# Patient Record
Sex: Female | Born: 1945 | Race: Black or African American | Hispanic: No | Marital: Married | Smoking: Former smoker
Health system: Southern US, Community
[De-identification: ages and names within clinical notes are randomized; demographics above are authoritative.]

## PROBLEM LIST (undated history)

## (undated) DIAGNOSIS — R251 Tremor, unspecified: Secondary | ICD-10-CM

## (undated) DIAGNOSIS — D649 Anemia, unspecified: Secondary | ICD-10-CM

## (undated) DIAGNOSIS — I509 Heart failure, unspecified: Secondary | ICD-10-CM

## (undated) DIAGNOSIS — E213 Hyperparathyroidism, unspecified: Secondary | ICD-10-CM

## (undated) DIAGNOSIS — F32A Depression, unspecified: Secondary | ICD-10-CM

## (undated) DIAGNOSIS — R002 Palpitations: Secondary | ICD-10-CM

## (undated) DIAGNOSIS — R0601 Orthopnea: Secondary | ICD-10-CM

## (undated) DIAGNOSIS — R0600 Dyspnea, unspecified: Secondary | ICD-10-CM

## (undated) DIAGNOSIS — R062 Wheezing: Secondary | ICD-10-CM

## (undated) DIAGNOSIS — F0390 Unspecified dementia without behavioral disturbance: Secondary | ICD-10-CM

## (undated) DIAGNOSIS — G2581 Restless legs syndrome: Secondary | ICD-10-CM

## (undated) DIAGNOSIS — K759 Inflammatory liver disease, unspecified: Secondary | ICD-10-CM

## (undated) DIAGNOSIS — K219 Gastro-esophageal reflux disease without esophagitis: Secondary | ICD-10-CM

## (undated) DIAGNOSIS — R609 Edema, unspecified: Secondary | ICD-10-CM

## (undated) DIAGNOSIS — M4800 Spinal stenosis, site unspecified: Secondary | ICD-10-CM

## (undated) DIAGNOSIS — E039 Hypothyroidism, unspecified: Secondary | ICD-10-CM

## (undated) DIAGNOSIS — R011 Cardiac murmur, unspecified: Secondary | ICD-10-CM

## (undated) DIAGNOSIS — F329 Major depressive disorder, single episode, unspecified: Secondary | ICD-10-CM

## (undated) DIAGNOSIS — M109 Gout, unspecified: Secondary | ICD-10-CM

## (undated) DIAGNOSIS — K5792 Diverticulitis of intestine, part unspecified, without perforation or abscess without bleeding: Secondary | ICD-10-CM

## (undated) DIAGNOSIS — N184 Chronic kidney disease, stage 4 (severe): Secondary | ICD-10-CM

## (undated) DIAGNOSIS — I2510 Atherosclerotic heart disease of native coronary artery without angina pectoris: Secondary | ICD-10-CM

## (undated) DIAGNOSIS — I209 Angina pectoris, unspecified: Secondary | ICD-10-CM

## (undated) DIAGNOSIS — M1990 Unspecified osteoarthritis, unspecified site: Secondary | ICD-10-CM

## (undated) DIAGNOSIS — R52 Pain, unspecified: Secondary | ICD-10-CM

## (undated) DIAGNOSIS — R569 Unspecified convulsions: Secondary | ICD-10-CM

## (undated) DIAGNOSIS — E785 Hyperlipidemia, unspecified: Secondary | ICD-10-CM

## (undated) DIAGNOSIS — I10 Essential (primary) hypertension: Secondary | ICD-10-CM

## (undated) DIAGNOSIS — K5790 Diverticulosis of intestine, part unspecified, without perforation or abscess without bleeding: Secondary | ICD-10-CM

## (undated) DIAGNOSIS — R1310 Dysphagia, unspecified: Secondary | ICD-10-CM

## (undated) HISTORY — PX: SHOULDER SURGERY: SHX246

## (undated) HISTORY — PX: ABDOMINAL HYSTERECTOMY: SHX81

## (undated) HISTORY — PX: EYE SURGERY: SHX253

## (undated) HISTORY — PX: CHOLECYSTECTOMY: SHX55

## (undated) HISTORY — PX: JOINT REPLACEMENT: SHX530

## (undated) HISTORY — PX: CHEST SURGERY: SHX595

## (undated) HISTORY — PX: CORONARY ANGIOPLASTY: SHX604

---

## 2015-07-19 ENCOUNTER — Other Ambulatory Visit: Payer: Self-pay | Admitting: Geriatric Medicine

## 2015-07-19 DIAGNOSIS — R519 Headache, unspecified: Secondary | ICD-10-CM

## 2015-07-19 DIAGNOSIS — R51 Headache: Principal | ICD-10-CM

## 2015-08-08 ENCOUNTER — Ambulatory Visit
Admission: RE | Admit: 2015-08-08 | Discharge: 2015-08-08 | Disposition: A | Discharge status: Home / Self Care | Payer: Medicare (Managed Care) | Source: Ambulatory Visit | Attending: Geriatric Medicine | Admitting: Geriatric Medicine

## 2015-09-12 ENCOUNTER — Ambulatory Visit
Admission: RE | Admit: 2015-09-12 | Discharge: 2015-09-12 | Disposition: A | Discharge status: Home / Self Care | Payer: Medicare (Managed Care) | Source: Ambulatory Visit | Attending: Geriatric Medicine | Admitting: Geriatric Medicine

## 2015-09-12 ENCOUNTER — Other Ambulatory Visit: Payer: Self-pay | Admitting: Geriatric Medicine

## 2015-09-12 DIAGNOSIS — I6782 Cerebral ischemia: Secondary | ICD-10-CM | POA: Diagnosis not present

## 2015-09-12 DIAGNOSIS — R519 Headache, unspecified: Secondary | ICD-10-CM

## 2015-09-12 DIAGNOSIS — R51 Headache: Principal | ICD-10-CM

## 2015-09-27 ENCOUNTER — Other Ambulatory Visit: Payer: Self-pay | Admitting: Geriatric Medicine

## 2015-09-27 DIAGNOSIS — N184 Chronic kidney disease, stage 4 (severe): Secondary | ICD-10-CM

## 2015-10-06 ENCOUNTER — Other Ambulatory Visit: Payer: Self-pay | Admitting: Geriatric Medicine

## 2015-10-06 DIAGNOSIS — Z0000 Encounter for general adult medical examination without abnormal findings: Secondary | ICD-10-CM

## 2015-10-17 ENCOUNTER — Ambulatory Visit
Admission: RE | Admit: 2015-10-17 | Discharge: 2015-10-17 | Disposition: A | Discharge status: Home / Self Care | Payer: Medicare (Managed Care) | Source: Ambulatory Visit | Attending: Geriatric Medicine | Admitting: Geriatric Medicine

## 2015-10-17 ENCOUNTER — Other Ambulatory Visit: Payer: Self-pay | Admitting: Geriatric Medicine

## 2015-10-17 DIAGNOSIS — N281 Cyst of kidney, acquired: Secondary | ICD-10-CM | POA: Diagnosis not present

## 2015-10-17 DIAGNOSIS — N184 Chronic kidney disease, stage 4 (severe): Secondary | ICD-10-CM | POA: Diagnosis not present

## 2015-11-30 ENCOUNTER — Ambulatory Visit
Admission: RE | Admit: 2015-11-30 | Discharge: 2015-11-30 | Disposition: A | Discharge status: Home / Self Care | Payer: Medicare (Managed Care) | Source: Ambulatory Visit | Attending: Geriatric Medicine | Admitting: Geriatric Medicine

## 2015-11-30 DIAGNOSIS — Z1231 Encounter for screening mammogram for malignant neoplasm of breast: Secondary | ICD-10-CM | POA: Diagnosis not present

## 2015-11-30 DIAGNOSIS — Z0000 Encounter for general adult medical examination without abnormal findings: Secondary | ICD-10-CM

## 2015-12-05 ENCOUNTER — Other Ambulatory Visit: Payer: Self-pay | Admitting: Geriatric Medicine

## 2015-12-05 DIAGNOSIS — E049 Nontoxic goiter, unspecified: Secondary | ICD-10-CM

## 2015-12-28 ENCOUNTER — Ambulatory Visit
Admission: RE | Admit: 2015-12-28 | Discharge: 2015-12-28 | Disposition: A | Discharge status: Home / Self Care | Payer: Medicare (Managed Care) | Source: Ambulatory Visit | Attending: Geriatric Medicine | Admitting: Geriatric Medicine

## 2015-12-28 DIAGNOSIS — I69391 Dysphagia following cerebral infarction: Secondary | ICD-10-CM | POA: Insufficient documentation

## 2015-12-28 DIAGNOSIS — E079 Disorder of thyroid, unspecified: Secondary | ICD-10-CM | POA: Insufficient documentation

## 2015-12-28 DIAGNOSIS — E049 Nontoxic goiter, unspecified: Secondary | ICD-10-CM

## 2016-01-04 ENCOUNTER — Encounter: Payer: Self-pay | Admitting: Emergency Medicine

## 2016-01-04 ENCOUNTER — Observation Stay
Admission: EM | Admit: 2016-01-04 | Discharge: 2016-01-06 | Disposition: A | Discharge status: Home / Self Care | Payer: Medicare (Managed Care) | Attending: Internal Medicine | Admitting: Internal Medicine

## 2016-01-04 ENCOUNTER — Emergency Department: Payer: Medicare (Managed Care)

## 2016-01-04 DIAGNOSIS — Z8249 Family history of ischemic heart disease and other diseases of the circulatory system: Secondary | ICD-10-CM | POA: Insufficient documentation

## 2016-01-04 DIAGNOSIS — Z809 Family history of malignant neoplasm, unspecified: Secondary | ICD-10-CM | POA: Insufficient documentation

## 2016-01-04 DIAGNOSIS — M542 Cervicalgia: Secondary | ICD-10-CM | POA: Diagnosis not present

## 2016-01-04 DIAGNOSIS — R531 Weakness: Secondary | ICD-10-CM | POA: Diagnosis not present

## 2016-01-04 DIAGNOSIS — G40909 Epilepsy, unspecified, not intractable, without status epilepticus: Secondary | ICD-10-CM | POA: Diagnosis not present

## 2016-01-04 DIAGNOSIS — R109 Unspecified abdominal pain: Secondary | ICD-10-CM

## 2016-01-04 DIAGNOSIS — Z7982 Long term (current) use of aspirin: Secondary | ICD-10-CM | POA: Insufficient documentation

## 2016-01-04 DIAGNOSIS — R55 Syncope and collapse: Secondary | ICD-10-CM | POA: Diagnosis not present

## 2016-01-04 DIAGNOSIS — I2510 Atherosclerotic heart disease of native coronary artery without angina pectoris: Secondary | ICD-10-CM | POA: Diagnosis not present

## 2016-01-04 DIAGNOSIS — K5792 Diverticulitis of intestine, part unspecified, without perforation or abscess without bleeding: Secondary | ICD-10-CM | POA: Diagnosis not present

## 2016-01-04 DIAGNOSIS — Z79899 Other long term (current) drug therapy: Secondary | ICD-10-CM | POA: Insufficient documentation

## 2016-01-04 DIAGNOSIS — M4800 Spinal stenosis, site unspecified: Secondary | ICD-10-CM | POA: Diagnosis not present

## 2016-01-04 DIAGNOSIS — R000 Tachycardia, unspecified: Secondary | ICD-10-CM | POA: Insufficient documentation

## 2016-01-04 DIAGNOSIS — Z841 Family history of disorders of kidney and ureter: Secondary | ICD-10-CM | POA: Insufficient documentation

## 2016-01-04 DIAGNOSIS — Z9049 Acquired absence of other specified parts of digestive tract: Secondary | ICD-10-CM | POA: Insufficient documentation

## 2016-01-04 DIAGNOSIS — R05 Cough: Secondary | ICD-10-CM | POA: Insufficient documentation

## 2016-01-04 DIAGNOSIS — F0390 Unspecified dementia without behavioral disturbance: Secondary | ICD-10-CM | POA: Diagnosis not present

## 2016-01-04 DIAGNOSIS — R809 Proteinuria, unspecified: Secondary | ICD-10-CM | POA: Insufficient documentation

## 2016-01-04 DIAGNOSIS — R110 Nausea: Secondary | ICD-10-CM

## 2016-01-04 DIAGNOSIS — N184 Chronic kidney disease, stage 4 (severe): Secondary | ICD-10-CM | POA: Diagnosis not present

## 2016-01-04 DIAGNOSIS — R1310 Dysphagia, unspecified: Secondary | ICD-10-CM | POA: Diagnosis not present

## 2016-01-04 DIAGNOSIS — N19 Unspecified kidney failure: Secondary | ICD-10-CM | POA: Diagnosis present

## 2016-01-04 DIAGNOSIS — E213 Hyperparathyroidism, unspecified: Secondary | ICD-10-CM | POA: Insufficient documentation

## 2016-01-04 DIAGNOSIS — Z888 Allergy status to other drugs, medicaments and biological substances status: Secondary | ICD-10-CM | POA: Insufficient documentation

## 2016-01-04 DIAGNOSIS — R918 Other nonspecific abnormal finding of lung field: Secondary | ICD-10-CM | POA: Insufficient documentation

## 2016-01-04 DIAGNOSIS — R001 Bradycardia, unspecified: Secondary | ICD-10-CM | POA: Diagnosis not present

## 2016-01-04 DIAGNOSIS — G8929 Other chronic pain: Secondary | ICD-10-CM | POA: Insufficient documentation

## 2016-01-04 DIAGNOSIS — Z881 Allergy status to other antibiotic agents status: Secondary | ICD-10-CM | POA: Insufficient documentation

## 2016-01-04 DIAGNOSIS — E782 Mixed hyperlipidemia: Secondary | ICD-10-CM | POA: Diagnosis not present

## 2016-01-04 DIAGNOSIS — I129 Hypertensive chronic kidney disease with stage 1 through stage 4 chronic kidney disease, or unspecified chronic kidney disease: Secondary | ICD-10-CM | POA: Diagnosis not present

## 2016-01-04 DIAGNOSIS — M5136 Other intervertebral disc degeneration, lumbar region: Secondary | ICD-10-CM | POA: Insufficient documentation

## 2016-01-04 DIAGNOSIS — M109 Gout, unspecified: Secondary | ICD-10-CM | POA: Insufficient documentation

## 2016-01-04 DIAGNOSIS — I081 Rheumatic disorders of both mitral and tricuspid valves: Secondary | ICD-10-CM | POA: Insufficient documentation

## 2016-01-04 DIAGNOSIS — E875 Hyperkalemia: Secondary | ICD-10-CM | POA: Diagnosis present

## 2016-01-04 DIAGNOSIS — Z823 Family history of stroke: Secondary | ICD-10-CM | POA: Insufficient documentation

## 2016-01-04 DIAGNOSIS — E785 Hyperlipidemia, unspecified: Secondary | ICD-10-CM | POA: Diagnosis not present

## 2016-01-04 DIAGNOSIS — I10 Essential (primary) hypertension: Secondary | ICD-10-CM | POA: Diagnosis present

## 2016-01-04 DIAGNOSIS — K219 Gastro-esophageal reflux disease without esophagitis: Secondary | ICD-10-CM | POA: Diagnosis not present

## 2016-01-04 DIAGNOSIS — Z90710 Acquired absence of both cervix and uterus: Secondary | ICD-10-CM | POA: Insufficient documentation

## 2016-01-04 HISTORY — DX: Essential (primary) hypertension: I10

## 2016-01-04 HISTORY — DX: Spinal stenosis, site unspecified: M48.00

## 2016-01-04 HISTORY — DX: Chronic kidney disease, stage 4 (severe): N18.4

## 2016-01-04 HISTORY — DX: Unspecified dementia, unspecified severity, without behavioral disturbance, psychotic disturbance, mood disturbance, and anxiety: F03.90

## 2016-01-04 HISTORY — DX: Hyperparathyroidism, unspecified: E21.3

## 2016-01-04 HISTORY — DX: Gout, unspecified: M10.9

## 2016-01-04 HISTORY — DX: Dysphagia, unspecified: R13.10

## 2016-01-04 HISTORY — DX: Atherosclerotic heart disease of native coronary artery without angina pectoris: I25.10

## 2016-01-04 HISTORY — DX: Diverticulitis of intestine, part unspecified, without perforation or abscess without bleeding: K57.92

## 2016-01-04 HISTORY — DX: Diverticulosis of intestine, part unspecified, without perforation or abscess without bleeding: K57.90

## 2016-01-04 HISTORY — DX: Anemia, unspecified: D64.9

## 2016-01-04 HISTORY — DX: Hyperlipidemia, unspecified: E78.5

## 2016-01-04 HISTORY — DX: Unspecified convulsions: R56.9

## 2016-01-04 LAB — URINALYSIS COMPLETE WITH MICROSCOPIC (ARMC ONLY)
BILIRUBIN URINE: NEGATIVE
GLUCOSE, UA: NEGATIVE mg/dL
Hgb urine dipstick: NEGATIVE
KETONES UR: NEGATIVE mg/dL
Leukocytes, UA: NEGATIVE
NITRITE: NEGATIVE
PROTEIN: 100 mg/dL — AB
SPECIFIC GRAVITY, URINE: 1.01 (ref 1.005–1.030)
pH: 6 (ref 5.0–8.0)

## 2016-01-04 LAB — COMPREHENSIVE METABOLIC PANEL
ALK PHOS: 113 U/L (ref 38–126)
ALT: 9 U/L — ABNORMAL LOW (ref 14–54)
ANION GAP: 7 (ref 5–15)
AST: 19 U/L (ref 15–41)
Albumin: 3.8 g/dL (ref 3.5–5.0)
BILIRUBIN TOTAL: 0.4 mg/dL (ref 0.3–1.2)
BUN: 43 mg/dL — ABNORMAL HIGH (ref 6–20)
CALCIUM: 8.5 mg/dL — AB (ref 8.9–10.3)
CO2: 23 mmol/L (ref 22–32)
Chloride: 109 mmol/L (ref 101–111)
Creatinine, Ser: 2.45 mg/dL — ABNORMAL HIGH (ref 0.44–1.00)
GFR calc non Af Amer: 19 mL/min — ABNORMAL LOW (ref >60)
GFR, EST AFRICAN AMERICAN: 22 mL/min — AB (ref >60)
Glucose, Bld: 88 mg/dL (ref 65–99)
POTASSIUM: 6.3 mmol/L — AB (ref 3.5–5.1)
SODIUM: 139 mmol/L (ref 135–145)
TOTAL PROTEIN: 7.5 g/dL (ref 6.5–8.1)

## 2016-01-04 LAB — CBC WITH DIFFERENTIAL/PLATELET
Basophils Absolute: 0 10*3/uL (ref 0–0.1)
Basophils Relative: 1 %
EOS ABS: 0.2 10*3/uL (ref 0–0.7)
EOS PCT: 3 %
HCT: 27 % — ABNORMAL LOW (ref 35.0–47.0)
Hemoglobin: 8.8 g/dL — ABNORMAL LOW (ref 12.0–16.0)
LYMPHS ABS: 1.6 10*3/uL (ref 1.0–3.6)
Lymphocytes Relative: 24 %
MCH: 29.9 pg (ref 26.0–34.0)
MCHC: 32.8 g/dL (ref 32.0–36.0)
MCV: 91.4 fL (ref 80.0–100.0)
MONOS PCT: 9 %
Monocytes Absolute: 0.6 10*3/uL (ref 0.2–0.9)
Neutro Abs: 4.2 10*3/uL (ref 1.4–6.5)
Neutrophils Relative %: 63 %
PLATELETS: 288 10*3/uL (ref 150–440)
RBC: 2.95 MIL/uL — ABNORMAL LOW (ref 3.80–5.20)
RDW: 13.3 % (ref 11.5–14.5)
WBC: 6.6 10*3/uL (ref 3.6–11.0)

## 2016-01-04 LAB — BASIC METABOLIC PANEL
Anion gap: 7 (ref 5–15)
BUN: 42 mg/dL — ABNORMAL HIGH (ref 6–20)
CALCIUM: 8.1 mg/dL — AB (ref 8.9–10.3)
CHLORIDE: 107 mmol/L (ref 101–111)
CO2: 22 mmol/L (ref 22–32)
CREATININE: 2.32 mg/dL — AB (ref 0.44–1.00)
GFR, EST AFRICAN AMERICAN: 23 mL/min — AB (ref >60)
GFR, EST NON AFRICAN AMERICAN: 20 mL/min — AB (ref >60)
Glucose, Bld: 100 mg/dL — ABNORMAL HIGH (ref 65–99)
Potassium: 5.4 mmol/L — ABNORMAL HIGH (ref 3.5–5.1)
SODIUM: 136 mmol/L (ref 135–145)

## 2016-01-04 LAB — LIPASE, BLOOD: LIPASE: 28 U/L (ref 11–51)

## 2016-01-04 LAB — TROPONIN I

## 2016-01-04 MED ORDER — POLYETHYLENE GLYCOL 3350 17 G PO PACK: 17.0000 g | PACK | Freq: Every day | ORAL | Status: DC | PRN

## 2016-01-04 MED ORDER — SODIUM BICARBONATE 650 MG PO TABS
650.0000 mg | ORAL_TABLET | Freq: Two times a day (BID) | ORAL | Status: DC
  Administered 2016-01-05 – 2016-01-06 (×4): 650 mg via ORAL
  Filled 2016-01-04 (×4): qty 1

## 2016-01-04 MED ORDER — PROMETHAZINE HCL 25 MG/ML IJ SOLN
12.5000 mg | Freq: Once | INTRAMUSCULAR | Status: AC
  Administered 2016-01-04: 12.5 mg via INTRAVENOUS
  Filled 2016-01-04: qty 1

## 2016-01-04 MED ORDER — PANTOPRAZOLE SODIUM 40 MG PO TBEC
40.0000 mg | DELAYED_RELEASE_TABLET | Freq: Every day | ORAL | Status: DC
  Administered 2016-01-05 – 2016-01-06 (×2): 40 mg via ORAL
  Filled 2016-01-04 (×2): qty 1

## 2016-01-04 MED ORDER — SODIUM CHLORIDE 0.9% FLUSH
3.0000 mL | Freq: Two times a day (BID) | INTRAVENOUS | Status: DC
  Administered 2016-01-05 (×3): 3 mL via INTRAVENOUS

## 2016-01-04 MED ORDER — ASPIRIN EC 81 MG PO TBEC
81.0000 mg | DELAYED_RELEASE_TABLET | Freq: Every day | ORAL | Status: DC
  Administered 2016-01-05 – 2016-01-06 (×2): 81 mg via ORAL
  Filled 2016-01-04 (×2): qty 1

## 2016-01-04 MED ORDER — HEPARIN SODIUM (PORCINE) 5000 UNIT/ML IJ SOLN
5000.0000 [IU] | Freq: Three times a day (TID) | INTRAMUSCULAR | Status: DC
  Administered 2016-01-05 – 2016-01-06 (×5): 5000 [IU] via SUBCUTANEOUS
  Filled 2016-01-04 (×5): qty 1

## 2016-01-04 MED ORDER — CARVEDILOL 12.5 MG PO TABS
25.0000 mg | ORAL_TABLET | Freq: Two times a day (BID) | ORAL | Status: DC
  Administered 2016-01-05 – 2016-01-06 (×3): 25 mg via ORAL
  Filled 2016-01-04 (×3): qty 2

## 2016-01-04 MED ORDER — LEVETIRACETAM 500 MG PO TABS
500.0000 mg | ORAL_TABLET | Freq: Two times a day (BID) | ORAL | Status: DC
  Administered 2016-01-05 – 2016-01-06 (×4): 500 mg via ORAL
  Filled 2016-01-04 (×4): qty 1

## 2016-01-04 MED ORDER — IOHEXOL 240 MG/ML SOLN
25.0000 mL | Freq: Once | INTRAMUSCULAR | Status: AC | PRN
  Administered 2016-01-04: 50 mL via ORAL

## 2016-01-04 MED ORDER — PROMETHAZINE HCL 25 MG/ML IJ SOLN: 12.5000 mg | Freq: Once | INTRAMUSCULAR | Status: DC

## 2016-01-04 MED ORDER — AMLODIPINE BESYLATE 5 MG PO TABS
5.0000 mg | ORAL_TABLET | Freq: Every day | ORAL | Status: DC
  Administered 2016-01-05 – 2016-01-06 (×2): 5 mg via ORAL
  Filled 2016-01-04 (×2): qty 1

## 2016-01-04 MED ORDER — SODIUM CHLORIDE 0.9 % IV BOLUS (SEPSIS)
1000.0000 mL | Freq: Once | INTRAVENOUS | Status: AC
  Administered 2016-01-04: 1000 mL via INTRAVENOUS

## 2016-01-04 MED ORDER — MORPHINE SULFATE (PF) 4 MG/ML IV SOLN
4.0000 mg | Freq: Once | INTRAVENOUS | Status: AC
  Administered 2016-01-04: 4 mg via INTRAVENOUS
  Filled 2016-01-04: qty 1

## 2016-01-04 MED ORDER — ACETAMINOPHEN 650 MG RE SUPP: 650.0000 mg | Freq: Four times a day (QID) | RECTAL | Status: DC | PRN

## 2016-01-04 MED ORDER — SODIUM POLYSTYRENE SULFONATE 15 GM/60ML PO SUSP
30.0000 g | Freq: Once | ORAL | Status: AC
  Administered 2016-01-04: 30 g via ORAL
  Filled 2016-01-04: qty 120

## 2016-01-04 MED ORDER — LISINOPRIL 5 MG PO TABS
2.5000 mg | ORAL_TABLET | Freq: Every day | ORAL | Status: DC
  Administered 2016-01-05: 2.5 mg via ORAL
  Filled 2016-01-04: qty 1

## 2016-01-04 MED ORDER — SENNOSIDES-DOCUSATE SODIUM 8.6-50 MG PO TABS: 2.0000 | ORAL_TABLET | Freq: Every day | ORAL | Status: DC | PRN

## 2016-01-04 MED ORDER — SODIUM CHLORIDE 0.9 % IV BOLUS (SEPSIS): 500.0000 mL | Freq: Once | INTRAVENOUS | Status: DC

## 2016-01-04 MED ORDER — ATORVASTATIN CALCIUM 20 MG PO TABS
20.0000 mg | ORAL_TABLET | Freq: Every day | ORAL | Status: DC
  Administered 2016-01-05: 20 mg via ORAL
  Filled 2016-01-04: qty 1

## 2016-01-04 MED ORDER — ISOSORBIDE MONONITRATE ER 30 MG PO TB24
30.0000 mg | ORAL_TABLET | Freq: Every day | ORAL | Status: DC
  Administered 2016-01-05 – 2016-01-06 (×2): 30 mg via ORAL
  Filled 2016-01-04 (×2): qty 1

## 2016-01-04 MED ORDER — DULOXETINE HCL 30 MG PO CPEP
30.0000 mg | ORAL_CAPSULE | Freq: Every day | ORAL | Status: DC
  Administered 2016-01-05 – 2016-01-06 (×2): 30 mg via ORAL
  Filled 2016-01-04 (×2): qty 1

## 2016-01-04 MED ORDER — OXYCODONE HCL 5 MG PO TABS
5.0000 mg | ORAL_TABLET | Freq: Four times a day (QID) | ORAL | Status: DC | PRN
  Administered 2016-01-05 (×2): 5 mg via ORAL
  Filled 2016-01-04 (×2): qty 1

## 2016-01-04 MED ORDER — FEBUXOSTAT 40 MG PO TABS
40.0000 mg | ORAL_TABLET | Freq: Every day | ORAL | Status: DC
  Administered 2016-01-05 – 2016-01-06 (×2): 40 mg via ORAL
  Filled 2016-01-04 (×2): qty 1

## 2016-01-04 MED ORDER — PROMETHAZINE HCL 25 MG/ML IJ SOLN
12.5000 mg | Freq: Once | INTRAMUSCULAR | Status: AC
  Administered 2016-01-04: 12.5 mg via INTRAMUSCULAR
  Filled 2016-01-04: qty 1

## 2016-01-04 MED ORDER — ACETAMINOPHEN 325 MG PO TABS
650.0000 mg | ORAL_TABLET | Freq: Four times a day (QID) | ORAL | Status: DC | PRN
  Administered 2016-01-06: 650 mg via ORAL
  Filled 2016-01-04: qty 2

## 2016-01-04 NOTE — ED Notes (Signed)
Attempted PIV x 2, unsuccessful.  Dr. Edd Fabian alerted.  Charge nurse at bedside looking for vein.

## 2016-01-04 NOTE — ED Notes (Signed)
Ambulated to BR with minimal assist.  Patient tolerated well.

## 2016-01-04 NOTE — ED Provider Notes (Signed)
[facility] Emergency Department Provider Note  ____________________________________________  Time seen: Approximately 3:52 PM  I have reviewed the triage vital signs and the nursing notes.   HISTORY  Chief Complaint Weakness and Nausea    HPI Sarah Hamilton is a 70 y.o. female with history of chronic kidney disease, dementia, coronary artery disease, hypertension, seizures who presents for evaluation of one day of nausea, gradual onset, constant since onset, currently severe, no modifying factors. She has also had right-sided abdominal pain since yesterday as well as epigastric abdominal pain. She was apparently at pace this morning and reported that she started feeling hot all over, was trying to take off her sweater and yelled for help because she "knew something was wrong". According to EMS, staff at [facility] program witnessed her have a near-syncopal event and her heart rate was 32 during the event. On EMS arrival, her vital signs were within normal limits. Patient denies any chest pain or difficulty breathing. She has had subjective fever and chills. No diarrhea. Mild dysuria.   Past Medical History  Diagnosis Date  . Hyperparathyroidism ([place])   . Dysphagia   . CKD (chronic kidney disease), stage IV ([place])   . Dementia   . Spinal stenosis   . Diverticulitis   . Diverticulosis   . Hyperlipidemia   . Anemia   . Seizures ([place])   . Coronary artery disease   . Hypertension   . Gout     There are no active problems to display for this patient.   No past surgical history on file.  Current Outpatient Rx  Name  Route  Sig  Dispense  Refill  . amLODipine (NORVASC) 5 MG tablet   Oral   Take 5 mg by mouth daily.         Marland Kitchen aspirin EC 81 MG tablet   Oral   Take 81 mg by mouth daily.         Marland Kitchen atorvastatin (LIPITOR) 20 MG tablet   Oral   Take 20 mg by mouth at bedtime.         . camphor-menthol (SARNA) lotion   Topical   Apply 1 application  topically 2 (two) times daily as needed for itching.         . capsaicin (ZOSTRIX) 0.025 % cream   Topical   Apply 1 application topically at bedtime as needed.         . carvedilol (COREG) 25 MG tablet   Oral   Take 25 mg by mouth 2 (two) times daily.         . Cranberry-Vitamin C-Probiotic (AZO CRANBERRY) 250-30 MG TABS   Oral   Take 2 capsules by mouth daily.         . diclofenac sodium (VOLTAREN) 1 % GEL   Topical   Apply 1 application topically 4 (four) times daily as needed.         . DULoxetine (CYMBALTA) 30 MG capsule   Oral   Take 1 capsule by mouth daily.         . febuxostat (ULORIC) 40 MG tablet   Oral   Take 40 mg by mouth daily.         Marland Kitchen gabapentin (NEURONTIN) 100 MG capsule   Oral   Take 1 capsule by mouth 2 (two) times daily.         . isosorbide mononitrate (IMDUR) 30 MG 24 hr tablet   Oral   Take 30 mg by mouth  daily.         . levETIRAcetam (KEPPRA) 500 MG tablet   Oral   Take 500 mg by mouth 2 (two) times daily.         Marland Kitchen lisinopril (PRINIVIL,ZESTRIL) 2.5 MG tablet   Oral   Take 2.5 mg by mouth daily.         Marland Kitchen losartan (COZAAR) 25 MG tablet   Oral   Take 25 mg by mouth daily.         . nitroGLYCERIN (NITROSTAT) 0.4 MG SL tablet   Sublingual   Place 0.4 mg under the tongue every 5 (five) minutes as needed.         . nystatin cream (MYCOSTATIN)   Topical   Apply 1 application topically 2 (two) times daily.         Marland Kitchen oxyCODONE-acetaminophen (PERCOCET/ROXICET) 5-325 MG tablet   Oral   Take 2 tablets by mouth 2 (two) times daily as needed.         . pantoprazole (PROTONIX) 40 MG tablet   Oral   Take 40 mg by mouth daily.         . polyethylene glycol (MIRALAX / GLYCOLAX) packet   Oral   Take 17 g by mouth daily as needed.         . promethazine (PHENERGAN) 12.5 MG tablet   Oral   Take 12.5 mg by mouth every 6 (six) hours as needed.         . senna-docusate (SENOKOT-S) 8.6-50 MG tablet   Oral    Take 2 tablets by mouth daily as needed.         . sodium bicarbonate 325 MG tablet   Oral   Take 650 mg by mouth 2 (two) times daily.         . Vitamin D, Ergocalciferol, (DRISDOL) 50000 units CAPS capsule   Oral   Take 1 capsule by mouth once a week. On mondays           Allergies Pregabalin and Erythromycin  Family History  Problem Relation Age of Onset  . Breast cancer Neg Hx     Social History Social History  Substance Use Topics  . Smoking status: Never Smoker   . Smokeless tobacco: None  . Alcohol Use: None    Review of Systems Constitutional: +subjectibr fever/chills Eyes: No visual changes. ENT: No sore throat. Cardiovascular: Denies chest pain. Respiratory: Denies shortness of breath. Gastrointestinal: + abdominal pain.  +nausea, no vomiting.  No diarrhea.  No constipation. Genitourinary: Negative for dysuria. Musculoskeletal: Negative for back pain. Skin: Negative for rash. Neurological: Negative for headaches, focal weakness or numbness.  10-point ROS otherwise negative.  ____________________________________________   PHYSICAL EXAM:  VITAL SIGNS: ED Triage Vitals  Enc Vitals Group     BP 01/04/16 1543 103/62 mmHg     Pulse Rate 01/04/16 1543 66     Resp 01/04/16 1543 16     Temp 01/04/16 1543 97.9 F (36.6 C)     Temp Source 01/04/16 1543 Oral     SpO2 01/04/16 1543 97 %     Weight 01/04/16 1543 282 lb 9.6 oz (128.187 kg)     Height 01/04/16 1543 5\' 2"  (1.575 m)     Head Cir --      Peak Flow --      Pain Score 01/04/16 1543 0     Pain Loc --      Pain Edu? --  Excl. in [place]? --     Constitutional: Alert and oriented. Nontoxic-appearing and in no acute distress. Eyes: Conjunctivae are normal. PERRL. EOMI. Head: Atraumatic. Nose: No congestion/rhinnorhea. Mouth/Throat: Mucous membranes are moist.  Oropharynx non-erythematous. Neck: No stridor. Supple without meningismus. Cardiovascular: Normal rate, regular rhythm. Grossly  normal heart sounds.  Good peripheral circulation. Respiratory: Normal respiratory effort.  No retractions. Lungs CTAB. Gastrointestinal: Soft with tenderness to palpation all throughout the right abdomen. No CVA tenderness. Genitourinary: deferred Musculoskeletal: No lower extremity tenderness nor edema.  No joint effusions. Neurologic:  Normal speech and language. No gross focal neurologic deficits are appreciated.  Skin:  Skin is warm, dry and intact. No rash noted. Psychiatric: Mood and affect are normal. Speech and behavior are normal.  ____________________________________________   LABS (all labs ordered are listed, but only abnormal results are displayed)  Labs Reviewed  CBC WITH DIFFERENTIAL/PLATELET - Abnormal; Notable for the following:    RBC 2.95 (*)    Hemoglobin 8.8 (*)    HCT 27.0 (*)    All other components within normal limits  COMPREHENSIVE METABOLIC PANEL - Abnormal; Notable for the following:    Potassium 6.3 (*)    BUN 43 (*)    Creatinine, Ser 2.45 (*)    Calcium 8.5 (*)    ALT 9 (*)    GFR calc non Af Amer 19 (*)    GFR calc Af Amer 22 (*)    All other components within normal limits  TROPONIN I  LIPASE, BLOOD  URINALYSIS COMPLETEWITH MICROSCOPIC ([facility] ONLY)  BASIC METABOLIC PANEL   ____________________________________________  EKG  ED ECG REPORT I, Joanne Gavel, the attending physician, personally viewed and interpreted this ECG.   Date: 01/04/2016  EKG Time: 16:49  Rate: 62  Rhythm: normal sinus rhythm  Axis: normal  Intervals:none  ST&T Change: No acute ST elevation. Motion artifact.  ____________________________________________  RADIOLOGY  CXR IMPRESSION: 1. Mild enlargement of the cardiopericardial silhouette. There is upper zone pulmonary vascular prominence which could reflect pulmonary venous hypertension. Faint interstitial accentuation, query mild interstitial edema. 2. Mid thoracic wedge compression, most likely  chronic.   CT abdomen and pelvis IMPRESSION: 1. No acute abnormality of the abdomen and pelvis. 2. Normal appendix. 3. Colonic diverticulosis. 4. Status post cholecystectomy and hysterectomy. 5. Coronary artery disease. 6. Atherosclerosis of the abdominal aorta. 7. Distention of the urinary bladder. 8. Degenerative disc disease. 9. Chronic superior endplate fracture of L1. ____________________________________________   PROCEDURES  Procedure(s) performed: None  Critical Care performed: No  ____________________________________________   INITIAL IMPRESSION / ASSESSMENT AND PLAN / ED COURSE  Pertinent labs & imaging results that were available during my care of the patient were reviewed by me and considered in my medical decision making (see chart for details).  Sarah Hamilton is a 70 y.o. female with history of chronic kidney disease, dementia, coronary artery disease, hypertension, seizures who presents for evaluation of one day of nausea as well as right-sided abdominal pain. She also had an episode today where she nearly fainted, likely vasovagal presyncope given significant preceding prodrome. On exam, she is nontoxic appearing and in no acute distress. Vital signs stable, she is afebrile. She does have tenderness to palpation all throughout the right abdomen. Plan for screening labs, urinalysis, chest x-ray given her complaint of subjective fevers and chills, likely CT of the abdomen and pelvis. We'll treat her nausea and discomfort as needed. Reassess for disposition.  ----------------------------------------- 6:47 PM on 01/04/2016 ----------------------------------------- Labs reviewed. CBC notable  for anemia with hemoglobin 8.8. CMP with creatinine elevated at 2.45 which is worsening when compared to her baseline. Potassium is elevated at 6.3. No acute hyperkalemic EKG changes. We'll give IV fluids. We'll not give Kayexalate until CT of the abdomen and pelvis is available  and obstruction is ruled out. Troponin negative, normal lipase. I discussed the case with Dr. Sherlon Handing of nephrology.  ----------------------------------------- 9:20 PM on 01/04/2016 ----------------------------------------- Negative CT of the abdomen and pelvis. We'll give Kayexalate and discuss case with the hospitalist, Dr. Jannifer Franklin, for admission.  ____________________________________________   FINAL CLINICAL IMPRESSION(S) / ED DIAGNOSES  Final diagnoses:  Abdominal pain, unspecified abdominal location  Nausea  Vasovagal near syncope  Acute hyperkalemia  Renal failure      Joanne Gavel, MD 01/04/16 2123

## 2016-01-04 NOTE — ED Notes (Signed)
Patient c/o nausea x 1 day.  States she was feeling more nauseous this morning when she went to [facility] today.  EMS was called due to paitent having a "near syncopal" event.  Staff reported that patient's heart rate dropped down to 32.  Vital signs and heart rate were stable for EMS

## 2016-01-04 NOTE — H&P (Signed)
[facility] [place] NAME: Sarah Hamilton    MR#:  [id number]  DATE OF BIRTH:  Aug 03, 1946  DATE OF ADMISSION:  01/04/2016  PRIMARY CARE PHYSICIAN: No PCP Per Patient   REQUESTING/REFERRING PHYSICIAN: Edd Fabian, MD  CHIEF COMPLAINT:   Chief Complaint  Patient presents with  . Weakness  . Nausea    HISTORY OF PRESENT ILLNESS:  Sarah Hamilton  is a 70 y.o. female who presents after a syncopal episode. Patient was at a [facility] program today after a visit with the dentist for tooth extractions. She became acutely flushed, nauseated, felt very hot, was trying her sweater off and syncopized. Heart rate measured during the event showed her to be bradycardic in the 30s. She woke up after a brief period, did not have any postictal state, did not have any reported seizure activity during the event. She was brought to the hospital for evaluation. Her nausea has been persistent and started, improved only mildly by Zofran here in the ED. In the ED she was found to be hyperkalemic with a potassium of 6.3. She was treated with Kayexalate with potassium following to 5.4. Patient has known stage IV chronic kidney disease, follows with nephrology, is not on dialysis, still makes good urine. No urinary symptoms. Hospitals were called for admission.  PAST MEDICAL HISTORY:   Past Medical History  Diagnosis Date  . Hyperparathyroidism ([place])   . Dysphagia   . CKD (chronic kidney disease), stage IV ([place])   . Dementia   . Spinal stenosis   . Diverticulitis   . Diverticulosis   . Hyperlipidemia   . Anemia   . Seizures ([place])   . Coronary artery disease   . Hypertension   . Gout     PAST SURGICAL HISTORY:   Past Surgical History  Procedure Laterality Date  . Cholecystectomy    . Abdominal hysterectomy    . Shoulder surgery    . Joint replacement      SOCIAL HISTORY:   Social History  Substance Use Topics  . Smoking status: Never Smoker   .  Smokeless tobacco: Not on file  . Alcohol Use: No    FAMILY HISTORY:   Family History  Problem Relation Age of Onset  . CAD Mother   . Hypertension Mother   . Stroke Mother   . Kidney failure Mother   . Cancer Father   . Kidney failure Brother   . Kidney failure Son     DRUG ALLERGIES:   Allergies  Allergen Reactions  . Pregabalin     Other reaction(s): Unknown  . Erythromycin     MEDICATIONS AT HOME:   Prior to Admission medications   Medication Sig Start Date End Date Taking? Authorizing Provider  amLODipine (NORVASC) 5 MG tablet Take 5 mg by mouth daily. 05/12/14  Yes Historical Provider, MD  aspirin EC 81 MG tablet Take 81 mg by mouth daily.   Yes Historical Provider, MD  atorvastatin (LIPITOR) 20 MG tablet Take 20 mg by mouth at bedtime. 07/14/14  Yes Historical Provider, MD  camphor-menthol Timoteo Ace) lotion Apply 1 application topically 2 (two) times daily as needed for itching.   Yes Historical Provider, MD  capsaicin (ZOSTRIX) 0.025 % cream Apply 1 application topically at bedtime as needed.   Yes Historical Provider, MD  carvedilol (COREG) 25 MG tablet Take 25 mg by mouth 2 (two) times daily. 11/08/14  Yes Historical Provider, MD  Cranberry-Vitamin C-Probiotic (AZO CRANBERRY) 250-30  MG TABS Take 2 capsules by mouth daily.   Yes Historical Provider, MD  diclofenac sodium (VOLTAREN) 1 % GEL Apply 1 application topically 4 (four) times daily as needed. 08/17/15  Yes Historical Provider, MD  DULoxetine (CYMBALTA) 30 MG capsule Take 1 capsule by mouth daily.   Yes Historical Provider, MD  febuxostat (ULORIC) 40 MG tablet Take 40 mg by mouth daily. 11/16/14  Yes Historical Provider, MD  gabapentin (NEURONTIN) 100 MG capsule Take 1 capsule by mouth 2 (two) times daily. 05/25/15  Yes Historical Provider, MD  isosorbide mononitrate (IMDUR) 30 MG 24 hr tablet Take 30 mg by mouth daily.   Yes Historical Provider, MD  levETIRAcetam (KEPPRA) 500 MG tablet Take 500 mg by mouth 2 (two)  times daily.   Yes Historical Provider, MD  lisinopril (PRINIVIL,ZESTRIL) 2.5 MG tablet Take 2.5 mg by mouth daily.   Yes Historical Provider, MD  losartan (COZAAR) 25 MG tablet Take 25 mg by mouth daily.   Yes Historical Provider, MD  nitroGLYCERIN (NITROSTAT) 0.4 MG SL tablet Place 0.4 mg under the tongue every 5 (five) minutes as needed. 02/15/15  Yes Historical Provider, MD  nystatin cream (MYCOSTATIN) Apply 1 application topically 2 (two) times daily. 04/04/15  Yes Historical Provider, MD  oxyCODONE-acetaminophen (PERCOCET/ROXICET) 5-325 MG tablet Take 2 tablets by mouth 2 (two) times daily as needed. 12/10/14  Yes Historical Provider, MD  pantoprazole (PROTONIX) 40 MG tablet Take 40 mg by mouth daily. 01/04/15 01/04/16 Yes Historical Provider, MD  polyethylene glycol (MIRALAX / GLYCOLAX) packet Take 17 g by mouth daily as needed.   Yes Historical Provider, MD  promethazine (PHENERGAN) 12.5 MG tablet Take 12.5 mg by mouth every 6 (six) hours as needed. 11/08/14  Yes Historical Provider, MD  senna-docusate (SENOKOT-S) 8.6-50 MG tablet Take 2 tablets by mouth daily as needed. 02/27/15  Yes Historical Provider, MD  sodium bicarbonate 325 MG tablet Take 650 mg by mouth 2 (two) times daily. 09/28/15  Yes Historical Provider, MD  Vitamin D, Ergocalciferol, (DRISDOL) 50000 units CAPS capsule Take 1 capsule by mouth once a week. On mondays 12/31/13  Yes Historical Provider, MD    REVIEW OF SYSTEMS:  Review of Systems  Constitutional: Negative for fever, chills, weight loss and malaise/fatigue.  HENT: Negative for ear pain, hearing loss and tinnitus.   Eyes: Negative for blurred vision, double vision, pain and redness.  Respiratory: Negative for cough, hemoptysis and shortness of breath.   Cardiovascular: Negative for chest pain, palpitations, orthopnea and leg swelling.  Gastrointestinal: Negative for nausea, vomiting, abdominal pain, diarrhea and constipation.  Genitourinary: Negative for dysuria, frequency  and hematuria.  Musculoskeletal: Negative for back pain, joint pain and neck pain.  Skin:       No acne, rash, or lesions  Neurological: Positive for loss of consciousness. Negative for dizziness, tremors, focal weakness and weakness.  Endo/Heme/Allergies: Negative for polydipsia. Does not bruise/bleed easily.  Psychiatric/Behavioral: Negative for depression. The patient is not nervous/anxious and does not have insomnia.      VITAL SIGNS:   Filed Vitals:   01/04/16 1543 01/04/16 2000 01/04/16 2130 01/04/16 2200  BP: 103/62 146/69 128/71 133/87  Pulse: 66 70 72 66  Temp: 97.9 F (36.6 C)     TempSrc: Oral     Resp: 16 15 15 16   Height: 5\' 2"  (1.575 m)     Weight: 128.187 kg (282 lb 9.6 oz)     SpO2: 97% 98% 94% 95%   Wt Readings from Last  3 Encounters:  01/04/16 128.187 kg (282 lb 9.6 oz)    PHYSICAL EXAMINATION:  Physical Exam  Vitals reviewed. Constitutional: She is oriented to person, place, and time. She appears well-developed and well-nourished. No distress.  HENT:  Head: Normocephalic and atraumatic.  Mouth/Throat: Oropharynx is clear and moist.  Lower frontal tooth extractions visible  Eyes: Conjunctivae and EOM are normal. Pupils are equal, round, and reactive to light. No scleral icterus.  Neck: Normal range of motion. Neck supple. No JVD present. No thyromegaly present.  Cardiovascular: Normal rate, regular rhythm and intact distal pulses.  Exam reveals no gallop and no friction rub.   No murmur heard. Respiratory: Effort normal and breath sounds normal. No respiratory distress. She has no wheezes. She has no rales.  GI: Soft. Bowel sounds are normal. She exhibits no distension. There is no tenderness.  Musculoskeletal: Normal range of motion. She exhibits no edema.  No arthritis, no gout  Lymphadenopathy:    She has no cervical adenopathy.  Neurological: She is alert and oriented to person, place, and time. No cranial nerve deficit.  No dysarthria, no aphasia   Skin: Skin is warm and dry. No rash noted. No erythema.  Psychiatric: She has a normal mood and affect. Her behavior is normal. Judgment and thought content normal.    LABORATORY PANEL:   CBC  Recent Labs Lab 01/04/16 1749  WBC 6.6  HGB 8.8*  HCT 27.0*  PLT 288   ------------------------------------------------------------------------------------------------------------------  Chemistries   Recent Labs Lab 01/04/16 1749 01/04/16 2104  NA 139 136  K 6.3* 5.4*  CL 109 107  CO2 23 22  GLUCOSE 88 100*  BUN 43* 42*  CREATININE 2.45* 2.32*  CALCIUM 8.5* 8.1*  AST 19  --   ALT 9*  --   ALKPHOS 113  --   BILITOT 0.4  --    ------------------------------------------------------------------------------------------------------------------  Cardiac Enzymes  Recent Labs Lab 01/04/16 1749  TROPONINI <0.03   ------------------------------------------------------------------------------------------------------------------  RADIOLOGY:  Ct Abdomen Pelvis Wo Contrast  01/04/2016  CLINICAL DATA:  Patient c/o nausea x 1 day. States she was feeling more nauseated this morning when she went to [facility] today. EMS was called due to patient having a "near syncopal" event. Heart rate dropped to 32 beats per minute. EXAM: CT ABDOMEN AND PELVIS WITHOUT CONTRAST TECHNIQUE: Multidetector CT imaging of the abdomen and pelvis was performed following the standard protocol without IV contrast. COMPARISON:  None. FINDINGS: Lower chest: No pulmonary nodules, pleural effusions, or infiltrates. There is atherosclerotic calcification of the coronary arteries. Heart size is normal. Upper abdomen: No focal abnormality identified within the liver, spleen, pancreas, or adrenal glands. Gallbladder is surgically absent. The kidneys are normal in appearance. No hydronephrosis. No intrarenal or ureteral stones. Gastrointestinal tract: The stomach and small bowel loops are normal in appearance. The appendix is well  seen and has a normal appearance. Colonic diverticulosis. Pelvis: Urinary bladder is significantly distended. Status post hysterectomy. No adnexal mass. No free pelvic fluid. Retroperitoneum: There is mild atherosclerotic calcification of the abdominal aorta. No aneurysm. Abdominal wall: Unremarkable. Osseous structures: There is moderate degenerative change of the lower thoracic and lumbar spine. Superior endplate fracture of L1 is likely chronic. No suspicious lytic or blastic lesions are identified. IMPRESSION: 1. No acute abnormality of the abdomen and pelvis. 2. Normal appendix. 3. Colonic diverticulosis. 4. Status post cholecystectomy and hysterectomy. 5. Coronary artery disease. 6. Atherosclerosis of the abdominal aorta. 7. Distention of the urinary bladder. 8. Degenerative disc disease. 9.  Chronic superior endplate fracture of L1. Electronically Signed   By: Nolon Nations M.D.   On: 01/04/2016 21:18   Dg Chest 2 View  01/04/2016  CLINICAL DATA:  Patient c/o nausea x 1 day. States she was feeling more nauseous this morning when she went to [facility] today. EMS was called due to paitent having a "near syncopal" event. Bradycardia. EXAM: CHEST  2 VIEW COMPARISON:  None. FINDINGS: Low lung volumes are present, causing crowding of the pulmonary vasculature. Mild enlargement of the cardiopericardial silhouette with interstitial accentuation. Borderline airway thickening. Atherosclerotic aortic arch. Left proximal humeral prosthesis. Body habitus reduces diagnostic sensitivity and specificity. Suspected mid thoracic wedge compression, most likely chronic. IMPRESSION: 1. Mild enlargement of the cardiopericardial silhouette. There is upper zone pulmonary vascular prominence which could reflect pulmonary venous hypertension. Faint interstitial accentuation, query mild interstitial edema. 2. Mid thoracic wedge compression, most likely chronic. Electronically Signed   By: Van Clines M.D.   On: 01/04/2016 16:26     EKG:   Orders placed or performed during the hospital encounter of 01/04/16  . ED EKG  . ED EKG    IMPRESSION AND PLAN:  Principal Problem:   Syncope - based on history seems to been a vagal syncope. No arrhythmias on monitor so far. Admit for observation tonight with telemetry, get echocardiogram in morning, trend troponins, get cardiology consult. Active Problems:   Hyperkalemia - good response to Kayexalate, will give another 15 g tonight, recheck potassium in the morning.   HTN (hypertension) - hemodynamically stable, continue home meds   CAD (coronary artery disease) - continue home meds for this   GERD (gastroesophageal reflux disease) - home dose PPI   HLD (hyperlipidemia) - continue home medications  All the records are reviewed and case discussed with ED provider. Management plans discussed with the patient and/or family.  DVT PROPHYLAXIS: SubQ heparin  GI PROPHYLAXIS: PPI  ADMISSION STATUS: Observation  CODE STATUS: Full Code Status History    This patient does not have a recorded code status. Please follow your organizational policy for patients in this situation.      TOTAL TIME TAKING CARE OF THIS PATIENT: 45 minutes.    Daiquan Resnik [place] 01/04/2016, 10:57 PM  [facility]  Office  [phone]  CC: Primary care physician; No PCP Per Patient

## 2016-01-05 ENCOUNTER — Observation Stay
Admit: 2016-01-05 | Discharge: 2016-01-05 | Disposition: A | Discharge status: Home / Self Care | Payer: Medicare (Managed Care) | Attending: Internal Medicine | Admitting: Internal Medicine

## 2016-01-05 DIAGNOSIS — R55 Syncope and collapse: Secondary | ICD-10-CM | POA: Diagnosis not present

## 2016-01-05 LAB — CBC
HCT: 26.9 % — ABNORMAL LOW (ref 35.0–47.0)
Hemoglobin: 9 g/dL — ABNORMAL LOW (ref 12.0–16.0)
MCH: 30.4 pg (ref 26.0–34.0)
MCHC: 33.4 g/dL (ref 32.0–36.0)
MCV: 91.2 fL (ref 80.0–100.0)
Platelets: 276 10*3/uL (ref 150–440)
RBC: 2.95 MIL/uL — ABNORMAL LOW (ref 3.80–5.20)
RDW: 13.4 % (ref 11.5–14.5)
WBC: 6.5 10*3/uL (ref 3.6–11.0)

## 2016-01-05 LAB — BASIC METABOLIC PANEL
Anion gap: 6 (ref 5–15)
BUN: 39 mg/dL — AB (ref 6–20)
CALCIUM: 8.1 mg/dL — AB (ref 8.9–10.3)
CO2: 25 mmol/L (ref 22–32)
Chloride: 110 mmol/L (ref 101–111)
Creatinine, Ser: 2.34 mg/dL — ABNORMAL HIGH (ref 0.44–1.00)
GFR calc Af Amer: 23 mL/min — ABNORMAL LOW (ref >60)
GFR, EST NON AFRICAN AMERICAN: 20 mL/min — AB (ref >60)
GLUCOSE: 109 mg/dL — AB (ref 65–99)
POTASSIUM: 5.2 mmol/L — AB (ref 3.5–5.1)
Sodium: 141 mmol/L (ref 135–145)

## 2016-01-05 LAB — BASIC METABOLIC PANEL WITH GFR
Anion gap: 6 (ref 5–15)
BUN: 39 mg/dL — ABNORMAL HIGH (ref 6–20)
CO2: 23 mmol/L (ref 22–32)
Calcium: 8.2 mg/dL — ABNORMAL LOW (ref 8.9–10.3)
Chloride: 110 mmol/L (ref 101–111)
Creatinine, Ser: 2.35 mg/dL — ABNORMAL HIGH (ref 0.44–1.00)
GFR calc Af Amer: 23 mL/min — ABNORMAL LOW
GFR calc non Af Amer: 20 mL/min — ABNORMAL LOW
Glucose, Bld: 84 mg/dL (ref 65–99)
Potassium: 5.4 mmol/L — ABNORMAL HIGH (ref 3.5–5.1)
Sodium: 139 mmol/L (ref 135–145)

## 2016-01-05 LAB — ECHOCARDIOGRAM COMPLETE
Height: 62 in
Weight: 4544 [oz_av]

## 2016-01-05 LAB — TROPONIN I: Troponin I: <0.03 ng/mL

## 2016-01-05 MED ORDER — SODIUM CHLORIDE 0.9 % IV BOLUS (SEPSIS)
500.0000 mL | Freq: Once | INTRAVENOUS | Status: AC
  Administered 2016-01-05: 500 mL via INTRAVENOUS

## 2016-01-05 MED ORDER — OXYCODONE HCL 5 MG PO TABS
10.0000 mg | ORAL_TABLET | Freq: Four times a day (QID) | ORAL | Status: DC | PRN
  Administered 2016-01-05 – 2016-01-06 (×4): 10 mg via ORAL
  Filled 2016-01-05 (×4): qty 2

## 2016-01-05 MED ORDER — ONDANSETRON HCL 4 MG/2ML IJ SOLN
4.0000 mg | Freq: Four times a day (QID) | INTRAMUSCULAR | Status: DC | PRN
  Administered 2016-01-05 – 2016-01-06 (×2): 4 mg via INTRAVENOUS
  Filled 2016-01-05 (×2): qty 2

## 2016-01-05 MED ORDER — SODIUM POLYSTYRENE SULFONATE 15 GM/60ML PO SUSP
30.0000 g | Freq: Once | ORAL | Status: AC
  Administered 2016-01-05: 30 g via ORAL
  Filled 2016-01-05: qty 120

## 2016-01-05 NOTE — Progress Notes (Signed)
Patient ID: Sarah Hamilton, female   DOB: June 20, 1946, 70 y.o.   MRN: [id number] [facility] PROGRESS NOTE  Sarah Hamilton [id number] DOB: 29-Mar-1946 DOA: 01/04/2016 PCP: No PCP Per Patient  HPI/Subjective: Patient still feeling very weak. Having a lot of diarrhea with the Kayexalate. She said it took people to help her to get to the commode. The last 3 days she's been lying in bed. Yesterday she had 3 teeth pulled. In the ER she was found to be hyperkalemic. Patient has chronic right shoulder and neck pain  Objective: Filed Vitals:   01/05/16 0825 01/05/16 1136  BP: 124/44 130/51  Pulse: 79 78  Temp: 98.3 F (36.8 C) 97.6 F (36.4 C)  Resp: 20 18    Filed Weights   01/04/16 1543 01/05/16 0036 01/05/16 0417  Weight: 128.187 kg (282 lb 9.6 oz) 128.822 kg (284 lb) 128.822 kg (284 lb)    ROS: Review of Systems  Constitutional: Negative for fever and chills.  Eyes: Negative for blurred vision.  Respiratory: Negative for cough and shortness of breath.   Cardiovascular: Negative for chest pain.  Gastrointestinal: Negative for nausea, vomiting, abdominal pain, diarrhea and constipation.  Genitourinary: Negative for dysuria.  Musculoskeletal: Positive for joint pain.  Neurological: Positive for weakness. Negative for dizziness and headaches.   Exam: Physical Exam  Constitutional: She is oriented to person, place, and time.  HENT:  Nose: No mucosal edema.  Mouth/Throat: No oropharyngeal exudate or posterior oropharyngeal edema.  Eyes: Conjunctivae, EOM and lids are normal. Pupils are equal, round, and reactive to light.  Neck: No JVD present. Carotid bruit is not present. No edema present. No thyroid mass and no thyromegaly present.  Cardiovascular: S1 normal and S2 normal.  Exam reveals no gallop.   No murmur heard. Pulses:      Dorsalis pedis pulses are 2+ on the right side, and 2+ on the left side.  Respiratory: No respiratory distress. She has no wheezes. She  has no rhonchi. She has no rales.  GI: Soft. Bowel sounds are normal. There is no tenderness.  Musculoskeletal:       Right ankle: She exhibits swelling.       Left ankle: She exhibits swelling.  Right shoulder limited motion with secondary to pain. Left fifth finger recent fracture and bruising.  Lymphadenopathy:    She has no cervical adenopathy.  Neurological: She is alert and oriented to person, place, and time. No cranial nerve deficit.  Skin: Skin is warm. No rash noted. Nails show no clubbing.  Psychiatric: She has a normal mood and affect.      Data Reviewed: Basic Metabolic Panel:  Recent Labs Lab 01/04/16 1749 01/04/16 2104 01/05/16 0440 01/05/16 1316  NA 139 136 139 141  K 6.3* 5.4* 5.4* 5.2*  CL 109 107 110 110  CO2 23 22 23 25   GLUCOSE 88 100* 84 109*  BUN 43* 42* 39* 39*  CREATININE 2.45* 2.32* 2.35* 2.34*  CALCIUM 8.5* 8.1* 8.2* 8.1*   Liver Function Tests:  Recent Labs Lab 01/04/16 1749  AST 19  ALT 9*  ALKPHOS 113  BILITOT 0.4  PROT 7.5  ALBUMIN 3.8    Recent Labs Lab 01/04/16 1749  LIPASE 28   CBC:  Recent Labs Lab 01/04/16 1749 01/05/16 0440  WBC 6.6 6.5  NEUTROABS 4.2  --   HGB 8.8* 9.0*  HCT 27.0* 26.9*  MCV 91.4 91.2  PLT 288 276   Cardiac Enzymes:  Recent Labs Lab 01/04/16 1749  01/05/16 0027 01/05/16 0440  TROPONINI <0.03 <0.03 <0.03    Studies: Ct Abdomen Pelvis Wo Contrast  01/04/2016  CLINICAL DATA:  Patient c/o nausea x 1 day. States she was feeling more nauseated this morning when she went to [facility] today. EMS was called due to patient having a "near syncopal" event. Heart rate dropped to 32 beats per minute. EXAM: CT ABDOMEN AND PELVIS WITHOUT CONTRAST TECHNIQUE: Multidetector CT imaging of the abdomen and pelvis was performed following the standard protocol without IV contrast. COMPARISON:  None. FINDINGS: Lower chest: No pulmonary nodules, pleural effusions, or infiltrates. There is atherosclerotic calcification  of the coronary arteries. Heart size is normal. Upper abdomen: No focal abnormality identified within the liver, spleen, pancreas, or adrenal glands. Gallbladder is surgically absent. The kidneys are normal in appearance. No hydronephrosis. No intrarenal or ureteral stones. Gastrointestinal tract: The stomach and small bowel loops are normal in appearance. The appendix is well seen and has a normal appearance. Colonic diverticulosis. Pelvis: Urinary bladder is significantly distended. Status post hysterectomy. No adnexal mass. No free pelvic fluid. Retroperitoneum: There is mild atherosclerotic calcification of the abdominal aorta. No aneurysm. Abdominal wall: Unremarkable. Osseous structures: There is moderate degenerative change of the lower thoracic and lumbar spine. Superior endplate fracture of L1 is likely chronic. No suspicious lytic or blastic lesions are identified. IMPRESSION: 1. No acute abnormality of the abdomen and pelvis. 2. Normal appendix. 3. Colonic diverticulosis. 4. Status post cholecystectomy and hysterectomy. 5. Coronary artery disease. 6. Atherosclerosis of the abdominal aorta. 7. Distention of the urinary bladder. 8. Degenerative disc disease. 9. Chronic superior endplate fracture of L1. Electronically Signed   By: Nolon Nations M.D.   On: 01/04/2016 21:18   Dg Chest 2 View  01/04/2016  CLINICAL DATA:  Patient c/o nausea x 1 day. States she was feeling more nauseous this morning when she went to [facility] today. EMS was called due to paitent having a "near syncopal" event. Bradycardia. EXAM: CHEST  2 VIEW COMPARISON:  None. FINDINGS: Low lung volumes are present, causing crowding of the pulmonary vasculature. Mild enlargement of the cardiopericardial silhouette with interstitial accentuation. Borderline airway thickening. Atherosclerotic aortic arch. Left proximal humeral prosthesis. Body habitus reduces diagnostic sensitivity and specificity. Suspected mid thoracic wedge compression, most  likely chronic. IMPRESSION: 1. Mild enlargement of the cardiopericardial silhouette. There is upper zone pulmonary vascular prominence which could reflect pulmonary venous hypertension. Faint interstitial accentuation, query mild interstitial edema. 2. Mid thoracic wedge compression, most likely chronic. Electronically Signed   By: Van Clines M.D.   On: 01/04/2016 16:26    Scheduled Meds: . amLODipine  5 mg Oral Daily  . aspirin EC  81 mg Oral Daily  . atorvastatin  20 mg Oral QHS  . carvedilol  25 mg Oral BID  . DULoxetine  30 mg Oral Daily  . febuxostat  40 mg Oral Daily  . heparin  5,000 Units Subcutaneous 3 times per day  . isosorbide mononitrate  30 mg Oral Daily  . levETIRAcetam  500 mg Oral BID  . pantoprazole  40 mg Oral Daily  . sodium bicarbonate  650 mg Oral BID  . sodium chloride  500 mL Intravenous Once  . sodium chloride flush  3 mL Intravenous Q12H    Assessment/Plan:  1. Hyperkalemia. Patient had 2 doses of Kayexalate and potassium still elevated. Likely this is secondary to acute on chronic renal failure. DC lisinopril. I will give a fluid bolus. 2. Acute on chronic kidney disease  stage IV. Give a fluid bolus hold lisinopril 3. Syncope. Could be multifactorial with hyperkalemia and possible arrhythmia versus pain with recent tooth extraction. Monitor overnight on telemetry. 4. Essential hypertension continue usual medications holding lisinopril 5. Seizure history on Keppra 6. Gastroesophageal reflux disease on Protonix 7. History of spinal stenosis 8. Hyperlipidemia unspecified on atorvastatin  Code Status:     Code Status Orders        Start     Ordered   01/04/16 2353  Full code   Continuous     01/04/16 2352    Code Status History    Date Active Date Inactive Code Status Order ID Comments User Context   This patient has a current code status but no historical code status.    Advance Directive Documentation        Most Recent Value   Type of  Advance Directive  Healthcare Power of Attorney   Pre-existing out of facility DNR order (yellow form or pink MOST form)     "MOST" Form in Place?       Disposition Plan: Home soon  Consultants:  Nephrology  Cardiology  Time spent: 25 minutes  Loletha Grayer  [facility]

## 2016-01-05 NOTE — Consult Note (Signed)
[facility] Consultation Note  Patient ID: Sarah Hamilton, MRN: [id number], DOB/AGE: 71/14/1947 71 y.o. Admit date: 01/04/2016   Date of Consult: 01/05/2016 Primary Physician: No PCP Per Patient Primary Cardiologist: None  Chief Complaint:  Chief Complaint  Patient presents with  . Weakness  . Nausea   Reason for Consult: syncope  HPI: 70 y.o. female with known chronic kidney disease stage IV essential hypertension coronary artery disease mixed hyperlipidemia with the recent need in to the extraction. During her tooth extraction she received multiple medications and injections for which she made her feel less than perfect. She had some nausea and hot feeling and had some apparent bradycardia then tachycardia. During that period of time she also had a syncopal episode. The patient had sort of traumatic experience and therefore likely has had a syncopal episode due to this concern. It is more situational at this stage. Troponin levels are normal and EKG shows sinus tachycardia and there is currently no evidence of myocardial infarction. The patient is feeling much better today and has no further symptoms of cardiovascular concerns with telemetry showing normal sinus rhythm. She has had reasonable blood pressure control with appropriate medication management listed below. High intensity cholesterol therapy has been given and the patient has had stable  Past Medical History  Diagnosis Date  . Hyperparathyroidism ([place])   . Dysphagia   . CKD (chronic kidney disease), stage IV ([place])   . Dementia   . Spinal stenosis   . Diverticulitis   . Diverticulosis   . Hyperlipidemia   . Anemia   . Seizures ([place])   . Coronary artery disease   . Hypertension   . Gout       Surgical History:  Past Surgical History  Procedure Laterality Date  . Cholecystectomy    . Abdominal hysterectomy    . Shoulder surgery    . Joint replacement       Home Meds: Prior to Admission medications    Medication Sig Start Date End Date Taking? Authorizing Provider  amLODipine (NORVASC) 5 MG tablet Take 5 mg by mouth daily. 05/12/14  Yes Historical Provider, MD  aspirin EC 81 MG tablet Take 81 mg by mouth daily.   Yes Historical Provider, MD  atorvastatin (LIPITOR) 20 MG tablet Take 20 mg by mouth at bedtime. 07/14/14  Yes Historical Provider, MD  camphor-menthol Timoteo Ace) lotion Apply 1 application topically 2 (two) times daily as needed for itching.   Yes Historical Provider, MD  capsaicin (ZOSTRIX) 0.025 % cream Apply 1 application topically at bedtime as needed.   Yes Historical Provider, MD  carvedilol (COREG) 25 MG tablet Take 25 mg by mouth 2 (two) times daily. 11/08/14  Yes Historical Provider, MD  Cranberry-Vitamin C-Probiotic (AZO CRANBERRY) 250-30 MG TABS Take 2 capsules by mouth daily.   Yes Historical Provider, MD  diclofenac sodium (VOLTAREN) 1 % GEL Apply 1 application topically 4 (four) times daily as needed. 08/17/15  Yes Historical Provider, MD  DULoxetine (CYMBALTA) 30 MG capsule Take 1 capsule by mouth daily.   Yes Historical Provider, MD  febuxostat (ULORIC) 40 MG tablet Take 40 mg by mouth daily. 11/16/14  Yes Historical Provider, MD  gabapentin (NEURONTIN) 100 MG capsule Take 1 capsule by mouth 2 (two) times daily. 05/25/15  Yes Historical Provider, MD  isosorbide mononitrate (IMDUR) 30 MG 24 hr tablet Take 30 mg by mouth daily.   Yes Historical Provider, MD  levETIRAcetam (KEPPRA) 500 MG tablet Take 500 mg by mouth 2 (  two) times daily.   Yes Historical Provider, MD  lisinopril (PRINIVIL,ZESTRIL) 2.5 MG tablet Take 2.5 mg by mouth daily.   Yes Historical Provider, MD  losartan (COZAAR) 25 MG tablet Take 25 mg by mouth daily.   Yes Historical Provider, MD  nitroGLYCERIN (NITROSTAT) 0.4 MG SL tablet Place 0.4 mg under the tongue every 5 (five) minutes as needed. 02/15/15  Yes Historical Provider, MD  nystatin cream (MYCOSTATIN) Apply 1 application topically 2 (two) times daily.  04/04/15  Yes Historical Provider, MD  oxyCODONE-acetaminophen (PERCOCET/ROXICET) 5-325 MG tablet Take 2 tablets by mouth 2 (two) times daily as needed. 12/10/14  Yes Historical Provider, MD  pantoprazole (PROTONIX) 40 MG tablet Take 40 mg by mouth daily. 01/04/15 01/04/16 Yes Historical Provider, MD  polyethylene glycol (MIRALAX / GLYCOLAX) packet Take 17 g by mouth daily as needed.   Yes Historical Provider, MD  promethazine (PHENERGAN) 12.5 MG tablet Take 12.5 mg by mouth every 6 (six) hours as needed. 11/08/14  Yes Historical Provider, MD  senna-docusate (SENOKOT-S) 8.6-50 MG tablet Take 2 tablets by mouth daily as needed. 02/27/15  Yes Historical Provider, MD  sodium bicarbonate 325 MG tablet Take 650 mg by mouth 2 (two) times daily. 09/28/15  Yes Historical Provider, MD  Vitamin D, Ergocalciferol, (DRISDOL) 50000 units CAPS capsule Take 1 capsule by mouth once a week. On mondays 12/31/13  Yes Historical Provider, MD    Inpatient Medications:  . amLODipine  5 mg Oral Daily  . aspirin EC  81 mg Oral Daily  . atorvastatin  20 mg Oral QHS  . carvedilol  25 mg Oral BID  . DULoxetine  30 mg Oral Daily  . febuxostat  40 mg Oral Daily  . heparin  5,000 Units Subcutaneous 3 times per day  . isosorbide mononitrate  30 mg Oral Daily  . levETIRAcetam  500 mg Oral BID  . lisinopril  2.5 mg Oral Daily  . pantoprazole  40 mg Oral Daily  . sodium bicarbonate  650 mg Oral BID  . sodium chloride flush  3 mL Intravenous Q12H      Allergies:  Allergies  Allergen Reactions  . Pregabalin     Other reaction(s): Unknown  . Erythromycin     Social History   Social History  . Marital Status: Married    Spouse Name: N/A  . Number of Children: N/A  . Years of Education: N/A   Occupational History  . Not on file.   Social History Main Topics  . Smoking status: Never Smoker   . Smokeless tobacco: Not on file  . Alcohol Use: No  . Drug Use: No  . Sexual Activity: Not on file   Other Topics Concern  .  Not on file   Social History Narrative     Family History  Problem Relation Age of Onset  . CAD Mother   . Hypertension Mother   . Stroke Mother   . Kidney failure Mother   . Cancer Father   . Kidney failure Brother   . Kidney failure Son      Review of Systems Positive for Syncope Negative for: General:  chills, fever, night sweats or weight changes.  Cardiovascular: PND orthopnea positive for syncope dizziness  Dermatological skin lesions rashes Respiratory: Cough congestion Urologic: Frequent urination urination at night and hematuria Abdominal: negative for nausea, vomiting, diarrhea, bright red blood per rectum, melena, or hematemesis Neurologic: negative for visual changes, and/or hearing changes  All other systems reviewed and are  otherwise negative except as noted above.  Labs:  Recent Labs  01/04/16 1749 01/05/16 0027 01/05/16 0440  TROPONINI <0.03 <0.03 <0.03   Lab Results  Component Value Date   WBC 6.5 01/05/2016   HGB 9.0* 01/05/2016   HCT 26.9* 01/05/2016   MCV 91.2 01/05/2016   PLT 276 01/05/2016    Recent Labs Lab 01/04/16 1749  01/05/16 0440  NA 139  < > 139  K 6.3*  < > 5.4*  CL 109  < > 110  CO2 23  < > 23  BUN 43*  < > 39*  CREATININE 2.45*  < > 2.35*  CALCIUM 8.5*  < > 8.2*  PROT 7.5  --   --   BILITOT 0.4  --   --   ALKPHOS 113  --   --   ALT 9*  --   --   AST 19  --   --   GLUCOSE 88  < > 84  < > = values in this interval not displayed. No results found for: CHOL, HDL, LDLCALC, TRIG No results found for: DDIMER  Radiology/Studies:  Ct Abdomen Pelvis Wo Contrast  01/04/2016  CLINICAL DATA:  Patient c/o nausea x 1 day. States she was feeling more nauseated this morning when she went to [facility] today. EMS was called due to patient having a "near syncopal" event. Heart rate dropped to 32 beats per minute. EXAM: CT ABDOMEN AND PELVIS WITHOUT CONTRAST TECHNIQUE: Multidetector CT imaging of the abdomen and pelvis was performed following  the standard protocol without IV contrast. COMPARISON:  None. FINDINGS: Lower chest: No pulmonary nodules, pleural effusions, or infiltrates. There is atherosclerotic calcification of the coronary arteries. Heart size is normal. Upper abdomen: No focal abnormality identified within the liver, spleen, pancreas, or adrenal glands. Gallbladder is surgically absent. The kidneys are normal in appearance. No hydronephrosis. No intrarenal or ureteral stones. Gastrointestinal tract: The stomach and small bowel loops are normal in appearance. The appendix is well seen and has a normal appearance. Colonic diverticulosis. Pelvis: Urinary bladder is significantly distended. Status post hysterectomy. No adnexal mass. No free pelvic fluid. Retroperitoneum: There is mild atherosclerotic calcification of the abdominal aorta. No aneurysm. Abdominal wall: Unremarkable. Osseous structures: There is moderate degenerative change of the lower thoracic and lumbar spine. Superior endplate fracture of L1 is likely chronic. No suspicious lytic or blastic lesions are identified. IMPRESSION: 1. No acute abnormality of the abdomen and pelvis. 2. Normal appendix. 3. Colonic diverticulosis. 4. Status post cholecystectomy and hysterectomy. 5. Coronary artery disease. 6. Atherosclerosis of the abdominal aorta. 7. Distention of the urinary bladder. 8. Degenerative disc disease. 9. Chronic superior endplate fracture of L1. Electronically Signed   By: Nolon Nations M.D.   On: 01/04/2016 21:18   Dg Chest 2 View  01/04/2016  CLINICAL DATA:  Patient c/o nausea x 1 day. States she was feeling more nauseous this morning when she went to [facility] today. EMS was called due to paitent having a "near syncopal" event. Bradycardia. EXAM: CHEST  2 VIEW COMPARISON:  None. FINDINGS: Low lung volumes are present, causing crowding of the pulmonary vasculature. Mild enlargement of the cardiopericardial silhouette with interstitial accentuation. Borderline airway  thickening. Atherosclerotic aortic arch. Left proximal humeral prosthesis. Body habitus reduces diagnostic sensitivity and specificity. Suspected mid thoracic wedge compression, most likely chronic. IMPRESSION: 1. Mild enlargement of the cardiopericardial silhouette. There is upper zone pulmonary vascular prominence which could reflect pulmonary venous hypertension. Faint interstitial accentuation, query mild interstitial  edema. 2. Mid thoracic wedge compression, most likely chronic. Electronically Signed   By: Van Clines M.D.   On: 01/04/2016 16:26   US Soft Tissue Head/neck  12/28/2015  CLINICAL DATA:  62-year-old female with a history of enlarged thyroid EXAM: THYROID ULTRASOUND TECHNIQUE: Ultrasound examination of the thyroid gland and adjacent soft tissues was performed. COMPARISON:  None. FINDINGS: Right thyroid lobe Measurements: 4.4 cm x 1.6 cm x 2.3 cm. Heterogeneous appearance of right thyroid. Nodule within the mid right thyroid measures 1.4 cm x 9 mm x 1.2 cm. No internal calcifications or colloid. Well-defined margin with hypoechoic border. Smaller hypoechoic nodule at the inferior right thyroid measures 6 mm. Left thyroid lobe Measurements: 3.7 cm x 1.8 cm x 2.3 cm. Heterogeneous appearance of the left thyroid without significant internal flow. Isthmus Thickness: 3 mm.  No nodules visualized. Lymphadenopathy None visualized. IMPRESSION: Heterogeneous thyroid which is not significantly enlarged. Dominant right-sided nodule does not meet criteria for biopsy. Findings do not meet current SRU consensus criteria for biopsy. Follow-up by clinical exam is recommended. In a patient of this age with no risk factors for thyroid carcinoma, lengthening the surveillance interval beyond 12 months is a reasonable strategy, as thyroid cancers <2cm are known to be indolent with nearly 100% 10-year survival. If the patient has known risk factors for thyroid carcinoma, a follow-up ultrasound in 12 months is  recommended. - Managing incidental thyroid nodules detected on imaging: white paper of the ACR Incidental Thyroid Findings Committee. J Am Coll Radiol. 2015 Feb;12(2):143-50. - Management of Thyroid Nodules Detected at [place]: Society of Radiologists in [place]. Radiology 2005; [id number]. Signed, Dulcy Fanny. Earleen Newport, DO [facility] Electronically Signed   By: Corrie Mckusick D.O.   On: 12/28/2015 13:42    EKG: Sinus tachycardia with nonspecific ST changes  Weights: Filed Weights   01/04/16 1543 01/05/16 0036 01/05/16 0417  Weight: 282 lb 9.6 oz (128.187 kg) 284 lb (128.822 kg) 284 lb (128.822 kg)     Physical Exam: Blood pressure 124/44, pulse 79, temperature 98.3 F (36.8 C), temperature source Oral, resp. rate 20, height 5\' 2"  (1.575 m), weight 284 lb (128.822 kg), SpO2 97 %. Body mass index is 51.93 kg/(m^2). General: Well developed, well nourished, in no acute distress. Head eyes ears nose throat: Normocephalic, atraumatic, sclera non-icteric, no xanthomas, nares are without discharge. No apparent thyromegaly and/or mass  Lungs: Normal respiratory effort. Few diffuse wheezes, no rales, no rhonchi.  Heart: RRR with normal S1 S2. no murmur gallop, no rub, PMI is normal size and placement, carotid upstroke normal without bruit, jugular venous pressure is normal Abdomen: Soft, non-tender, distended with normoactive bowel sounds. No hepatomegaly. No rebound/guarding. No obvious abdominal masses. Abdominal aorta is normal size without bruit Extremities: 1+ edema. no cyanosis, no clubbing, no ulcers  Peripheral : 2+ bilateral upper extremity pulses, 2+ bilateral femoral pulses, 2+ bilateral dorsal pedal pulse Neuro: Alert and oriented. No facial asymmetry. No focal deficit. Moves all extremities spontaneously. Musculoskeletal: Normal muscle tone without kyphosis Psych:  Responds to questions appropriately with a  normal affect.    Assessment: 70 year old female with essential hypertension coronary artery disease mixed hyperlipidemia chronic kidney disease stage III with an episode of syncope and no current evidence of significant rhythm disturbances or myocardial infarction or heart failure. The deep ears that the patient had these symptoms most consistent with the situation and mildly traumatic issues with medications giving during tooth extraction now resolved  Plan: 1. Continue hypertension control with current medical regimen without changes 2. Continue telemetry while patient begins to ambulate and follow for rhythm disturbances causing syncope 3. Consider echocardiogram for LV systolic dysfunction valvular heart disease 4. Continue high intensity cholesterol therapy for further risk reduction cardiovascular event in the future 5. No further cardiac diagnostics necessary at this time unless further changes are seen with ambulation  Signed, Corey Skains M.D. [facility] 01/05/2016, 8:32 AM

## 2016-01-05 NOTE — Evaluation (Signed)
Physical Therapy Evaluation Patient Details Name: Sarah Hamilton MRN: [id number] DOB: 1945/12/04 Today's Date: 01/05/2016   History of Present Illness  presented to ER and admitted under observation for syncope (likely vagal response after recent tooth extraction per documentation)  Clinical Impression  Upon evaluation, patient alert and oriented; follows all commands and demonstrates fair/good insight into safety needs.  R UE generally restricted in shoulder elevation (due to previous injury); otherwise, bilat UE/LEs grossly WFL and symmetrical for basic transfers and mobility.  Able to complete supine/sit with sup/mod indep; sit/stand, basic transfers and very short-distance gait (bed/chair) with RW, cga/min assist.  Broad BOS with excessive lateral sway; no buckling or LOB.  Vitals stable and WFL; no reports of orthostasis, syncopal feelings.  Additional activity deferred secondary to patient fatigue. Would benefit from skilled PT to address above deficits and promote optimal return to PLOF; Recommend transition to [place] and continued participation with [facility] program upon discharge from acute hospitalization.     Follow Up Recommendations Home health PT    Equipment Recommendations       Recommendations for Other Services       Precautions / Restrictions Precautions Precautions: Fall Restrictions Weight Bearing Restrictions: No      Mobility  Bed Mobility Overal bed mobility: Needs Assistance Bed Mobility: Supine to Sit     Supine to sit: Supervision;Modified independent (Device/Increase time)     General bed mobility comments: heavy use of bedrails, elevated HOB  Transfers Overall transfer level: Needs assistance Equipment used: Rolling walker (2 wheeled) Transfers: Sit to/from Stand Sit to Stand: Min assist;Min guard         General transfer comment: requires use of bilat UEs to complete  Ambulation/Gait Ambulation/Gait assistance: Min guard;Min  assist Ambulation Distance (Feet): 5 Feet Assistive device: Rolling walker (2 wheeled)       General Gait Details: broad BOS, excessive lateral sway with forward trunk flexion  Stairs            Wheelchair Mobility    Modified Rankin (Stroke Patients Only)       Balance Overall balance assessment: Needs assistance Sitting-balance support: No upper extremity supported;Feet supported Sitting balance-Leahy Scale: Good     Standing balance support: Bilateral upper extremity supported Standing balance-Leahy Scale: Fair                               Pertinent Vitals/Pain Pain Assessment: Faces Faces Pain Scale: Hurts little more Pain Location: R shoulder Pain Descriptors / Indicators: Aching;Sore Pain Intervention(s): Limited activity within patient's tolerance;Monitored during session;Repositioned    Home Living Family/patient expects to be discharged to:: Private residence Living Arrangements: Spouse/significant other;Children Available Help at Discharge: Family Type of Home: House Home Access: Ramped entrance     Home Layout: One level Home Equipment: Environmental consultant - 4 wheels (lifeline)      Prior Function           Comments: Mod indep for limited household ambulation with 4WRW; assist from [facility] (2 days/week, 2 hours/day) for ADLs.  [facility] for medical appointments and therapy services.     Hand Dominance        Extremity/Trunk Assessment   Upper Extremity Assessment:  (R shoulder elevation to shoulder height only (previous injury), limited by pain.  L UE grossly WFL)           Lower Extremity Assessment: Overall WFL for tasks assessed (grossly  at least 3+/5 throughout bilat LEs)         Communication   Communication: No difficulties  Cognition Arousal/Alertness: Awake/alert Behavior During Therapy: WFL for tasks assessed/performed Overall Cognitive Status: Within Functional Limits for tasks assessed                       General Comments      Exercises        Assessment/Plan    PT Assessment Patient needs continued PT services  PT Diagnosis Difficulty walking;Generalized weakness   PT Problem List Decreased strength;Decreased activity tolerance;Decreased balance;Decreased range of motion;Decreased mobility;Obesity  PT Treatment Interventions DME instruction;Gait training;Functional mobility training;Therapeutic activities;Therapeutic exercise;Balance training;Patient/family education   PT Goals (Current goals can be found in the Care Plan section) Acute Rehab PT Goals Patient Stated Goal: to return home with husband and son PT Goal Formulation: With patient Time For Goal Achievement: 01/19/16 Potential to Achieve Goals: Good    Frequency Min 2X/week   Barriers to discharge Decreased caregiver support      Co-evaluation               End of Session Equipment Utilized During Treatment: Gait belt Activity Tolerance: Patient limited by fatigue Patient left: in chair;with call bell/phone within reach;with chair alarm set      Functional Assessment Tool Used: clinical judgement Functional Limitation: Mobility: Walking and moving around Mobility: Walking and Moving Around Current Status [phone]): At least 40 percent but less than 60 percent impaired, limited or restricted Mobility: Walking and Moving Around Goal Status [phone]): At least 1 percent but less than 20 percent impaired, limited or restricted    Time: [phone] PT Time Calculation (min) (ACUTE ONLY): 23 min   Charges:   PT Evaluation $PT Eval Low Complexity: 1 Procedure     PT G Codes:   PT G-Codes **NOT FOR INPATIENT CLASS** Functional Assessment Tool Used: clinical judgement Functional Limitation: Mobility: Walking and moving around Mobility: Walking and Moving Around Current Status [phone]): At least 40 percent but less than 60 percent impaired, limited or restricted Mobility: Walking and  Moving Around Goal Status [phone]): At least 1 percent but less than 20 percent impaired, limited or restricted    Tomica Arseneault H. Owens Shark, PT, DPT, NCS 01/05/2016, 5:05 PM [phone]

## 2016-01-05 NOTE — Progress Notes (Signed)
*  PRELIMINARY RESULTS* Echocardiogram 2D Echocardiogram has been performed.  Sarah Hamilton 01/05/2016, 7:54 AM

## 2016-01-05 NOTE — Consult Note (Signed)
[facility]  CONSULT NOTE    Date: 01/05/2016                  Patient Name:  Sarah Hamilton  MRN: [id number]  DOB: Feb 27, 1946  Age / Sex: 70 y.o., female         PCP: No PCP Per Patient                 Service Requesting Consult: Dr. Jannifer Franklin                 Reason for Consult: Hyperkalemia, acute renal failure on chronic kidney disease stage III            History of Present Illness: Ms. Sarah Hamilton is a 70 y.o. black female with hypertension, diverticulitis, coronary artery disease, gout, hyperlipidemia, seizure disorder, vitamin D defcieincy, who was admitted to [facility] on 01/04/2016 for Acute hyperkalemia [E87.5] Nausea [R11.0] Renal failure [N19] Vasovagal near syncope [R55] Abdominal pain, unspecified abdominal location [R10.9]  Patient was admitted with bradycardia and after passing out. She went for a dental extraction yesterday. She was found to have potassium 6.3. She had bradycardia.   She states she has been having a severe dry cough for last few months. She is unclear when she was started on lisinopril. According to outpatient nephrology records from [facility], she was told to stop lisinopril due to hyperkalemia and cough in 09/13/15   Medications: Outpatient medications: Prescriptions prior to admission  Medication Sig Dispense Refill Last Dose  . amLODipine (NORVASC) 5 MG tablet Take 5 mg by mouth daily.   01/04/2016 at Unknown time  . aspirin EC 81 MG tablet Take 81 mg by mouth daily.   01/04/2016 at 0815  . atorvastatin (LIPITOR) 20 MG tablet Take 20 mg by mouth at bedtime.   01/03/2016 at Unknown time  . camphor-menthol (SARNA) lotion Apply 1 application topically 2 (two) times daily as needed for itching.   prn  . capsaicin (ZOSTRIX) 0.025 % cream Apply 1 application topically at bedtime as needed.   prn  . carvedilol (COREG) 25 MG tablet Take 25 mg by mouth 2 (two) times daily.   01/04/2016 at Unknown time  . Cranberry-Vitamin C-Probiotic (AZO  CRANBERRY) 250-30 MG TABS Take 2 capsules by mouth daily.   01/04/2016 at Unknown time  . diclofenac sodium (VOLTAREN) 1 % GEL Apply 1 application topically 4 (four) times daily as needed.   prn  . DULoxetine (CYMBALTA) 30 MG capsule Take 1 capsule by mouth daily.   01/04/2016 at Unknown time  . febuxostat (ULORIC) 40 MG tablet Take 40 mg by mouth daily.   01/04/2016 at Unknown time  . gabapentin (NEURONTIN) 100 MG capsule Take 1 capsule by mouth 2 (two) times daily.   01/04/2016 at Unknown time  . isosorbide mononitrate (IMDUR) 30 MG 24 hr tablet Take 30 mg by mouth daily.   01/04/2016 at Unknown time  . levETIRAcetam (KEPPRA) 500 MG tablet Take 500 mg by mouth 2 (two) times daily.   01/04/2016 at Unknown time  . lisinopril (PRINIVIL,ZESTRIL) 2.5 MG tablet Take 2.5 mg by mouth daily.   01/04/2016 at Unknown time  . losartan (COZAAR) 25 MG tablet Take 25 mg by mouth daily.   01/04/2016 at Unknown time  . nitroGLYCERIN (NITROSTAT) 0.4 MG SL tablet Place 0.4 mg under the tongue every 5 (five) minutes as needed.   prn  . nystatin cream (MYCOSTATIN) Apply 1 application topically 2 (two) times daily.  01/04/2016 at Unknown time  . oxyCODONE-acetaminophen (PERCOCET/ROXICET) 5-325 MG tablet Take 2 tablets by mouth 2 (two) times daily as needed.   prn  . pantoprazole (PROTONIX) 40 MG tablet Take 40 mg by mouth daily.   01/04/2016 at Unknown time  . polyethylene glycol (MIRALAX / GLYCOLAX) packet Take 17 g by mouth daily as needed.   prn  . promethazine (PHENERGAN) 12.5 MG tablet Take 12.5 mg by mouth every 6 (six) hours as needed.   prn  . senna-docusate (SENOKOT-S) 8.6-50 MG tablet Take 2 tablets by mouth daily as needed.   prn  . sodium bicarbonate 325 MG tablet Take 650 mg by mouth 2 (two) times daily.   01/04/2016 at Unknown time  . Vitamin D, Ergocalciferol, (DRISDOL) 50000 units CAPS capsule Take 1 capsule by mouth once a week. On mondays   Past Week at Unknown time    Current medications: Current  Facility-Administered Medications  Medication Dose Route Frequency Provider Last Rate Last Dose  . acetaminophen (TYLENOL) tablet 650 mg  650 mg Oral Q6H PRN Oralia Manis, MD       Or  . acetaminophen (TYLENOL) suppository 650 mg  650 mg Rectal Q6H PRN Oralia Manis, MD      . amLODipine (NORVASC) tablet 5 mg  5 mg Oral Daily Oralia Manis, MD   5 mg at 01/05/16 0831  . aspirin EC tablet 81 mg  81 mg Oral Daily Oralia Manis, MD   81 mg at 01/05/16 0834  . atorvastatin (LIPITOR) tablet 20 mg  20 mg Oral QHS Oralia Manis, MD      . carvedilol (COREG) tablet 25 mg  25 mg Oral BID Oralia Manis, MD   25 mg at 01/05/16 0831  . DULoxetine (CYMBALTA) DR capsule 30 mg  30 mg Oral Daily Oralia Manis, MD   30 mg at 01/05/16 6606  . febuxostat (ULORIC) tablet 40 mg  40 mg Oral Daily Oralia Manis, MD   40 mg at 01/05/16 0834  . heparin injection 5,000 Units  5,000 Units Subcutaneous 3 times per day Oralia Manis, MD   5,000 Units at 01/05/16 0439  . isosorbide mononitrate (IMDUR) 24 hr tablet 30 mg  30 mg Oral Daily Oralia Manis, MD   30 mg at 01/05/16 0835  . levETIRAcetam (KEPPRA) tablet 500 mg  500 mg Oral BID Oralia Manis, MD   500 mg at 01/05/16 0831  . lisinopril (PRINIVIL,ZESTRIL) tablet 2.5 mg  2.5 mg Oral Daily Oralia Manis, MD   2.5 mg at 01/05/16 0834  . ondansetron (ZOFRAN) injection 4 mg  4 mg Intravenous Q6H PRN Arnaldo Natal, MD   4 mg at 01/05/16 0439  . oxyCODONE (Oxy IR/ROXICODONE) immediate release tablet 5 mg  5 mg Oral Q6H PRN Oralia Manis, MD   5 mg at 01/05/16 3016  . pantoprazole (PROTONIX) EC tablet 40 mg  40 mg Oral Daily Oralia Manis, MD   40 mg at 01/05/16 0834  . polyethylene glycol (MIRALAX / GLYCOLAX) packet 17 g  17 g Oral Daily PRN Oralia Manis, MD      . senna-docusate (Senokot-S) tablet 2 tablet  2 tablet Oral Daily PRN Oralia Manis, MD      . sodium bicarbonate tablet 650 mg  650 mg Oral BID Oralia Manis, MD   650 mg at 01/05/16 0834  . sodium chloride flush (NS) 0.9 %  injection 3 mL  3 mL Intravenous Q12H Oralia Manis, MD   3 mL at 01/05/16  5009      Allergies: Allergies  Allergen Reactions  . Pregabalin     Other reaction(s): Unknown  . Erythromycin       Past Medical History: Past Medical History  Diagnosis Date  . Hyperparathyroidism ([place])   . Dysphagia   . CKD (chronic kidney disease), stage IV ([place])   . Dementia   . Spinal stenosis   . Diverticulitis   . Diverticulosis   . Hyperlipidemia   . Anemia   . Seizures ([place])   . Coronary artery disease   . Hypertension   . Gout      Past Surgical History: Past Surgical History  Procedure Laterality Date  . Cholecystectomy    . Abdominal hysterectomy    . Shoulder surgery    . Joint replacement       Family History: Family History  Problem Relation Age of Onset  . CAD Mother   . Hypertension Mother   . Stroke Mother   . Kidney failure Mother   . Cancer Father   . Kidney failure Brother   . Kidney failure Son      Social History: Social History   Social History  . Marital Status: Married    Spouse Name: N/A  . Number of Children: N/A  . Years of Education: N/A   Occupational History  . Not on file.   Social History Main Topics  . Smoking status: Never Smoker   . Smokeless tobacco: Not on file  . Alcohol Use: No  . Drug Use: No  . Sexual Activity: Not on file   Other Topics Concern  . Not on file   Social History Narrative     Review of Systems: Review of Systems  Constitutional: Negative.  Negative for fever, chills, weight loss, malaise/fatigue and diaphoresis.  HENT: Positive for ear pain. Negative for congestion, ear discharge, hearing loss, nosebleeds, sore throat and tinnitus.   Eyes: Negative.  Negative for blurred vision, double vision, photophobia, pain, discharge and redness.  Respiratory: Positive for cough and shortness of breath. Negative for hemoptysis, sputum production, wheezing and stridor.   Cardiovascular: Positive for chest pain.  Negative for palpitations, orthopnea, claudication, leg swelling and PND.  Gastrointestinal: Negative.  Negative for heartburn, nausea, vomiting, abdominal pain, diarrhea, constipation, blood in stool and melena.  Genitourinary: Negative.  Negative for dysuria, urgency, frequency, hematuria and flank pain.  Musculoskeletal: Negative.  Negative for myalgias, back pain, joint pain, falls and neck pain.  Skin: Negative.  Negative for itching and rash.  Neurological: Positive for seizures, loss of consciousness and headaches. Negative for weakness.  Endo/Heme/Allergies: Negative for environmental allergies and polydipsia. Does not bruise/bleed easily.  Psychiatric/Behavioral: Negative.  Negative for depression, suicidal ideas, hallucinations, memory loss and substance abuse. The patient is not nervous/anxious and does not have insomnia.     Vital Signs: Blood pressure 124/44, pulse 79, temperature 98.3 F (36.8 C), temperature source Oral, resp. rate 20, height '5\' 2"'$  (1.575 m), weight 128.822 kg (284 lb), SpO2 97 %.  Weight trends: Filed Weights   01/04/16 1543 01/05/16 0036 01/05/16 0417  Weight: 128.187 kg (282 lb 9.6 oz) 128.822 kg (284 lb) 128.822 kg (284 lb)    Physical Exam: General: NAD, laying in bed  Head: Normocephalic, atraumatic. Moist oral mucosal membranes  Eyes: Anicteric, PERRL  Neck: Supple, trachea midline  Lungs:  Clear to auscultation  Heart: Regular rate and rhythm  Abdomen:  Soft, nontender,   Extremities: no peripheral edema.  Neurologic: Nonfocal,  moving all four extremities  Skin: No lesions        Lab results: Basic Metabolic Panel:  Recent Labs Lab 01/04/16 1749 01/04/16 2104 01/05/16 0440  NA 139 136 139  K 6.3* 5.4* 5.4*  CL 109 107 110  CO2 '23 22 23  '$ GLUCOSE 88 100* 84  BUN 43* 42* 39*  CREATININE 2.45* 2.32* 2.35*  CALCIUM 8.5* 8.1* 8.2*    Liver Function Tests:  Recent Labs Lab 01/04/16 1749  AST 19  ALT 9*  ALKPHOS 113  BILITOT  0.4  PROT 7.5  ALBUMIN 3.8    Recent Labs Lab 01/04/16 1749  LIPASE 28   No results for input(s): AMMONIA in the last 168 hours.  CBC:  Recent Labs Lab 01/04/16 1749 01/05/16 0440  WBC 6.6 6.5  NEUTROABS 4.2  --   HGB 8.8* 9.0*  HCT 27.0* 26.9*  MCV 91.4 91.2  PLT 288 276    Cardiac Enzymes:  Recent Labs Lab 01/04/16 1749 01/05/16 0027 01/05/16 0440  TROPONINI <0.03 <0.03 <0.03    BNP: Invalid input(s): POCBNP  CBG: No results for input(s): GLUCAP in the last 168 hours.  Microbiology: No results found for this or any previous visit.  Coagulation Studies: No results for input(s): LABPROT, INR in the last 72 hours.  Urinalysis:  Recent Labs  01/04/16 2104  COLORURINE STRAW*  LABSPEC 1.010  PHURINE 6.0  GLUCOSEU NEGATIVE  HGBUR NEGATIVE  BILIRUBINUR NEGATIVE  KETONESUR NEGATIVE  PROTEINUR 100*  NITRITE NEGATIVE  LEUKOCYTESUR NEGATIVE      Imaging: Ct Abdomen Pelvis Wo Contrast  01/04/2016  CLINICAL DATA:  Patient c/o nausea x 1 day. States she was feeling more nauseated this morning when she went to [facility] today. EMS was called due to patient having a "near syncopal" event. Heart rate dropped to 32 beats per minute. EXAM: CT ABDOMEN AND PELVIS WITHOUT CONTRAST TECHNIQUE: Multidetector CT imaging of the abdomen and pelvis was performed following the standard protocol without IV contrast. COMPARISON:  None. FINDINGS: Lower chest: No pulmonary nodules, pleural effusions, or infiltrates. There is atherosclerotic calcification of the coronary arteries. Heart size is normal. Upper abdomen: No focal abnormality identified within the liver, spleen, pancreas, or adrenal glands. Gallbladder is surgically absent. The kidneys are normal in appearance. No hydronephrosis. No intrarenal or ureteral stones. Gastrointestinal tract: The stomach and small bowel loops are normal in appearance. The appendix is well seen and has a normal appearance. Colonic diverticulosis.  Pelvis: Urinary bladder is significantly distended. Status post hysterectomy. No adnexal mass. No free pelvic fluid. Retroperitoneum: There is mild atherosclerotic calcification of the abdominal aorta. No aneurysm. Abdominal wall: Unremarkable. Osseous structures: There is moderate degenerative change of the lower thoracic and lumbar spine. Superior endplate fracture of L1 is likely chronic. No suspicious lytic or blastic lesions are identified. IMPRESSION: 1. No acute abnormality of the abdomen and pelvis. 2. Normal appendix. 3. Colonic diverticulosis. 4. Status post cholecystectomy and hysterectomy. 5. Coronary artery disease. 6. Atherosclerosis of the abdominal aorta. 7. Distention of the urinary bladder. 8. Degenerative disc disease. 9. Chronic superior endplate fracture of L1. Electronically Signed   By: Nolon Nations M.D.   On: 01/04/2016 21:18   Dg Chest 2 View  01/04/2016  CLINICAL DATA:  Patient c/o nausea x 1 day. States she was feeling more nauseous this morning when she went to [facility] today. EMS was called due to paitent having a "near syncopal" event. Bradycardia. EXAM: CHEST  2 VIEW COMPARISON:  None.  FINDINGS: Low lung volumes are present, causing crowding of the pulmonary vasculature. Mild enlargement of the cardiopericardial silhouette with interstitial accentuation. Borderline airway thickening. Atherosclerotic aortic arch. Left proximal humeral prosthesis. Body habitus reduces diagnostic sensitivity and specificity. Suspected mid thoracic wedge compression, most likely chronic. IMPRESSION: 1. Mild enlargement of the cardiopericardial silhouette. There is upper zone pulmonary vascular prominence which could reflect pulmonary venous hypertension. Faint interstitial accentuation, query mild interstitial edema. 2. Mid thoracic wedge compression, most likely chronic. Electronically Signed   By: Van Clines M.D.   On: 01/04/2016 16:26      Assessment & Plan: Ms. Sneha Willig is a 70  y.o. black female with hypertension, diverticulitis, coronary artery disease, gout, hyperlipidemia, seizure disorder, vitamin D defcieincy, who was admitted to [facility] on 01/04/2016   1. Acute Renal Failure with hyperkalemia with chronic kidney disease stage III with proteinuria: Baseline creatinine of 1.48, eGFR of 42 from 12/21/14. Follows with [facility]. Chronic kidney disease is thoughtto be due to hypertension.  Acute renal failure from dental extraction, lisinopril, and seizure. - Discontinue lisinopril due to hyperkalemia and chronic dry cough. Do not restart until she is seen by her primary nephrologist as an outpatient.  - Renal ultrasound - No acute indication for dialysis. Start sodium bicarbonate for hyperkalemia.   2. Hypertension: blood pressure at goal.  - discontinue lisinopril - amlodipine, carvedilol, imdur,   LOS:  Lindalee Huizinga 3/10/20179:58 AM

## 2016-01-06 DIAGNOSIS — R55 Syncope and collapse: Secondary | ICD-10-CM | POA: Diagnosis not present

## 2016-01-06 LAB — BASIC METABOLIC PANEL
Anion gap: 10 (ref 5–15)
BUN: 38 mg/dL — AB (ref 6–20)
CALCIUM: 7.9 mg/dL — AB (ref 8.9–10.3)
CHLORIDE: 106 mmol/L (ref 101–111)
CO2: 22 mmol/L (ref 22–32)
CREATININE: 2.54 mg/dL — AB (ref 0.44–1.00)
GFR calc Af Amer: 21 mL/min — ABNORMAL LOW (ref >60)
GFR calc non Af Amer: 18 mL/min — ABNORMAL LOW (ref >60)
GLUCOSE: 96 mg/dL (ref 65–99)
Potassium: 5 mmol/L (ref 3.5–5.1)
Sodium: 138 mmol/L (ref 135–145)

## 2016-01-06 MED ORDER — LIDOCAINE VISCOUS 2 % MT SOLN: 15.0000 mL | OROMUCOSAL | Status: DC | PRN

## 2016-01-06 MED ORDER — ONDANSETRON HCL 4 MG/2ML IJ SOLN: 4.0000 mg | Freq: Four times a day (QID) | INTRAMUSCULAR | Status: DC | PRN

## 2016-01-06 MED ORDER — FUROSEMIDE 20 MG PO TABS: 20.0000 mg | ORAL_TABLET | Freq: Every day | ORAL | Status: DC

## 2016-01-06 MED ORDER — FUROSEMIDE 20 MG PO TABS
20.0000 mg | ORAL_TABLET | Freq: Every day | ORAL | Status: DC
Start: 2016-01-06 — End: 2016-01-06
  Administered 2016-01-06: 20 mg via ORAL
  Filled 2016-01-06: qty 1

## 2016-01-06 MED ORDER — LIDOCAINE VISCOUS 2 % MT SOLN
15.0000 mL | OROMUCOSAL | Status: DC | PRN
  Filled 2016-01-06: qty 15

## 2016-01-06 MED ORDER — PANTOPRAZOLE SODIUM 40 MG PO TBEC: 40.0000 mg | DELAYED_RELEASE_TABLET | Freq: Every day | ORAL | Status: AC

## 2016-01-06 NOTE — Discharge Summary (Signed)
Sarah Hamilton, 70 y.o., DOB August 10, 1946, MRN [id number]. Admission date: 01/04/2016 Discharge Date 01/06/2016 Primary MD No PCP Per Patient Admitting Physician Lance Coon, MD  Admission Diagnosis  Acute hyperkalemia [E87.5] Nausea [R11.0] Renal failure [N19] Vasovagal near syncope [R55] Abdominal pain, unspecified abdominal location [R10.9]  Discharge Diagnosis   Principal Problem:   Syncope suspect vasovagal   Hyperkalemia   HTN (hypertension)   GERD (gastroesophageal reflux disease)   CAD (coronary artery disease)   HLD (hyperlipidemia)  Chronic neck pain Hyperparathyroidism Dysphagia Chronic kidney disease stage IV Dementia Spinal stenosis History of diverticulitis Hyperlipidemia Seizures Coronary artery disease Gout        Hospital Course Sarah Hamilton is a 70 y.o. female who presents after a syncopal episode. Patient was at a [facility] program today after a visit with the dentist for tooth extractions. She became acutely flushed, nauseated, felt very hot, was trying her sweater off and syncopized. Heart rate measured during the event showed her to be bradycardic in the 30s. She woke up after a brief period, did not have any postictal state, did not have any reported seizure activity during the event. She was brought to the hospital for evaluation. Patient was noted to have a potassium level of 6.3 received 1 dose of Kayexalate and admitted for syncope. Syncope evaluation showed no evidence of arrhythmia. Echocardiogram of the heart showed no cardiac dysfunction. It was felt that her syncope was likely vasovagal in nature. No further syncopal episodes. She was seen by cardiology as well. Patient also was noted to have hyperkalemia and was seen by nephrology and her potassium is now stable they recommended starting patient on Lasix orally. At this time she is feeling okay and stable for discharge           Consults  cardiology  Significant Tests:  See full reports for  all details      Ct Abdomen Pelvis Wo Contrast  01/04/2016  CLINICAL DATA:  Patient c/o nausea x 1 day. States she was feeling more nauseated this morning when she went to [facility] today. EMS was called due to patient having a "near syncopal" event. Heart rate dropped to 32 beats per minute. EXAM: CT ABDOMEN AND PELVIS WITHOUT CONTRAST TECHNIQUE: Multidetector CT imaging of the abdomen and pelvis was performed following the standard protocol without IV contrast. COMPARISON:  None. FINDINGS: Lower chest: No pulmonary nodules, pleural effusions, or infiltrates. There is atherosclerotic calcification of the coronary arteries. Heart size is normal. Upper abdomen: No focal abnormality identified within the liver, spleen, pancreas, or adrenal glands. Gallbladder is surgically absent. The kidneys are normal in appearance. No hydronephrosis. No intrarenal or ureteral stones. Gastrointestinal tract: The stomach and small bowel loops are normal in appearance. The appendix is well seen and has a normal appearance. Colonic diverticulosis. Pelvis: Urinary bladder is significantly distended. Status post hysterectomy. No adnexal mass. No free pelvic fluid. Retroperitoneum: There is mild atherosclerotic calcification of the abdominal aorta. No aneurysm. Abdominal wall: Unremarkable. Osseous structures: There is moderate degenerative change of the lower thoracic and lumbar spine. Superior endplate fracture of L1 is likely chronic. No suspicious lytic or blastic lesions are identified. IMPRESSION: 1. No acute abnormality of the abdomen and pelvis. 2. Normal appendix. 3. Colonic diverticulosis. 4. Status post cholecystectomy and hysterectomy. 5. Coronary artery disease. 6. Atherosclerosis of the abdominal aorta. 7. Distention of the urinary bladder. 8. Degenerative disc disease. 9. Chronic superior endplate fracture of L1. Electronically Signed   By: Nolon Nations M.D.  On: 01/04/2016 21:18   Dg Chest 2 View  01/04/2016  CLINICAL  DATA:  Patient c/o nausea x 1 day. States she was feeling more nauseous this morning when she went to [facility] today. EMS was called due to paitent having a "near syncopal" event. Bradycardia. EXAM: CHEST  2 VIEW COMPARISON:  None. FINDINGS: Low lung volumes are present, causing crowding of the pulmonary vasculature. Mild enlargement of the cardiopericardial silhouette with interstitial accentuation. Borderline airway thickening. Atherosclerotic aortic arch. Left proximal humeral prosthesis. Body habitus reduces diagnostic sensitivity and specificity. Suspected mid thoracic wedge compression, most likely chronic. IMPRESSION: 1. Mild enlargement of the cardiopericardial silhouette. There is upper zone pulmonary vascular prominence which could reflect pulmonary venous hypertension. Faint interstitial accentuation, query mild interstitial edema. 2. Mid thoracic wedge compression, most likely chronic. Electronically Signed   By: Van Clines M.D.   On: 01/04/2016 16:26   US Soft Tissue Head/neck  12/28/2015  CLINICAL DATA:  70-year-old female with a history of enlarged thyroid EXAM: THYROID ULTRASOUND TECHNIQUE: Ultrasound examination of the thyroid gland and adjacent soft tissues was performed. COMPARISON:  None. FINDINGS: Right thyroid lobe Measurements: 4.4 cm x 1.6 cm x 2.3 cm. Heterogeneous appearance of right thyroid. Nodule within the mid right thyroid measures 1.4 cm x 9 mm x 1.2 cm. No internal calcifications or colloid. Well-defined margin with hypoechoic border. Smaller hypoechoic nodule at the inferior right thyroid measures 6 mm. Left thyroid lobe Measurements: 3.7 cm x 1.8 cm x 2.3 cm. Heterogeneous appearance of the left thyroid without significant internal flow. Isthmus Thickness: 3 mm.  No nodules visualized. Lymphadenopathy None visualized. IMPRESSION: Heterogeneous thyroid which is not significantly enlarged. Dominant right-sided nodule does not meet criteria for biopsy. Findings do not meet  current SRU consensus criteria for biopsy. Follow-up by clinical exam is recommended. In a patient of this age with no risk factors for thyroid carcinoma, lengthening the surveillance interval beyond 12 months is a reasonable strategy, as thyroid cancers <2cm are known to be indolent with nearly 100% 10-year survival. If the patient has known risk factors for thyroid carcinoma, a follow-up ultrasound in 12 months is recommended. - Managing incidental thyroid nodules detected on imaging: white paper of the ACR Incidental Thyroid Findings Committee. J Am Coll Radiol. 2015 Feb;12(2):143-50. - Management of Thyroid Nodules Detected at [place]: Society of Radiologists in [place]. Radiology 2005; [id number]. Signed, Dulcy Fanny. Earleen Newport, DO [facility] Electronically Signed   By: Corrie Mckusick D.O.   On: 12/28/2015 13:42       Today   Subjective:   Sarah Hamilton  feels well has chronic neck pain  Objective:   Blood pressure 122/97, pulse 88, temperature 98.6 F (37 C), temperature source Oral, resp. rate 20, height 5\' 2"  (1.575 m), weight 129.003 kg (284 lb 6.4 oz), SpO2 90 %.  .  Intake/Output Summary (Last 24 hours) at 01/06/16 1437 Last data filed at 01/06/16 1200  Gross per 24 hour  Intake    740 ml  Output    900 ml  Net   -160 ml    Exam VITAL SIGNS: Blood pressure 122/97, pulse 88, temperature 98.6 F (37 C), temperature source Oral, resp. rate 20, height 5\' 2"  (1.575 m), weight 129.003 kg (284 lb 6.4 oz), SpO2 90 %.  GENERAL:  70 y.o.-year-old patient lying in the bed with no acute distress.  EYES: Pupils equal, round, reactive to light and accommodation. No scleral icterus. Extraocular muscles  intact.  HEENT: Head atraumatic, normocephalic. Oropharynx and nasopharynx clear.  NECK:  Supple, no jugular venous distention. No thyroid enlargement, no tenderness.  LUNGS: Normal breath sounds bilaterally,  no wheezing, rales,rhonchi or crepitation. No use of accessory muscles of respiration.  CARDIOVASCULAR: S1, S2 normal. No murmurs, rubs, or gallops.  ABDOMEN: Soft, nontender, nondistended. Bowel sounds present. No organomegaly or mass.  EXTREMITIES: No pedal edema, cyanosis, or clubbing.  NEUROLOGIC: Cranial nerves II through XII are intact. Muscle strength 5/5 in all extremities. Sensation intact. Gait not checked.  PSYCHIATRIC: The patient is alert and oriented x 3.  SKIN: No obvious rash, lesion, or ulcer.   Data Review     CBC w Diff: Lab Results  Component Value Date   WBC 6.5 01/05/2016   HGB 9.0* 01/05/2016   HCT 26.9* 01/05/2016   PLT 276 01/05/2016   LYMPHOPCT 24 01/04/2016   MONOPCT 9 01/04/2016   EOSPCT 3 01/04/2016   BASOPCT 1 01/04/2016   CMP: Lab Results  Component Value Date   NA 138 01/06/2016   K 5.0 01/06/2016   CL 106 01/06/2016   CO2 22 01/06/2016   BUN 38* 01/06/2016   CREATININE 2.54* 01/06/2016   PROT 7.5 01/04/2016   ALBUMIN 3.8 01/04/2016   BILITOT 0.4 01/04/2016   ALKPHOS 113 01/04/2016   AST 19 01/04/2016   ALT 9* 01/04/2016  .  Micro Results No results found for this or any previous visit (from the past 240 hour(s)).      Code Status Orders        Start     Ordered   01/04/16 2353  Full code   Continuous     01/04/16 2352    Code Status History    Date Active Date Inactive Code Status Order ID Comments User Context   This patient has a current code status but no historical code status.    Advance Directive Documentation        Most Recent Value   Type of Advance Directive  Healthcare Power of Attorney   Pre-existing out of facility DNR order (yellow form or pink MOST form)     "MOST" Form in Place?            Follow-up Information    Follow up with [facility] In 7 days.      Discharge Medications     Medication List    STOP taking these medications        lisinopril 2.5 MG tablet  Commonly known as:   PRINIVIL,ZESTRIL     losartan 25 MG tablet  Commonly known as:  COZAAR      TAKE these medications        amLODipine 5 MG tablet  Commonly known as:  NORVASC  Take 5 mg by mouth daily.     aspirin EC 81 MG tablet  Take 81 mg by mouth daily.     atorvastatin 20 MG tablet  Commonly known as:  LIPITOR  Take 20 mg by mouth at bedtime.     AZO CRANBERRY 250-30 MG Tabs  Take 2 capsules by mouth daily.     camphor-menthol lotion  Commonly known as:  SARNA  Apply 1 application topically 2 (two) times daily as needed for itching.     capsaicin 0.025 % cream  Commonly known as:  ZOSTRIX  Apply 1 application topically at bedtime as needed.     carvedilol 25 MG tablet  Commonly known as:  COREG  Take 25 mg by mouth 2 (two) times daily.     DULoxetine 30 MG capsule  Commonly known as:  CYMBALTA  Take 1 capsule by mouth daily.     furosemide 20 MG tablet  Commonly known as:  LASIX  Take 1 tablet (20 mg total) by mouth daily.     gabapentin 100 MG capsule  Commonly known as:  NEURONTIN  Take 1 capsule by mouth 2 (two) times daily.     isosorbide mononitrate 30 MG 24 hr tablet  Commonly known as:  IMDUR  Take 30 mg by mouth daily.     levETIRAcetam 500 MG tablet  Commonly known as:  KEPPRA  Take 500 mg by mouth 2 (two) times daily.     lidocaine 2 % solution  Commonly known as:  XYLOCAINE  Use as directed 15 mLs in the mouth or throat every 4 (four) hours as needed for mouth pain.     NITROSTAT 0.4 MG SL tablet  Generic drug:  nitroGLYCERIN  Place 0.4 mg under the tongue every 5 (five) minutes as needed.     nystatin cream  Commonly known as:  MYCOSTATIN  Apply 1 application topically 2 (two) times daily.     ondansetron 4 MG/2ML Soln injection  Commonly known as:  ZOFRAN  Inject 2 mLs (4 mg total) into the vein every 6 (six) hours as needed for nausea or vomiting.     oxyCODONE-acetaminophen 5-325 MG tablet  Commonly known as:  PERCOCET/ROXICET  Take 2 tablets  by mouth 2 (two) times daily as needed.     pantoprazole 40 MG tablet  Commonly known as:  PROTONIX  Take 1 tablet (40 mg total) by mouth daily.     polyethylene glycol packet  Commonly known as:  MIRALAX / GLYCOLAX  Take 17 g by mouth daily as needed.     promethazine 12.5 MG tablet  Commonly known as:  PHENERGAN  Take 12.5 mg by mouth every 6 (six) hours as needed.     senna-docusate 8.6-50 MG tablet  Commonly known as:  Senokot-S  Take 2 tablets by mouth daily as needed.     sodium bicarbonate 325 MG tablet  Take 650 mg by mouth 2 (two) times daily.     ULORIC 40 MG tablet  Generic drug:  febuxostat  Take 40 mg by mouth daily.     Vitamin D (Ergocalciferol) 50000 units Caps capsule  Commonly known as:  DRISDOL  Take 1 capsule by mouth once a week. On mondays     VOLTAREN 1 % Gel  Generic drug:  diclofenac sodium  Apply 1 application topically 4 (four) times daily as needed.           Total Time in preparing paper work, data evaluation and todays exam - 35 minutes  Dustin Flock M.D on 01/06/2016 at 2:37 PM  [facility]   Office  [phone]

## 2016-01-06 NOTE — Progress Notes (Signed)
[facility]  ROUNDING NOTE   Subjective:   Laying in bed. States she is still feeling weak.   Objective:  Vital signs in last 24 hours:  Temp:  [97.6 F (36.4 C)-99.3 F (37.4 C)] 99.3 F (37.4 C) (03/11 0800) Pulse Rate:  [78-102] 95 (03/11 0800) Resp:  [16-18] 18 (03/11 0800) BP: (127-156)/(51-71) 128/70 mmHg (03/11 0800) SpO2:  [91 %-100 %] 91 % (03/11 0800) Weight:  [129.003 kg (284 lb 6.4 oz)] 129.003 kg (284 lb 6.4 oz) (03/11 0451)  Weight change: 0.816 kg (1 lb 12.8 oz) Filed Weights   01/05/16 0036 01/05/16 0417 01/06/16 0451  Weight: 128.822 kg (284 lb) 128.822 kg (284 lb) 129.003 kg (284 lb 6.4 oz)    Intake/Output: I/O last 3 completed shifts: In: 980 [P.O.:480; IV Piggyback:500] Out: 2100 [Urine:2100]   Intake/Output this shift:     Physical Exam: General: NAD,   Head: Normocephalic, atraumatic. Moist oral mucosal membranes  Eyes: Anicteric, PERRL  Neck: Supple, trachea midline  Lungs:  Clear to auscultation  Heart: Regular rate and rhythm  Abdomen:  Soft, nontender,   Extremities: no peripheral edema.  Neurologic: Nonfocal, moving all four extremities  Skin: No lesions       Basic Metabolic Panel:  Recent Labs Lab 01/04/16 1749 01/04/16 2104 01/05/16 0440 01/05/16 1316  NA 139 136 139 141  K 6.3* 5.4* 5.4* 5.2*  CL 109 107 110 110  CO2 _0 GLUCOSE 88 100* 84 109*  BUN 43* 42* 39* 39*  CREATININE 2.45* 2.32* 2.35* 2.34*  CALCIUM 8.5* 8.1* 8.2* 8.1*    Liver Function Tests:  Recent Labs Lab 01/04/16 1749  AST 19  ALT 9*  ALKPHOS 113  BILITOT 0.4  PROT 7.5  ALBUMIN 3.8    Recent Labs Lab 01/04/16 1749  LIPASE 28   No results for input(s): AMMONIA in the last 168 hours.  CBC:  Recent Labs Lab 01/04/16 1749 01/05/16 0440  WBC 6.6 6.5  NEUTROABS 4.2  --   HGB 8.8* 9.0*  HCT 27.0* 26.9*  MCV 91.4 91.2  PLT 288 276    Cardiac Enzymes:  Recent Labs Lab 01/04/16 1749 01/05/16 0027  01/05/16 0440  TROPONINI <0.03 <0.03 <0.03    BNP: Invalid input(s): POCBNP  CBG: No results for input(s): GLUCAP in the last 168 hours.  Microbiology: No results found for this or any previous visit.  Coagulation Studies: No results for input(s): LABPROT, INR in the last 72 hours.  Urinalysis:  Recent Labs  01/04/16 2104  COLORURINE STRAW*  LABSPEC 1.010  PHURINE 6.0  GLUCOSEU NEGATIVE  HGBUR NEGATIVE  BILIRUBINUR NEGATIVE  KETONESUR NEGATIVE  PROTEINUR 100*  NITRITE NEGATIVE  LEUKOCYTESUR NEGATIVE      Imaging: Ct Abdomen Pelvis Wo Contrast  01/04/2016  CLINICAL DATA:  Patient c/o nausea x 1 day. States she was feeling more nauseated this morning when she went to [facility] today. EMS was called due to patient having a "near syncopal" event. Heart rate dropped to 32 beats per minute. EXAM: CT ABDOMEN AND PELVIS WITHOUT CONTRAST TECHNIQUE: Multidetector CT imaging of the abdomen and pelvis was performed following the standard protocol without IV contrast. COMPARISON:  None. FINDINGS: Lower chest: No pulmonary nodules, pleural effusions, or infiltrates. There is atherosclerotic calcification of the coronary arteries. Heart size is normal. Upper abdomen: No focal abnormality identified within the liver, spleen, pancreas, or adrenal glands. Gallbladder is surgically absent. The kidneys are normal in appearance. No hydronephrosis.  No intrarenal or ureteral stones. Gastrointestinal tract: The stomach and small bowel loops are normal in appearance. The appendix is well seen and has a normal appearance. Colonic diverticulosis. Pelvis: Urinary bladder is significantly distended. Status post hysterectomy. No adnexal mass. No free pelvic fluid. Retroperitoneum: There is mild atherosclerotic calcification of the abdominal aorta. No aneurysm. Abdominal wall: Unremarkable. Osseous structures: There is moderate degenerative change of the lower thoracic and lumbar spine. Superior endplate fracture of  L1 is likely chronic. No suspicious lytic or blastic lesions are identified. IMPRESSION: 1. No acute abnormality of the abdomen and pelvis. 2. Normal appendix. 3. Colonic diverticulosis. 4. Status post cholecystectomy and hysterectomy. 5. Coronary artery disease. 6. Atherosclerosis of the abdominal aorta. 7. Distention of the urinary bladder. 8. Degenerative disc disease. 9. Chronic superior endplate fracture of L1. Electronically Signed   By: Nolon Nations M.D.   On: 01/04/2016 21:18   Dg Chest 2 View  01/04/2016  CLINICAL DATA:  Patient c/o nausea x 1 day. States she was feeling more nauseous this morning when she went to [facility] today. EMS was called due to paitent having a "near syncopal" event. Bradycardia. EXAM: CHEST  2 VIEW COMPARISON:  None. FINDINGS: Low lung volumes are present, causing crowding of the pulmonary vasculature. Mild enlargement of the cardiopericardial silhouette with interstitial accentuation. Borderline airway thickening. Atherosclerotic aortic arch. Left proximal humeral prosthesis. Body habitus reduces diagnostic sensitivity and specificity. Suspected mid thoracic wedge compression, most likely chronic. IMPRESSION: 1. Mild enlargement of the cardiopericardial silhouette. There is upper zone pulmonary vascular prominence which could reflect pulmonary venous hypertension. Faint interstitial accentuation, query mild interstitial edema. 2. Mid thoracic wedge compression, most likely chronic. Electronically Signed   By: Van Clines M.D.   On: 01/04/2016 16:26     Medications:     . amLODipine  5 mg Oral Daily  . aspirin EC  81 mg Oral Daily  . atorvastatin  20 mg Oral QHS  . carvedilol  25 mg Oral BID  . DULoxetine  30 mg Oral Daily  . febuxostat  40 mg Oral Daily  . furosemide  20 mg Oral Daily  . heparin  5,000 Units Subcutaneous 3 times per day  . isosorbide mononitrate  30 mg Oral Daily  . levETIRAcetam  500 mg Oral BID  . pantoprazole  40 mg Oral Daily  .  sodium bicarbonate  650 mg Oral BID  . sodium chloride flush  3 mL Intravenous Q12H   acetaminophen **OR** acetaminophen, ondansetron (ZOFRAN) IV, oxyCODONE, polyethylene glycol, senna-docusate  Assessment/ Plan:  Ms. Sarah Hamilton is a 70 y.o. black female with hypertension, diverticulitis, coronary artery disease, gout, hyperlipidemia, seizure disorder, vitamin D defcieincy, who was admitted to [facility] on 01/04/2016   1. Acute Renal Failure with hyperkalemia with chronic kidney disease stage III with proteinuria: Baseline creatinine of 1.48, eGFR of 42 from 12/21/14. Follows with [facility]. Chronic kidney disease is thoughtto be due to hypertension.  Acute renal failure from dental extraction, lisinopril, and seizure. - Discontinued lisinopril due to hyperkalemia and chronic dry cough. Do not restart until she is seen by her primary nephrologist as an outpatient.  - No acute indication for dialysis. Start sodium bicarbonate for hyperkalemia.   2. Hypertension: blood pressure elevated  - discontinued lisinopril - Continue amlodipine, carvedilol, imdur,  - Start furosemide 76m daily to help with blood pressure and kaliuresis.   Discussed plan with Dr. PPosey Pronto    LOS:  KJuleen China SLurena [id number]/11/[id number] AM

## 2016-01-06 NOTE — Progress Notes (Signed)
Patient ID: Sarah Hamilton, female   DOB: 11/20/1945, 70 y.o.   MRN: [id number] [facility] PROGRESS NOTE  Sarah Hamilton [id number] DOB: [birth year] DOA: 01/04/2016 PCP: No PCP Per Patient  HPI/Subjective:    Patient she complains of having nausea this morning. Pain on the right side of her face. She was seen by physical therapy yesterday and recommended to him home physical therapy. Patient has been getting pain medications every 6 hours  Objective: Filed Vitals:   01/05/16 2012 01/06/16 0451  BP: 127/63 156/71  Pulse: 78 102  Temp: 97.8 F (36.6 C) 99.3 F (37.4 C)  Resp: 16 16    Filed Weights   01/05/16 0036 01/05/16 0417 01/06/16 0451  Weight: 128.822 kg (284 lb) 128.822 kg (284 lb) 129.003 kg (284 lb 6.4 oz)    ROS: Review of Systems  Constitutional: Negative for fever and chills.  Eyes: Negative for blurred vision.  Respiratory: Negative for cough and shortness of breath.   Cardiovascular: Negative for chest pain.  Gastrointestinal: Negative for nausea, vomiting, abdominal pain, diarrhea and constipation.  Genitourinary: Negative for dysuria.  Musculoskeletal: Positive for joint pain.  complains of pain on the right neck Neurological: Positive for weakness. Negative for dizziness and headaches.   Exam: Physical Exam  Constitutional: She is oriented to person, place, and time.  HENT:  Nose: No mucosal edema.  Mouth/Throat: No oropharyngeal exudate or posterior oropharyngeal edema.  Eyes: Conjunctivae, EOM and lids are normal. Pupils are equal, round, and reactive to light.  Neck: No JVD present. Carotid bruit is not present. No edema present. No thyroid mass and no thyromegaly present.  Cardiovascular: S1 normal and S2 normal.  Exam reveals no gallop.   No murmur heard. Pulses:      Dorsalis pedis pulses are 2+ on the right side, and 2+ on the left side.  Respiratory: No respiratory distress. She has no wheezes. She has no rhonchi. She has no  rales.  GI: Soft. Bowel sounds are normal. There is no tenderness.  Musculoskeletal:       Right ankle: She exhibits swelling.       Left ankle: She exhibits swelling.  Right shoulder limited motion with secondary to pain. Left fifth finger recent fracture and bruising.  Lymphadenopathy:    She has no cervical adenopathy.  Neurological: She is alert and oriented to person, place, and time. No cranial nerve deficit.  Skin: Skin is warm. No rash noted. Nails show no clubbing.  Psychiatric: She has a normal mood and affect.      Data Reviewed: Basic Metabolic Panel:  Recent Labs Lab 01/04/16 1749 01/04/16 2104 01/05/16 0440 01/05/16 1316  NA 139 136 139 141  K 6.3* 5.4* 5.4* 5.2*  CL 109 107 110 110  CO2 23 22 23 25   GLUCOSE 88 100* 84 109*  BUN 43* 42* 39* 39*  CREATININE 2.45* 2.32* 2.35* 2.34*  CALCIUM 8.5* 8.1* 8.2* 8.1*   Liver Function Tests:  Recent Labs Lab 01/04/16 1749  AST 19  ALT 9*  ALKPHOS 113  BILITOT 0.4  PROT 7.5  ALBUMIN 3.8    Recent Labs Lab 01/04/16 1749  LIPASE 28   CBC:  Recent Labs Lab 01/04/16 1749 01/05/16 0440  WBC 6.6 6.5  NEUTROABS 4.2  --   HGB 8.8* 9.0*  HCT 27.0* 26.9*  MCV 91.4 91.2  PLT 288 276   Cardiac Enzymes:  Recent Labs Lab 01/04/16 1749 01/05/16 0027 01/05/16 0440  TROPONINI <0.03 <0.03 <0.03  Studies: Ct Abdomen Pelvis Wo Contrast  01/04/2016  CLINICAL DATA:  Patient c/o nausea x 1 day. States she was feeling more nauseated this morning when she went to [facility] today. EMS was called due to patient having a "near syncopal" event. Heart rate dropped to 32 beats per minute. EXAM: CT ABDOMEN AND PELVIS WITHOUT CONTRAST TECHNIQUE: Multidetector CT imaging of the abdomen and pelvis was performed following the standard protocol without IV contrast. COMPARISON:  None. FINDINGS: Lower chest: No pulmonary nodules, pleural effusions, or infiltrates. There is atherosclerotic calcification of the coronary arteries.  Heart size is normal. Upper abdomen: No focal abnormality identified within the liver, spleen, pancreas, or adrenal glands. Gallbladder is surgically absent. The kidneys are normal in appearance. No hydronephrosis. No intrarenal or ureteral stones. Gastrointestinal tract: The stomach and small bowel loops are normal in appearance. The appendix is well seen and has a normal appearance. Colonic diverticulosis. Pelvis: Urinary bladder is significantly distended. Status post hysterectomy. No adnexal mass. No free pelvic fluid. Retroperitoneum: There is mild atherosclerotic calcification of the abdominal aorta. No aneurysm. Abdominal wall: Unremarkable. Osseous structures: There is moderate degenerative change of the lower thoracic and lumbar spine. Superior endplate fracture of L1 is likely chronic. No suspicious lytic or blastic lesions are identified. IMPRESSION: 1. No acute abnormality of the abdomen and pelvis. 2. Normal appendix. 3. Colonic diverticulosis. 4. Status post cholecystectomy and hysterectomy. 5. Coronary artery disease. 6. Atherosclerosis of the abdominal aorta. 7. Distention of the urinary bladder. 8. Degenerative disc disease. 9. Chronic superior endplate fracture of L1. Electronically Signed   By: Nolon Nations M.D.   On: 01/04/2016 21:18   Dg Chest 2 View  01/04/2016  CLINICAL DATA:  Patient c/o nausea x 1 day. States she was feeling more nauseous this morning when she went to [facility] today. EMS was called due to paitent having a "near syncopal" event. Bradycardia. EXAM: CHEST  2 VIEW COMPARISON:  None. FINDINGS: Low lung volumes are present, causing crowding of the pulmonary vasculature. Mild enlargement of the cardiopericardial silhouette with interstitial accentuation. Borderline airway thickening. Atherosclerotic aortic arch. Left proximal humeral prosthesis. Body habitus reduces diagnostic sensitivity and specificity. Suspected mid thoracic wedge compression, most likely chronic. IMPRESSION:  1. Mild enlargement of the cardiopericardial silhouette. There is upper zone pulmonary vascular prominence which could reflect pulmonary venous hypertension. Faint interstitial accentuation, query mild interstitial edema. 2. Mid thoracic wedge compression, most likely chronic. Electronically Signed   By: Van Clines M.D.   On: 01/04/2016 16:26    Scheduled Meds: . amLODipine  5 mg Oral Daily  . aspirin EC  81 mg Oral Daily  . atorvastatin  20 mg Oral QHS  . carvedilol  25 mg Oral BID  . DULoxetine  30 mg Oral Daily  . febuxostat  40 mg Oral Daily  . heparin  5,000 Units Subcutaneous 3 times per day  . isosorbide mononitrate  30 mg Oral Daily  . levETIRAcetam  500 mg Oral BID  . pantoprazole  40 mg Oral Daily  . sodium bicarbonate  650 mg Oral BID  . sodium chloride flush  3 mL Intravenous Q12H    Assessment/Plan:  1. Hyperkalemia. Status post treatment with Kayexalate 2, potassium level this morning pending. We will need outpatient nephrology follow-up 2. Acute on chronic kidney disease stage IV. Given IV fluids lisinopril has been held  3. Syncope. Could be related to dehydration, echocardiogram without any significant cardiac dysfunction cardiac telemetry without any significant arrhythmia 4. Essential hypertension  continue Norvasc and Coreg or lisinopril has been held 5. Seizure history on Keppra no evidence of further seizure 6. Gastroesophageal reflux disease on Protonix 7. History of spinal stenosis has chronic pain and is on chronic pain medications 8. Hyperlipidemia unspecified continue on atorvastatin  Code Status:     Code Status Orders        Start     Ordered   01/04/16 2353  Full code   Continuous     01/04/16 2352    Code Status History    Date Active Date Inactive Code Status Order ID Comments User Context   This patient has a current code status but no historical code status.    Advance Directive Documentation        Most Recent Value   Type of  Advance Directive  Healthcare Power of Attorney   Pre-existing out of facility DNR order (yellow form or pink MOST form)     "MOST" Form in Place?       Disposition Plan: Possible discharge with home physical therapy   Consultants:  Nephrology  Cardiology  Time spent: 25 minutes  [place], [place] [facility]

## 2016-01-06 NOTE — Progress Notes (Signed)
Pt to be discharged today. Iv and tele removed. Tele central notified. disch instructions given to pt and the n to her sister to their understanding. Home health pt sent up by care management. disch via w.c. Accompanied by family

## 2016-01-06 NOTE — Discharge Instructions (Signed)

## 2016-01-06 NOTE — Care Management Note (Signed)
Case Management Note  Patient Details  Name: Sarah Hamilton MRN: [id number] Date of Birth: [birth year]  Subjective/Objective:     A referral for home health PT and nurse Aid was called and faxed to [facility].               Action/Plan:   Expected Discharge Date:                  Expected Discharge Plan:     In-House Referral:     Discharge planning Services     Post Acute Care Choice:    Choice offered to:     DME Arranged:    DME Agency:     HH Arranged:    [place] Agency:     Status of Service:     Medicare Important Message Given:    Date Medicare IM Given:    Medicare IM give by:    Date Additional Medicare IM Given:    Additional Medicare Important Message give by:     If discussed at [place] of Stay Meetings, dates discussed:    Additional Comments:  Margaruite Top A, RN 01/06/2016, 8:37 AM

## 2016-01-31 ENCOUNTER — Other Ambulatory Visit: Payer: Self-pay | Admitting: Gastroenterology

## 2016-01-31 DIAGNOSIS — Z809 Family history of malignant neoplasm, unspecified: Secondary | ICD-10-CM

## 2016-01-31 DIAGNOSIS — R221 Localized swelling, mass and lump, neck: Secondary | ICD-10-CM

## 2016-01-31 DIAGNOSIS — R1310 Dysphagia, unspecified: Secondary | ICD-10-CM

## 2016-02-08 ENCOUNTER — Ambulatory Visit
Admission: RE | Admit: 2016-02-08 | Discharge: 2016-02-08 | Disposition: A | Discharge status: Home / Self Care | Payer: Medicare (Managed Care) | Source: Ambulatory Visit | Attending: Gastroenterology | Admitting: Gastroenterology

## 2016-02-08 DIAGNOSIS — R1310 Dysphagia, unspecified: Secondary | ICD-10-CM | POA: Diagnosis present

## 2016-02-08 DIAGNOSIS — E042 Nontoxic multinodular goiter: Secondary | ICD-10-CM | POA: Diagnosis not present

## 2016-02-08 DIAGNOSIS — Z809 Family history of malignant neoplasm, unspecified: Secondary | ICD-10-CM

## 2016-02-08 DIAGNOSIS — M4800 Spinal stenosis, site unspecified: Secondary | ICD-10-CM | POA: Insufficient documentation

## 2016-02-08 DIAGNOSIS — R221 Localized swelling, mass and lump, neck: Secondary | ICD-10-CM

## 2016-02-08 DIAGNOSIS — K228 Other specified diseases of esophagus: Secondary | ICD-10-CM | POA: Insufficient documentation

## 2016-02-08 DIAGNOSIS — R599 Enlarged lymph nodes, unspecified: Secondary | ICD-10-CM | POA: Diagnosis not present

## 2016-02-22 ENCOUNTER — Encounter: Payer: Self-pay | Admitting: *Deleted

## 2016-02-23 ENCOUNTER — Ambulatory Visit
Admission: RE | Admit: 2016-02-23 | Discharge: 2016-02-23 | Disposition: A | Discharge status: Home / Self Care | Payer: Medicare (Managed Care) | Source: Ambulatory Visit | Attending: Gastroenterology | Admitting: Gastroenterology

## 2016-02-23 ENCOUNTER — Ambulatory Visit: Payer: Medicare (Managed Care) | Admitting: Certified Registered Nurse Anesthetist

## 2016-02-23 ENCOUNTER — Encounter
Admission: RE | Disposition: A | Discharge status: Home / Self Care | Payer: Self-pay | Source: Ambulatory Visit | Attending: Gastroenterology

## 2016-02-23 DIAGNOSIS — Z539 Procedure and treatment not carried out, unspecified reason: Secondary | ICD-10-CM | POA: Insufficient documentation

## 2016-02-23 SURGERY — ESOPHAGOGASTRODUODENOSCOPY (EGD) WITH PROPOFOL
Anesthesia: General

## 2016-02-23 MED ORDER — SODIUM CHLORIDE 0.9 % IV SOLN: INTRAVENOUS | Status: DC

## 2016-02-27 IMAGING — US US RENAL
1 series · 14 of 25 positions shown · non-contrast
Comparison: None.

CLINICAL DATA: Chronic renal disease.

EXAM:
RENAL / URINARY TRACT ULTRASOUND COMPLETE

[Series 1: us renal · 0.20mm/px · 14 of 53 slices shown]
[im 1/53]
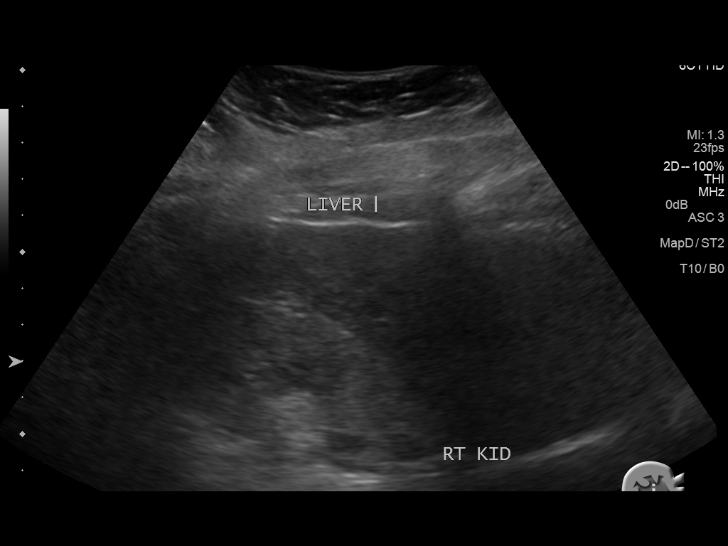
[im 5/53]
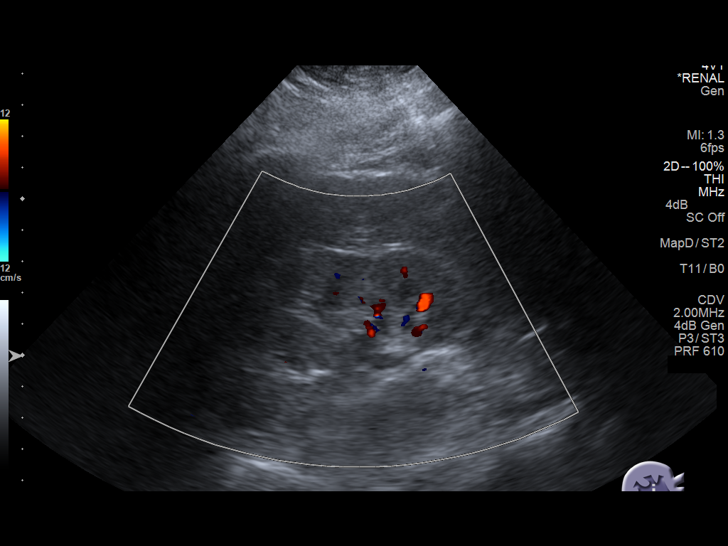
[im 9/53]
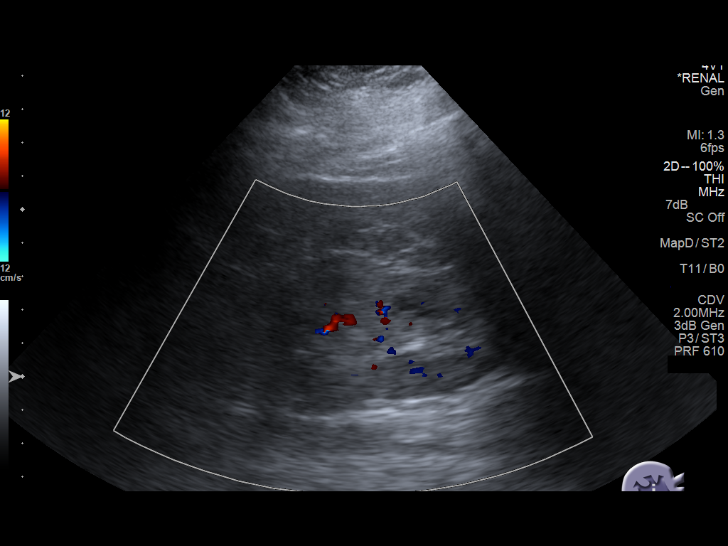
[im 14/53]
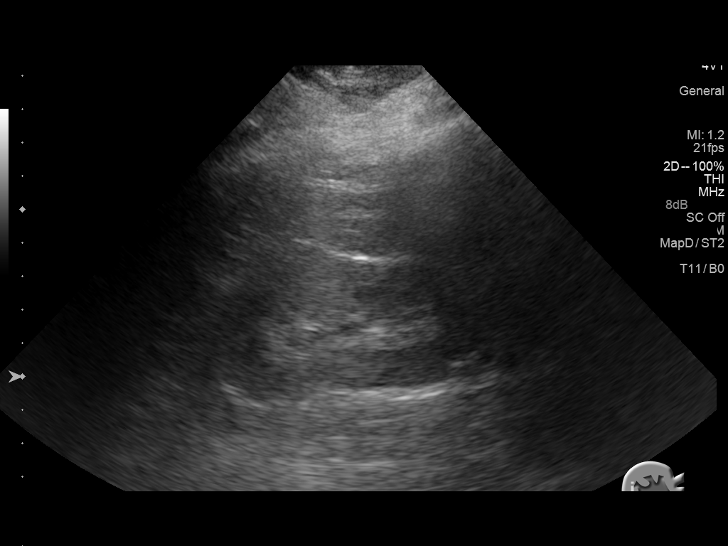
[im 18/53]
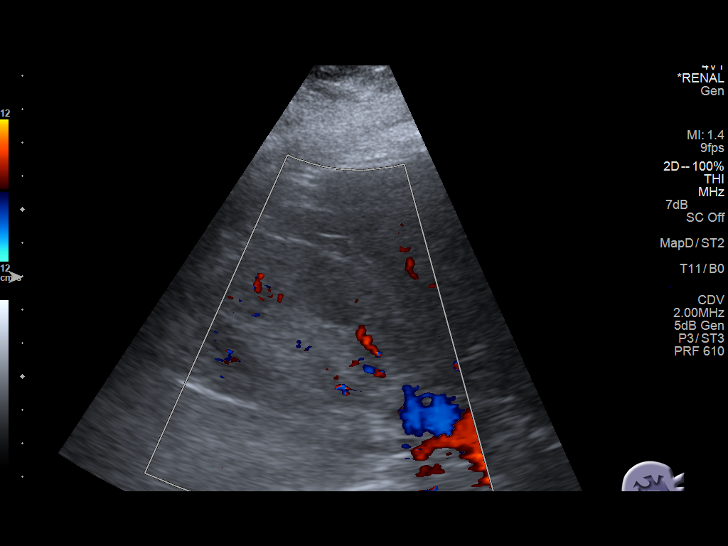
[im 20/53]
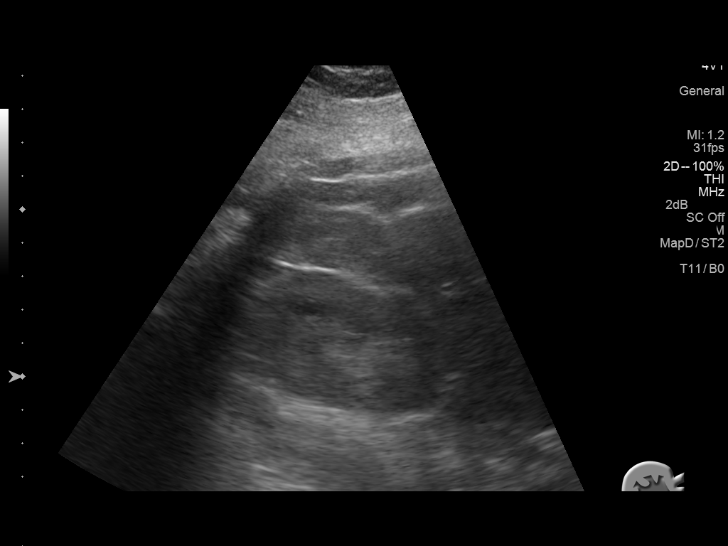
[im 24/53]
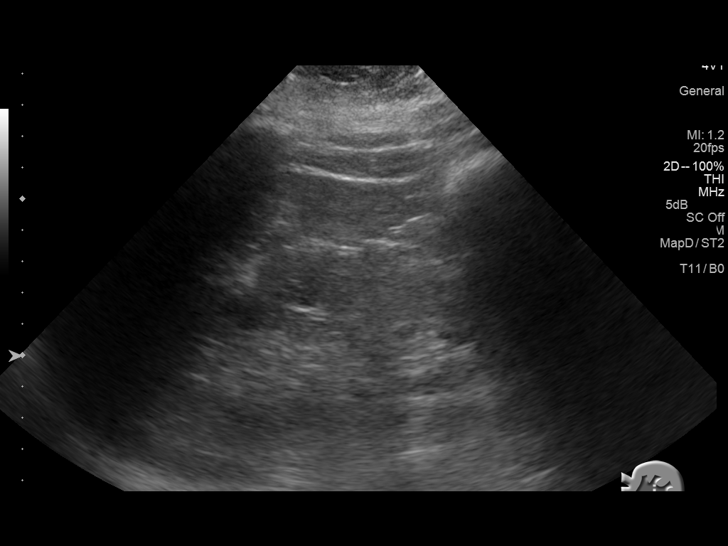
[im 29/53]
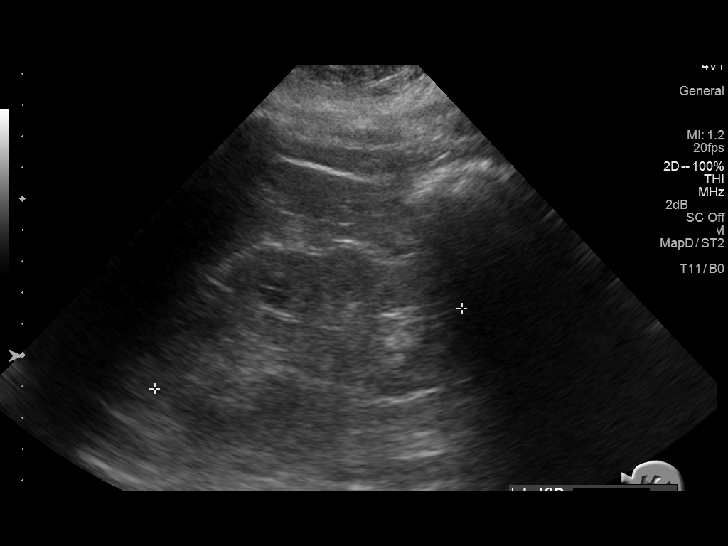
[im 33/53]
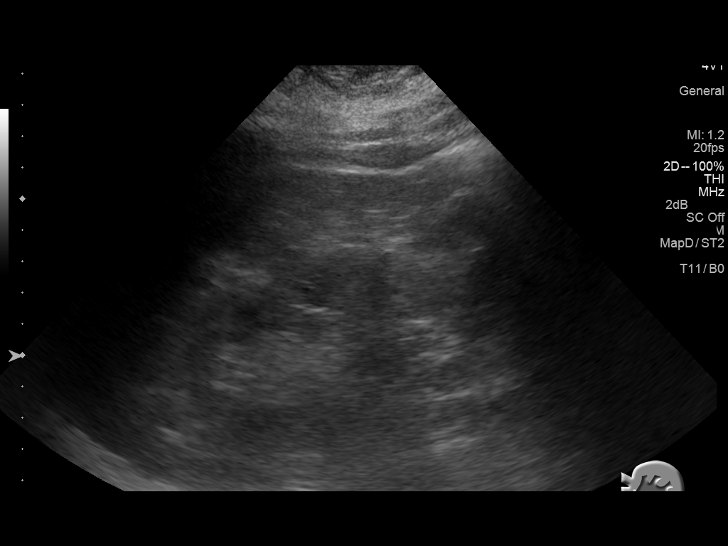
[im 35/53]
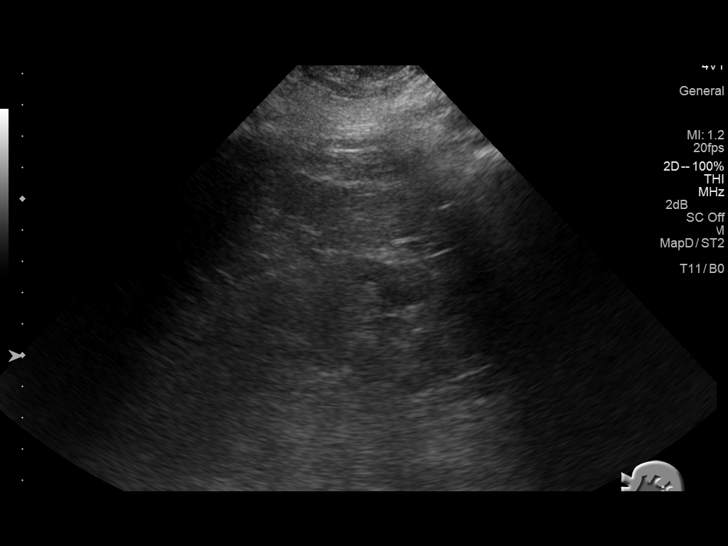
[im 40/53]
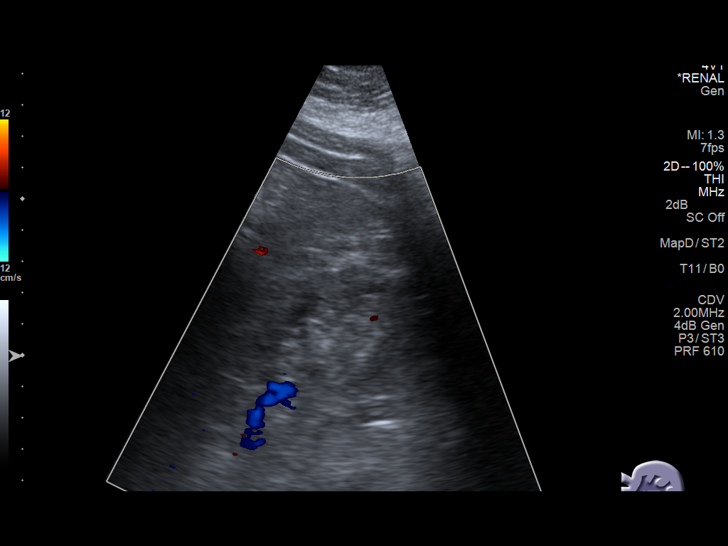
[im 44/53]
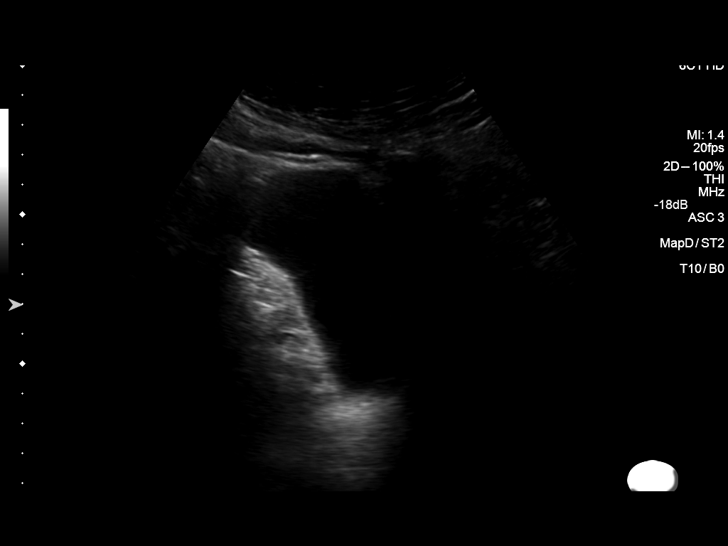
[im 48/53]
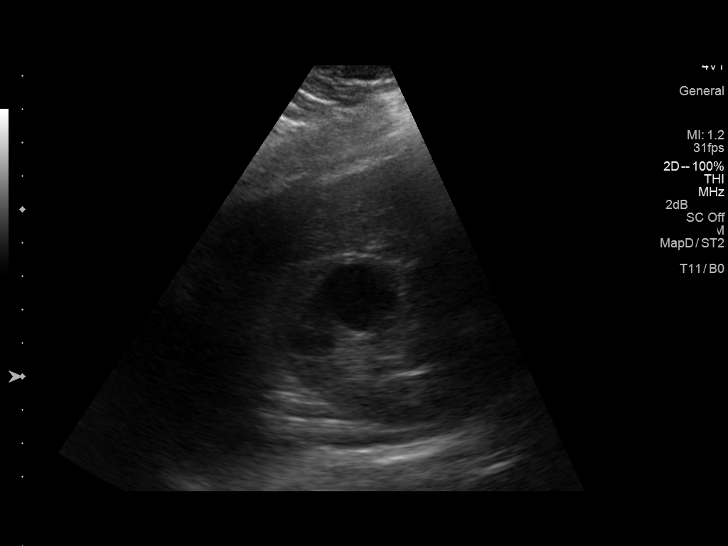
[im 53/53]
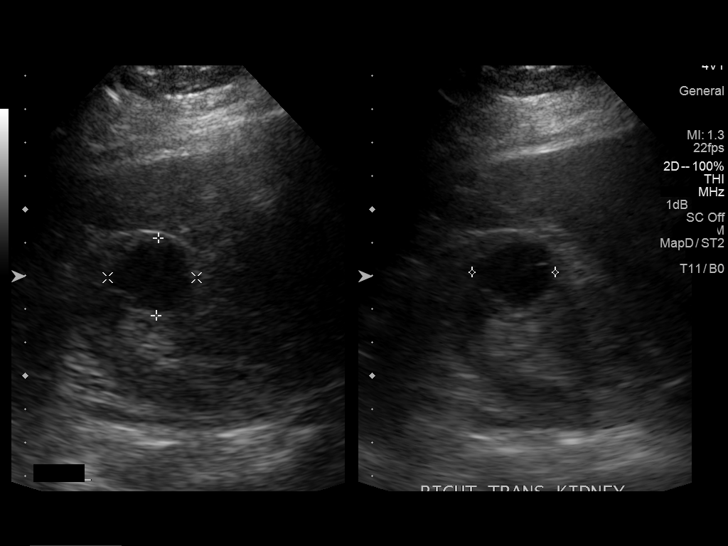

[14 of 25 positions shown; findings below may reference images not displayed]

FINDINGS: Right Kidney:

Length: 10.2 cm. Echogenic consistent chronic medical renal disease.
2.7 cm upper pole simple cyst. No hydronephrosis visualized.

Left Kidney:

Length: 10.2 cm. Echogenic consistent chronic medical renal disease.
No mass or hydronephrosis visualized.

Bladder:

Appears normal for degree of bladder distention.
IMPRESSION: Echogenic kidneys consistent with chronic medical renal disease.
cm simple cyst upper pole right kidney. No hydronephrosis or bladder
distention.

## 2016-04-01 ENCOUNTER — Ambulatory Visit
Admission: RE | Admit: 2016-04-01 | Payer: Medicare (Managed Care) | Source: Ambulatory Visit | Admitting: Unknown Physician Specialty

## 2016-04-01 ENCOUNTER — Encounter: Admission: RE | Payer: Self-pay | Source: Ambulatory Visit

## 2016-04-01 SURGERY — ESOPHAGOGASTRODUODENOSCOPY (EGD) WITH PROPOFOL
Anesthesia: General

## 2016-11-06 ENCOUNTER — Encounter: Payer: Self-pay | Admitting: *Deleted

## 2016-11-06 SURGERY — PHACOEMULSIFICATION, CATARACT, WITH IOL INSERTION
Anesthesia: Choice | Laterality: Left

## 2016-11-07 ENCOUNTER — Ambulatory Visit: Payer: Medicare (Managed Care) | Admitting: Anesthesiology

## 2016-11-07 ENCOUNTER — Encounter: Payer: Self-pay | Admitting: *Deleted

## 2016-11-07 ENCOUNTER — Encounter
Admission: RE | Disposition: A | Discharge status: Home / Self Care | Payer: Self-pay | Source: Ambulatory Visit | Attending: Ophthalmology

## 2016-11-07 ENCOUNTER — Ambulatory Visit
Admission: RE | Admit: 2016-11-07 | Discharge: 2016-11-07 | Disposition: A | Discharge status: Home / Self Care | Payer: Medicare (Managed Care) | Source: Ambulatory Visit | Attending: Ophthalmology | Admitting: Ophthalmology

## 2016-11-07 DIAGNOSIS — D649 Anemia, unspecified: Secondary | ICD-10-CM | POA: Diagnosis not present

## 2016-11-07 DIAGNOSIS — Z79899 Other long term (current) drug therapy: Secondary | ICD-10-CM | POA: Diagnosis not present

## 2016-11-07 DIAGNOSIS — I110 Hypertensive heart disease with heart failure: Secondary | ICD-10-CM | POA: Diagnosis not present

## 2016-11-07 DIAGNOSIS — K219 Gastro-esophageal reflux disease without esophagitis: Secondary | ICD-10-CM | POA: Diagnosis not present

## 2016-11-07 DIAGNOSIS — H2512 Age-related nuclear cataract, left eye: Secondary | ICD-10-CM | POA: Diagnosis present

## 2016-11-07 DIAGNOSIS — I509 Heart failure, unspecified: Secondary | ICD-10-CM | POA: Insufficient documentation

## 2016-11-07 DIAGNOSIS — R0601 Orthopnea: Secondary | ICD-10-CM | POA: Diagnosis not present

## 2016-11-07 DIAGNOSIS — E039 Hypothyroidism, unspecified: Secondary | ICD-10-CM | POA: Insufficient documentation

## 2016-11-07 DIAGNOSIS — I2510 Atherosclerotic heart disease of native coronary artery without angina pectoris: Secondary | ICD-10-CM | POA: Diagnosis not present

## 2016-11-07 HISTORY — DX: Gastro-esophageal reflux disease without esophagitis: K21.9

## 2016-11-07 HISTORY — DX: Heart failure, unspecified: I50.9

## 2016-11-07 HISTORY — DX: Inflammatory liver disease, unspecified: K75.9

## 2016-11-07 HISTORY — DX: Orthopnea: R06.01

## 2016-11-07 HISTORY — DX: Angina pectoris, unspecified: I20.9

## 2016-11-07 HISTORY — DX: Wheezing: R06.2

## 2016-11-07 HISTORY — PX: CATARACT EXTRACTION W/PHACO: SHX586

## 2016-11-07 HISTORY — DX: Palpitations: R00.2

## 2016-11-07 HISTORY — DX: Pain, unspecified: R52

## 2016-11-07 HISTORY — DX: Hypothyroidism, unspecified: E03.9

## 2016-11-07 HISTORY — DX: Tremor, unspecified: R25.1

## 2016-11-07 HISTORY — DX: Edema, unspecified: R60.9

## 2016-11-07 HISTORY — DX: Unspecified osteoarthritis, unspecified site: M19.90

## 2016-11-07 HISTORY — DX: Cardiac murmur, unspecified: R01.1

## 2016-11-07 SURGERY — PHACOEMULSIFICATION, CATARACT, WITH IOL INSERTION
Anesthesia: Monitor Anesthesia Care | Site: Eye | Laterality: Left | Wound class: Clean

## 2016-11-07 MED ORDER — MIDAZOLAM HCL 2 MG/2ML IJ SOLN
INTRAMUSCULAR | Status: AC
  Filled 2016-11-07: qty 2

## 2016-11-07 MED ORDER — MOXIFLOXACIN HCL 0.5 % OP SOLN
1.0000 [drp] | OPHTHALMIC | Status: DC | PRN
Start: 2016-11-07 — End: 2016-11-07

## 2016-11-07 MED ORDER — SODIUM HYALURONATE 23 MG/ML IO SOLN
INTRAOCULAR | Status: AC
  Filled 2016-11-07: qty 0.6

## 2016-11-07 MED ORDER — ONDANSETRON HCL 4 MG/2ML IJ SOLN
INTRAMUSCULAR | Status: AC
  Filled 2016-11-07: qty 2

## 2016-11-07 MED ORDER — POVIDONE-IODINE 5 % OP SOLN
OPHTHALMIC | Status: AC
  Filled 2016-11-07: qty 30

## 2016-11-07 MED ORDER — SODIUM HYALURONATE 10 MG/ML IO SOLN
INTRAOCULAR | Status: DC | PRN
  Administered 2016-11-07: 0.85 mL via INTRAOCULAR

## 2016-11-07 MED ORDER — ARMC OPHTHALMIC DILATING DROPS
OPHTHALMIC | Status: DC
Start: 2016-11-07 — End: 2016-11-07
  Filled 2016-11-07: qty 0.4

## 2016-11-07 MED ORDER — SODIUM HYALURONATE 10 MG/ML IO SOLN
INTRAOCULAR | Status: AC
  Filled 2016-11-07: qty 0.85

## 2016-11-07 MED ORDER — ARMC OPHTHALMIC DILATING DROPS
1.0000 "application " | OPHTHALMIC | Status: DC
  Administered 2016-11-07 (×3): 1 via OPHTHALMIC

## 2016-11-07 MED ORDER — SODIUM HYALURONATE 23 MG/ML IO SOLN
INTRAOCULAR | Status: DC | PRN
  Administered 2016-11-07: 0.6 mL via INTRAOCULAR

## 2016-11-07 MED ORDER — MOXIFLOXACIN HCL 0.5 % OP SOLN
OPHTHALMIC | Status: AC
  Filled 2016-11-07: qty 3

## 2016-11-07 MED ORDER — EPINEPHRINE PF 1 MG/ML IJ SOLN
INTRAMUSCULAR | Status: AC
  Filled 2016-11-07: qty 1

## 2016-11-07 MED ORDER — TRYPAN BLUE 0.06 % OP SOLN
OPHTHALMIC | Status: AC
  Filled 2016-11-07: qty 0.5

## 2016-11-07 MED ORDER — SODIUM CHLORIDE 0.9 % IV SOLN
INTRAVENOUS | Status: DC
  Administered 2016-11-07: 12:00:00 via INTRAVENOUS

## 2016-11-07 MED ORDER — BSS IO SOLN
INTRAOCULAR | Status: DC | PRN
  Administered 2016-11-07: 250 mL via INTRAOCULAR

## 2016-11-07 MED ORDER — MOXIFLOXACIN HCL 0.5 % OP SOLN
OPHTHALMIC | Status: DC | PRN
  Administered 2016-11-07: 9 [drp] via OPHTHALMIC

## 2016-11-07 MED ORDER — ONDANSETRON HCL 4 MG/2ML IJ SOLN
INTRAMUSCULAR | Status: DC | PRN
  Administered 2016-11-07: 4 mg via INTRAVENOUS

## 2016-11-07 MED ORDER — TRYPAN BLUE 0.06 % OP SOLN
OPHTHALMIC | Status: DC | PRN
  Administered 2016-11-07: 0.5 mL via INTRAOCULAR

## 2016-11-07 MED ORDER — MIDAZOLAM HCL 2 MG/2ML IJ SOLN
INTRAMUSCULAR | Status: DC | PRN
  Administered 2016-11-07 (×3): 1 mg via INTRAVENOUS

## 2016-11-07 MED ORDER — LIDOCAINE HCL (PF) 4 % IJ SOLN
INTRAMUSCULAR | Status: AC
  Filled 2016-11-07: qty 5

## 2016-11-07 MED ORDER — LIDOCAINE HCL (PF) 4 % IJ SOLN
INTRAMUSCULAR | Status: DC | PRN
  Administered 2016-11-07: 4 mL via OPHTHALMIC

## 2016-11-07 SURGICAL SUPPLY — 26 items
CANNULA ANT/CHMB 27GA (MISCELLANEOUS) ×12 IMPLANT
CUP MEDICINE 2OZ PLAST GRAD ST (MISCELLANEOUS) ×3 IMPLANT
DISSECTOR HYDRO NUCLEUS 50X22 (MISCELLANEOUS) ×3 IMPLANT
FILTER MILLEX .045 (MISCELLANEOUS) ×1 IMPLANT
FLTR MILLEX .045 (MISCELLANEOUS) ×3
GLOVE BIO SURGEON STRL SZ8 (GLOVE) ×3 IMPLANT
GLOVE BIOGEL M 6.5 STRL (GLOVE) ×3 IMPLANT
GLOVE SURG LX 7.5 STRW (GLOVE) ×2
GLOVE SURG LX STRL 7.5 STRW (GLOVE) ×1 IMPLANT
GOWN STRL REUS W/ TWL LRG LVL3 (GOWN DISPOSABLE) ×2 IMPLANT
GOWN STRL REUS W/TWL LRG LVL3 (GOWN DISPOSABLE) ×4
LENS IOL TECNIS ITEC 23.0 (Intraocular Lens) ×3 IMPLANT
NEEDLE CAPSULORHEX 25GA (NEEDLE) IMPLANT
PACK CATARACT (MISCELLANEOUS) ×3 IMPLANT
PACK CATARACT KING (MISCELLANEOUS) ×3 IMPLANT
PACK EYE AFTER SURG (MISCELLANEOUS) ×3 IMPLANT
RING MALYGIN (MISCELLANEOUS) ×3 IMPLANT
SOL BAL SALT 15ML (MISCELLANEOUS) ×3
SOL BSS BAG (MISCELLANEOUS) ×3
SOLUTION BAL SALT 15ML (MISCELLANEOUS) ×1 IMPLANT
SOLUTION BSS BAG (MISCELLANEOUS) ×1 IMPLANT
SYR 3ML LL SCALE MARK (SYRINGE) ×9 IMPLANT
SYR 5ML LL (SYRINGE) ×3 IMPLANT
SYR TB 1ML 27GX1/2 LL (SYRINGE) ×3 IMPLANT
WATER STERILE IRR 250ML POUR (IV SOLUTION) ×3 IMPLANT
WIPE NON LINTING 3.25X3.25 (MISCELLANEOUS) ×3 IMPLANT

## 2016-11-07 NOTE — Op Note (Signed)
OPERATIVE NOTE  Sarah Hamilton [id number] 11/07/2016   PREOPERATIVE DIAGNOSIS:  Nuclear sclerotic cataract left eye.  H25.12   POSTOPERATIVE DIAGNOSIS:    Nuclear sclerotic cataract left eye.     PROCEDURE:  Complex phacoemusification with posterior chamber intraocular lens placement of the left eye, requiring use of trypan blue for visualization of the anterior capsule and placement of a malyugin ring for enlargement and stabilization of a miotic pupil.  LENS:   Implant Name Type Inv. Item Serial No. Manufacturer Lot No. LRB No. Used  LENS IOL DIOP 23.0 - [id number] Intraocular Lens LENS IOL DIOP 23.0 [id number] AMO   Left 1       PCB00 +23.0   ULTRASOUND TIME: 0 minutes 38 seconds.  CDE 3.86   SURGEON:  Benay Pillow, MD, MPH   ANESTHESIA:  Topical with tetracaine drops augmented with 1% preservative-free intracameral lidocaine.  ESTIMATED BLOOD LOSS: <1 mL   COMPLICATIONS:  None.   DESCRIPTION OF PROCEDURE:  The patient was identified in the holding room and transported to the operating room and placed in the supine position under the operating microscope.  The left eye was identified as the operative eye and it was prepped and draped in the usual sterile ophthalmic fashion.   A 1.0 millimeter clear-corneal paracentesis was made at the 5:00 position. 0.5 ml of preservative-free 1% lidocaine with epinephrine was injected into the anterior chamber.  There was a poor red reflex, so Trypan blue was instilled under an air bubble and rinsed out with BSS to stain the capsule for improved visualization.   The anterior chamber was filled with Healon 5 viscoelastic.  A 2.4 millimeter keratome was used to make a near-clear corneal incision at the 2:00 position.    A malyugin ring was placed.  A curvilinear capsulorrhexis was made with a cystotome and capsulorrhexis forceps.  Balanced salt solution was used to hydrodissect and hydrodelineate the nucleus.   Phacoemulsification was  then used in stop and chop fashion to remove the lens nucleus and epinucleus.  The remaining cortex was then removed using the irrigation and aspiration handpiece. Healon was then placed into the capsular bag to distend it for lens placement.  A lens was then injected into the capsular bag.  The malyugin ring was removed.  The remaining viscoelastic was aspirated.   Wounds were hydrated with balanced salt solution.  The anterior chamber was inflated to a physiologic pressure with balanced salt solution.   Intracameral vigamox 0.1 mL undiltued was injected into the eye and a drop placed onto the ocular surface.  No wound leaks were noted.  The patient was taken to the recovery room in stable condition without complications of anesthesia or surgery  Benay Pillow 11/07/2016, 12:28 PM

## 2016-11-07 NOTE — Transfer of Care (Signed)
Immediate Anesthesia Transfer of Care Note  Patient: Sarah Hamilton  Procedure(s) Performed: Procedure(s) with comments: CATARACT EXTRACTION PHACO AND INTRAOCULAR LENS PLACEMENT (IOC) (Left) - Lot # [id number] H [place]: 00:38.0 AP%: 10.1 CDE: 3.86  Patient Location: PHASE II  Anesthesia Type:MAC  Level of Consciousness: Awake, Alert, Oriented  Airway & Oxygen Therapy: Patient Spontanous Breathing and Patient on room air   Post-op Assessment: Report given to RN and Post -op Vital signs reviewed and stable  Post vital signs: Reviewed and stable  Last Vitals:  Vitals:   11/07/16 1044 11/07/16 1232  BP: 138/70 140/75  Pulse: 72 71  Resp: (!) 22 20  Temp: 36.6 C (!) 64.4 C    Complications: No apparent anesthesia complications

## 2016-11-07 NOTE — Anesthesia Postprocedure Evaluation (Signed)
Anesthesia Post Note  Patient: Sarah Hamilton  Procedure(s) Performed: Procedure(s) (LRB): CATARACT EXTRACTION PHACO AND INTRAOCULAR LENS PLACEMENT (IOC) (Left)  Patient location during evaluation: Other Anesthesia Type: MAC Level of consciousness: awake and alert Pain management: pain level controlled Vital Signs Assessment: post-procedure vital signs reviewed and stable Respiratory status: spontaneous breathing, nonlabored ventilation, respiratory function stable and patient connected to nasal cannula oxygen Cardiovascular status: stable and blood pressure returned to baseline Anesthetic complications: no     Last Vitals:  Vitals:   11/07/16 1044 11/07/16 1232  BP: 138/70 140/75  Pulse: 72 71  Resp: (!) 22 20  Temp: 36.6 C (!) 35.8 C    Last Pain:  Vitals:   11/07/16 1232  TempSrc: Tympanic  PainSc: 8                  Doreen Salvage A

## 2016-11-07 NOTE — OR Nursing (Signed)
Transportation notified for pickup. Pt transferred to w/c and IV discontinued.  Awaiting ride.

## 2016-11-07 NOTE — Anesthesia Procedure Notes (Signed)
Procedure Name: MAC Date/Time: 11/07/2016 11:43 AM Performed by: Doreen Salvage Pre-anesthesia Checklist: Patient identified, Emergency Drugs available, Suction available and Patient being monitored Patient Re-evaluated:Patient Re-evaluated prior to inductionOxygen Delivery Method: Nasal cannula

## 2016-11-07 NOTE — H&P (Signed)
The History and Physical notes are on paper, have been signed, and are to be scanned. The patient remains stable and unchanged from the H&P.   Previous H&P reviewed, patient examined, and there are no changes.  Sarah Hamilton 11/07/2016 11:31 AM

## 2016-11-07 NOTE — Discharge Instructions (Signed)
Eye Surgery Discharge Instructions  Expect mild scratchy sensation or mild soreness. DO NOT RUB YOUR EYE!  The day of surgery:  Minimal physical activity, but bed rest is not required  No reading, computer work, or close hand work  No bending, lifting, or straining.  May watch TV  For 24 hours:  No driving, legal decisions, or alcoholic beverages  Safety precautions  Eat anything you prefer: It is better to start with liquids, then soup then solid foods.  _____ Eye patch should be worn until postoperative exam tomorrow.  ____ Solar shield eyeglasses should be worn for comfort in the sunlight/patch while sleeping  Resume all regular medications including aspirin or Coumadin if these were discontinued prior to surgery. You may shower, bathe, shave, or wash your hair. Tylenol may be taken for mild discomfort.  Call your doctor if you experience significant pain, nausea, or vomiting, fever > 101 or other signs of infection. [phone] or [phone] Specific instructions:  Follow-up Information    Benay Pillow, MD Follow up on 11/08/2016.   Specialty:  Ophthalmology Why:  appoitment at 1030 with Dr Gaetano Hawthorne information: [address] [place] [address] [phone]

## 2016-11-07 NOTE — Anesthesia Preprocedure Evaluation (Signed)
Anesthesia Evaluation  Patient identified by MRN, date of birth, ID band Patient awake    Reviewed: Allergy & Precautions, NPO status , Patient's Chart, lab work & pertinent test results  History of Anesthesia Complications Negative for: history of anesthetic complications  Airway Mallampati: IV       Dental   Pulmonary           Cardiovascular hypertension, Pt. on medications + CAD, +CHF and + Orthopnea  + Valvular Problems/Murmurs      Neuro/Psych Seizures -, Well Controlled,     GI/Hepatic GERD  Medicated and Controlled,(+) Hepatitis -, B  Endo/Other  Hypothyroidism   Renal/GU Renal Insufficiency     Musculoskeletal   Abdominal   Peds  Hematology  (+) anemia ,   Anesthesia Other Findings   Reproductive/Obstetrics                             Anesthesia Physical Anesthesia Plan  ASA: IV  Anesthesia Plan: MAC   Post-op Pain Management:    Induction: Intravenous  Airway Management Planned:   Additional Equipment:   Intra-op Plan:   Post-operative Plan:   Informed Consent: I have reviewed the patients History and Physical, chart, labs and discussed the procedure including the risks, benefits and alternatives for the proposed anesthesia with the patient or authorized representative who has indicated his/her understanding and acceptance.     Plan Discussed with:   Anesthesia Plan Comments:         Anesthesia Quick Evaluation

## 2017-01-07 ENCOUNTER — Encounter: Payer: Self-pay | Admitting: *Deleted

## 2017-01-09 ENCOUNTER — Encounter: Payer: Self-pay | Admitting: *Deleted

## 2017-01-09 ENCOUNTER — Ambulatory Visit
Admission: RE | Admit: 2017-01-09 | Discharge: 2017-01-09 | Disposition: A | Discharge status: Home / Self Care | Payer: Medicare (Managed Care) | Source: Ambulatory Visit | Attending: Ophthalmology | Admitting: Ophthalmology

## 2017-01-09 ENCOUNTER — Ambulatory Visit: Payer: Medicare (Managed Care) | Admitting: Anesthesiology

## 2017-01-09 ENCOUNTER — Encounter
Admission: RE | Disposition: A | Discharge status: Home / Self Care | Payer: Self-pay | Source: Ambulatory Visit | Attending: Ophthalmology

## 2017-01-09 DIAGNOSIS — I110 Hypertensive heart disease with heart failure: Secondary | ICD-10-CM | POA: Insufficient documentation

## 2017-01-09 DIAGNOSIS — H2511 Age-related nuclear cataract, right eye: Secondary | ICD-10-CM | POA: Diagnosis present

## 2017-01-09 DIAGNOSIS — R0601 Orthopnea: Secondary | ICD-10-CM | POA: Diagnosis not present

## 2017-01-09 DIAGNOSIS — Z79899 Other long term (current) drug therapy: Secondary | ICD-10-CM | POA: Insufficient documentation

## 2017-01-09 DIAGNOSIS — M1990 Unspecified osteoarthritis, unspecified site: Secondary | ICD-10-CM | POA: Diagnosis not present

## 2017-01-09 DIAGNOSIS — N184 Chronic kidney disease, stage 4 (severe): Secondary | ICD-10-CM | POA: Insufficient documentation

## 2017-01-09 DIAGNOSIS — E039 Hypothyroidism, unspecified: Secondary | ICD-10-CM | POA: Insufficient documentation

## 2017-01-09 DIAGNOSIS — I130 Hypertensive heart and chronic kidney disease with heart failure and stage 1 through stage 4 chronic kidney disease, or unspecified chronic kidney disease: Secondary | ICD-10-CM | POA: Insufficient documentation

## 2017-01-09 DIAGNOSIS — I2510 Atherosclerotic heart disease of native coronary artery without angina pectoris: Secondary | ICD-10-CM | POA: Insufficient documentation

## 2017-01-09 DIAGNOSIS — I509 Heart failure, unspecified: Secondary | ICD-10-CM | POA: Diagnosis not present

## 2017-01-09 DIAGNOSIS — D649 Anemia, unspecified: Secondary | ICD-10-CM | POA: Insufficient documentation

## 2017-01-09 DIAGNOSIS — M109 Gout, unspecified: Secondary | ICD-10-CM | POA: Insufficient documentation

## 2017-01-09 DIAGNOSIS — K219 Gastro-esophageal reflux disease without esophagitis: Secondary | ICD-10-CM | POA: Diagnosis not present

## 2017-01-09 DIAGNOSIS — E785 Hyperlipidemia, unspecified: Secondary | ICD-10-CM | POA: Insufficient documentation

## 2017-01-09 HISTORY — DX: Dyspnea, unspecified: R06.00

## 2017-01-09 HISTORY — PX: CATARACT EXTRACTION W/PHACO: SHX586

## 2017-01-09 SURGERY — PHACOEMULSIFICATION, CATARACT, WITH IOL INSERTION
Anesthesia: Monitor Anesthesia Care | Site: Eye | Laterality: Right | Wound class: Clean

## 2017-01-09 MED ORDER — SODIUM HYALURONATE 10 MG/ML IO SOLN
INTRAOCULAR | Status: AC
  Filled 2017-01-09: qty 0.85

## 2017-01-09 MED ORDER — MOXIFLOXACIN HCL 0.5 % OP SOLN
OPHTHALMIC | Status: AC
  Filled 2017-01-09: qty 3

## 2017-01-09 MED ORDER — SODIUM HYALURONATE 23 MG/ML IO SOLN
INTRAOCULAR | Status: DC | PRN
  Administered 2017-01-09: 0.6 mL via INTRAOCULAR

## 2017-01-09 MED ORDER — ARMC OPHTHALMIC DILATING DROPS
OPHTHALMIC | Status: AC
  Administered 2017-01-09: 1 via OPHTHALMIC
  Filled 2017-01-09: qty 0.4

## 2017-01-09 MED ORDER — EPINEPHRINE PF 1 MG/ML IJ SOLN
INTRAMUSCULAR | Status: DC | PRN
  Administered 2017-01-09: 11:00:00 via OPHTHALMIC

## 2017-01-09 MED ORDER — MOXIFLOXACIN HCL 0.5 % OP SOLN
OPHTHALMIC | Status: DC | PRN
  Administered 2017-01-09: 0.2 mL via OPHTHALMIC

## 2017-01-09 MED ORDER — LIDOCAINE HCL (PF) 4 % IJ SOLN
INTRAOCULAR | Status: DC | PRN
  Administered 2017-01-09: 4 mL via OPHTHALMIC

## 2017-01-09 MED ORDER — MIDAZOLAM HCL 2 MG/2ML IJ SOLN
INTRAMUSCULAR | Status: DC | PRN
  Administered 2017-01-09: 0.5 mg via INTRAVENOUS
  Administered 2017-01-09: 1 mg via INTRAVENOUS

## 2017-01-09 MED ORDER — EPINEPHRINE PF 1 MG/ML IJ SOLN
INTRAMUSCULAR | Status: AC
  Filled 2017-01-09: qty 2

## 2017-01-09 MED ORDER — CARBACHOL 0.01 % IO SOLN
INTRAOCULAR | Status: DC | PRN
  Administered 2017-01-09: 0.5 mL via INTRAOCULAR

## 2017-01-09 MED ORDER — POVIDONE-IODINE 5 % OP SOLN
OPHTHALMIC | Status: DC | PRN
  Administered 2017-01-09: 1 via OPHTHALMIC

## 2017-01-09 MED ORDER — TRYPAN BLUE 0.06 % OP SOLN
OPHTHALMIC | Status: AC
  Filled 2017-01-09: qty 0.5

## 2017-01-09 MED ORDER — SODIUM CHLORIDE 0.9 % IV SOLN
INTRAVENOUS | Status: DC
  Administered 2017-01-09: 09:00:00 via INTRAVENOUS

## 2017-01-09 MED ORDER — FENTANYL CITRATE (PF) 100 MCG/2ML IJ SOLN
INTRAMUSCULAR | Status: AC
  Filled 2017-01-09: qty 2

## 2017-01-09 MED ORDER — TRYPAN BLUE 0.06 % OP SOLN
OPHTHALMIC | Status: DC | PRN
  Administered 2017-01-09: 0.5 mL via INTRAOCULAR

## 2017-01-09 MED ORDER — SODIUM HYALURONATE 10 MG/ML IO SOLN
INTRAOCULAR | Status: DC | PRN
Start: 2017-01-09 — End: 2017-01-09
  Administered 2017-01-09: 0.85 mL via INTRAOCULAR

## 2017-01-09 MED ORDER — FENTANYL CITRATE (PF) 100 MCG/2ML IJ SOLN
INTRAMUSCULAR | Status: DC | PRN
  Administered 2017-01-09 (×2): 25 ug via INTRAVENOUS

## 2017-01-09 MED ORDER — POVIDONE-IODINE 5 % OP SOLN
OPHTHALMIC | Status: AC
  Filled 2017-01-09: qty 30

## 2017-01-09 MED ORDER — MIDAZOLAM HCL 2 MG/2ML IJ SOLN
INTRAMUSCULAR | Status: AC
  Filled 2017-01-09: qty 2

## 2017-01-09 MED ORDER — MOXIFLOXACIN HCL 0.5 % OP SOLN: 1.0000 [drp] | OPHTHALMIC | Status: DC | PRN

## 2017-01-09 MED ORDER — ARMC OPHTHALMIC DILATING DROPS
1.0000 "application " | OPHTHALMIC | Status: AC
  Administered 2017-01-09 (×3): 1 via OPHTHALMIC

## 2017-01-09 MED ORDER — ACETAMINOPHEN 500 MG PO TABS
1000.0000 mg | ORAL_TABLET | Freq: Once | ORAL | Status: AC
  Administered 2017-01-09: 1000 mg via ORAL

## 2017-01-09 MED ORDER — SODIUM HYALURONATE 23 MG/ML IO SOLN
INTRAOCULAR | Status: AC
  Filled 2017-01-09: qty 0.6

## 2017-01-09 MED ORDER — ACETAMINOPHEN 500 MG PO TABS
ORAL_TABLET | ORAL | Status: AC
  Administered 2017-01-09: 1000 mg via ORAL
  Filled 2017-01-09: qty 2

## 2017-01-09 SURGICAL SUPPLY — 22 items
CANNULA ANT/CHMB 27GA (MISCELLANEOUS) ×6 IMPLANT
CUP MEDICINE 2OZ PLAST GRAD ST (MISCELLANEOUS) ×3 IMPLANT
DISSECTOR HYDRO NUCLEUS 50X22 (MISCELLANEOUS) ×3 IMPLANT
FILTER MILLEX .045 (MISCELLANEOUS) ×1 IMPLANT
FLTR MILLEX .045 (MISCELLANEOUS) ×3
GLOVE BIO SURGEON STRL SZ8 (GLOVE) ×3 IMPLANT
GLOVE BIOGEL M 6.5 STRL (GLOVE) ×3 IMPLANT
GLOVE SURG LX 7.5 STRW (GLOVE) ×2
GLOVE SURG LX STRL 7.5 STRW (GLOVE) ×1 IMPLANT
GOWN STRL REUS W/ TWL LRG LVL3 (GOWN DISPOSABLE) ×2 IMPLANT
GOWN STRL REUS W/TWL LRG LVL3 (GOWN DISPOSABLE) ×4
LENS IOL TECNIS ITEC 22.5 (Intraocular Lens) ×3 IMPLANT
PACK CATARACT (MISCELLANEOUS) ×3 IMPLANT
PACK CATARACT KING (MISCELLANEOUS) ×3 IMPLANT
PACK EYE AFTER SURG (MISCELLANEOUS) ×3 IMPLANT
SOL BSS BAG (MISCELLANEOUS) ×3
SOLUTION BSS BAG (MISCELLANEOUS) ×1 IMPLANT
SYR 3ML LL SCALE MARK (SYRINGE) ×6 IMPLANT
SYR 5ML LL (SYRINGE) ×3 IMPLANT
SYR TB 1ML 27GX1/2 LL (SYRINGE) ×3 IMPLANT
WATER STERILE IRR 250ML POUR (IV SOLUTION) ×3 IMPLANT
WIPE NON LINTING 3.25X3.25 (MISCELLANEOUS) ×3 IMPLANT

## 2017-01-09 NOTE — Anesthesia Postprocedure Evaluation (Signed)
Anesthesia Post Note  Patient: Sarah Hamilton  Procedure(s) Performed: Procedure(s) (LRB): CATARACT EXTRACTION PHACO AND INTRAOCULAR LENS PLACEMENT (IOC) (Right)  Patient location during evaluation: PACU Anesthesia Type: MAC Level of consciousness: awake and alert and oriented Pain management: pain level controlled Vital Signs Assessment: post-procedure vital signs reviewed and stable Respiratory status: spontaneous breathing, nonlabored ventilation and respiratory function stable Cardiovascular status: stable and blood pressure returned to baseline Anesthetic complications: no     Last Vitals:  Vitals:   01/09/17 1117 01/09/17 1120  BP: (!) 153/81 (!) 153/91  Pulse: 74 70  Resp: (!) 21 20  Temp:  36.8 C    Last Pain:  Vitals:   01/09/17 1120  TempSrc: Temporal  PainSc: 7                  Ferne Ellingwood

## 2017-01-09 NOTE — Anesthesia Procedure Notes (Signed)
Procedure Name: MAC Date/Time: 01/09/2017 10:31 AM Performed by: Darlyne Russian Pre-anesthesia Checklist: Patient identified, Emergency Drugs available, Suction available, Patient being monitored and Timeout performed Patient Re-evaluated:Patient Re-evaluated prior to inductionOxygen Delivery Method: Nasal cannula

## 2017-01-09 NOTE — Anesthesia Post-op Follow-up Note (Cosign Needed)
Anesthesia QCDR form completed.        

## 2017-01-09 NOTE — Progress Notes (Signed)
Patient given po tylenol per Dr. Melony Overly order.  Patient States she normally takes pain medication at home And has not had since around 6pm last night.  No  Acute distress,  Son remains at bedside.

## 2017-01-09 NOTE — Progress Notes (Signed)
Patient resting with her eyes closed in chair, no acute Distress.  Son remains at bedside.  States her eye Still feels rough.

## 2017-01-09 NOTE — Op Note (Signed)
OPERATIVE NOTE  Sarah Hamilton [id number] 01/09/2017   PREOPERATIVE DIAGNOSIS:  Nuclear sclerotic cataract right eye.  H25.11   POSTOPERATIVE DIAGNOSIS:    Nuclear sclerotic cataract right eye.     PROCEDURE:  Complex Phacoemusification with posterior chamber intraocular lens placement of the right eye, requiring use of trypan blue for visualization of the anterior capsule CPT [phone]  LENS:   Implant Name Type Inv. Item Serial No. Manufacturer Lot No. LRB No. Used  LENS IOL DIOP 22.5 - [id number] Intraocular Lens LENS IOL DIOP 22.5 [id number] AMO   Right 1       PCB00 +22.5   ULTRASOUND TIME: 0 minutes 54.8 seconds.  CDE 2.96   SURGEON:  Benay Pillow, MD, MPH  ANESTHESIOLOGIST: Anesthesiologist: Emmie Niemann, MD CRNA: Darlyne Russian, CRNA   ANESTHESIA:  Topical with tetracaine drops augmented with 1% preservative-free intracameral lidocaine.  ESTIMATED BLOOD LOSS: less than 1 mL.   COMPLICATIONS:  None.   DESCRIPTION OF PROCEDURE:  The patient was identified in the holding room and transported to the operating room and placed in the supine position under the operating microscope.  The right eye was identified as the operative eye and it was prepped and draped in the usual sterile ophthalmic fashion.   A 1.0 millimeter clear-corneal paracentesis was made at the 10:30 position. 0.5 ml of preservative-free 1% lidocaine with epinephrine was injected into the anterior chamber.  There was a poor red reflex, so Trypan blue was instilled under an air bubble and rinsed out with BSS to stain the capsule for improved visualization.   The anterior chamber was filled with Healon 5 viscoelastic.  A 2.4 millimeter keratome was used to make a near-clear corneal incision at the 8:00 position.  A curvilinear capsulorrhexis was made with a cystotome and capsulorrhexis forceps.  Balanced salt solution was used to hydrodissect and hydrodelineate the nucleus.   Phacoemulsification was then used  in stop and chop fashion to remove the lens nucleus and epinucleus.  The remaining cortex was then removed using the irrigation and aspiration handpiece. Healon was then placed into the capsular bag to distend it for lens placement.  A lens was then injected into the capsular bag.  The remaining viscoelastic was aspirated.   Wounds were hydrated with balanced salt solution.  The anterior chamber was inflated to a physiologic pressure with balanced salt solution.   Intracameral vigamox 0.1 mL undiluted was injected into the eye and a drop placed onto the ocular surface.  No wound leaks were noted.  The patient was taken to the recovery room in stable condition without complications of anesthesia or surgery  Benay Pillow 01/09/2017, 11:12 AM

## 2017-01-09 NOTE — Progress Notes (Signed)
Dr. Edison Pace in with patient, put a medication drop In her Right eye and she states it feels better already.  Brandon With discharging patient home.

## 2017-01-09 NOTE — Anesthesia Preprocedure Evaluation (Addendum)
Anesthesia Evaluation  Patient identified by MRN, date of birth, ID band Patient awake    Reviewed: Allergy & Precautions, NPO status , Patient's Chart, lab work & pertinent test results  History of Anesthesia Complications Negative for: history of anesthetic complications  Airway Mallampati: III  TM Distance: >3 FB Neck ROM: Full    Dental  (+) Poor Dentition   Pulmonary shortness of breath, neg sleep apnea, neg COPD,    breath sounds clear to auscultation- rhonchi (-) wheezing      Cardiovascular Exercise Tolerance: Poor hypertension, Pt. on medications + angina + CAD, + Cardiac Stents (PCI in 2011 and the RCA and left circumflex distribution), +CHF (diastolic) and + Orthopnea   Rhythm:Regular Rate:Normal - Systolic murmurs and - Diastolic murmurs Echo 12/20/95: - Left ventricle: The cavity size was normal. Systolic function was   normal. The estimated ejection fraction was in the range of 55%   to 60%. - Aortic valve: Valve area (Vmax): 2.45 cm^2. - Mitral valve: There was mild regurgitation.   Neuro/Psych Seizures -,  PSYCHIATRIC DISORDERS (dementia)    GI/Hepatic GERD  ,(+) Hepatitis -, B  Endo/Other  neg diabetesHypothyroidism   Renal/GU CRFRenal disease     Musculoskeletal  (+) Arthritis ,   Abdominal (+) + obese,   Peds  Hematology  (+) anemia ,   Anesthesia Other Findings Past Medical History: No date: Anemia No date: Anginal pain (HCC) No date: Arthritis No date: CHF (congestive heart failure) (HCC) No date: CKD (chronic kidney disease), stage IV (HCC) No date: Coronary artery disease No date: Dementia No date: Diverticulitis No date: Diverticulosis No date: Dysphagia No date: Dyspnea No date: Edema     Comment: FEET/LEGS No date: GERD (gastroesophageal reflux disease) No date: Gout No date: Heart murmur No date: Hepatitis     Comment: B No date: Hyperlipidemia No date: Hyperparathyroidism  ([place]) No date: Hypertension No date: Hypothyroidism No date: Orthopnea No date: Pain     Comment: CHRONIC PAIN SYNDROME No date: Palpitations No date: Seizures (HCC) No date: Spinal stenosis No date: Tremor     Comment: ESSENTIAL No date: Wheezing   Reproductive/Obstetrics                            Anesthesia Physical Anesthesia Plan  ASA: IV  Anesthesia Plan: MAC   Post-op Pain Management:    Induction: Intravenous  Airway Management Planned: Natural Airway  Additional Equipment:   Intra-op Plan:   Post-operative Plan:   Informed Consent: I have reviewed the patients History and Physical, chart, labs and discussed the procedure including the risks, benefits and alternatives for the proposed anesthesia with the patient or authorized representative who has indicated his/her understanding and acceptance.     Plan Discussed with: CRNA and Anesthesiologist  Anesthesia Plan Comments:         Anesthesia Quick Evaluation

## 2017-01-09 NOTE — H&P (Signed)
The History and Physical notes are on paper, have been signed, and are to be scanned.   I have examined the patient and there are no changes to the H&P.   Sarah Hamilton 01/09/2017 9:36 AM

## 2017-01-09 NOTE — Progress Notes (Signed)
Patient and son state they are ready for discharge. Calling for their ride home.  Patient very appreciative For her care.

## 2017-01-09 NOTE — Transfer of Care (Signed)
Immediate Anesthesia Transfer of Care Note  Patient: Sarah Hamilton  Procedure(s) Performed: Procedure(s) with comments: CATARACT EXTRACTION PHACO AND INTRAOCULAR LENS PLACEMENT (IOC) (Right) - [place] 54.8 AP% 5.4 CDE 2.96 Fluid pack lot # [id number]  Patient Location: PACU  Anesthesia Type:MAC  Level of Consciousness: awake, alert  and oriented  Airway & Oxygen Therapy: Patient Spontanous Breathing  Post-op Assessment: Report given to RN and Post -op Vital signs reviewed and stable  Post vital signs: Reviewed and stable  Last Vitals:  Vitals:   01/09/17 0918 01/09/17 1117  BP: 120/60 (!) 153/81  Pulse: 75 74  Resp: 16 (!) 21  Temp: 36.8 C     Last Pain:  Vitals:   01/09/17 1117  TempSrc: Temporal         Complications: No apparent anesthesia complications

## 2017-01-10 ENCOUNTER — Encounter: Payer: Self-pay | Admitting: Ophthalmology

## 2017-04-22 ENCOUNTER — Ambulatory Visit (INDEPENDENT_AMBULATORY_CARE_PROVIDER_SITE_OTHER): Payer: Medicare (Managed Care) | Admitting: Vascular Surgery

## 2017-04-22 ENCOUNTER — Encounter (INDEPENDENT_AMBULATORY_CARE_PROVIDER_SITE_OTHER): Payer: Self-pay | Admitting: Vascular Surgery

## 2017-04-22 VITALS — BP 184/71 | HR 68 | Resp 16 | Ht 62.0 in | Wt 284.0 lb

## 2017-04-22 DIAGNOSIS — N184 Chronic kidney disease, stage 4 (severe): Secondary | ICD-10-CM

## 2017-04-22 DIAGNOSIS — I10 Essential (primary) hypertension: Secondary | ICD-10-CM | POA: Diagnosis not present

## 2017-04-22 DIAGNOSIS — I2510 Atherosclerotic heart disease of native coronary artery without angina pectoris: Secondary | ICD-10-CM | POA: Diagnosis not present

## 2017-04-22 DIAGNOSIS — E875 Hyperkalemia: Secondary | ICD-10-CM

## 2017-04-22 NOTE — Assessment & Plan Note (Signed)
lipid control important in reducing the progression of atherosclerotic disease. Continue statin therapy  

## 2017-04-22 NOTE — Assessment & Plan Note (Signed)
Recommend: Patient has chronic kidney disease nearing dialysis dependence. At this time the patient does not have appropriate extremity access for dialysis The patient is right-hand dominant. Vein mapping will be performed to see what access options are available at this time. Patient should have a fistula created if the anatomy is favorable. The anatomy was not favorable for AV fistula creation, I would wait until closer to the initiation of dialysis to place an AV graft. I will see the patient back after her vein mapping. The risks, benefits and alternative therapies were reviewed in detail with the patient.  All questions were answered.  The patient agrees to proceed with surgery.

## 2017-04-22 NOTE — Patient Instructions (Signed)
AV Fistula Placement  Arteriovenous (AV) fistula placement is a surgical procedure to create a connection between a blood vessel that carries blood away from your heart (artery) and a blood vessel that returns blood to your heart (vein). The connection is called a fistula. It is often made in the forearm or upper arm.  You may need this procedure if you are getting hemodialysis treatments for kidney disease. An AV fistula makes your vein larger and stronger over several months. This makes the vein a safe and easy spot to insert the needles that are used for hemodialysis.  Tell a health care provider about:  · Any allergies you have.  · All medicines you are taking, including vitamins, herbs, eye drops, creams, and over-the-counter medicines.  · Any problems you or family members have had with anesthetic medicines.  · Any blood disorders you have.  · Any surgeries you have had.  · Any medical conditions you have.  What are the risks?  Generally, this is a safe procedure. However, problems may occur, including:  · Infection.  · Blood clot (thrombosis).  · Reduced blood flow (stenosis).  · Weakening or ballooning out of the fistula (aneurysm).  · Bleeding.  · Allergic reactions to medicines.  · Nerve damage.  · Swelling near the fistula (lymphedema).  · Weakening of your heart (congestive heart failure).  · Failure of the procedure.    What happens before the procedure?  · Imaging tests of your arm may be done to find the best place for the fistula.  · Ask your health care provider about:  ? Changing or stopping your regular medicines. This is especially important if you are taking diabetes medicines or blood thinners.  ? Taking medicines such as aspirin and ibuprofen. These medicines can thin your blood. Do not take these medicines before your procedure if your health care provider instructs you not to.  · Follow instructions from your health care provider about eating or drinking restrictions.  · You may be given  antibiotic medicine to help prevent infection.  · Ask your health care provider how your surgical site will be marked or identified.  · Plan to have someone take you home after the procedure.  What happens during the procedure?  · To reduce your risk of infection:  ? Your health care team will wash or sanitize their hands.  ? Your skin will be washed with soap.  ? Hair may be removed from the surgical area.  · An IV tube will be started in one of your veins.  · You will be given one or more of the following:  ? A medicine to help you relax (sedative).  ? A medicine to numb the area (local anesthetic).  ? A medicine to make you fall asleep (general anesthetic).  ? A medicine that is injected into an area of your body to numb everything below the injection site (regional anesthetic).  · The fistula site will be cleaned with a germ-killing solution (antiseptic).  · A cut (incision) will be made on the inner side of your arm.  · A vein and an artery will be opened and connected with stitches (sutures).  · The incision will be closed with sutures or clips.  · A bandage (dressing) will be placed over the area.  The procedure may vary among health care providers and hospitals.  What happens after the procedure?  · Your blood pressure, heart rate, breathing rate, and blood oxygen level will be   monitored often until the medicines you were given have worn off.  · Your fistula site will be checked for bleeding or swelling.  · You will be given pain medication as needed.  · Do not drive for 24 hours if you received a sedative.  This information is not intended to replace advice given to you by your health care provider. Make sure you discuss any questions you have with your health care provider.  Document Released: 09/25/2015 Document Revised: 03/21/2016 Document Reviewed: 01/04/2015  Elsevier Interactive Patient Education © 2017 [employer]

## 2017-04-22 NOTE — Progress Notes (Signed)
Patient ID: Sarah Hamilton, female   DOB: [birth year], 71 y.o.   MRN: [id number]  Chief Complaint  Patient presents with  . New Evaluation    Create fistula    HPI Sarah Hamilton is a 71 y.o. female.  I am asked to see the patient by Dr. Lutricia Feil for evaluation of dialysis access placement for CKD.  The patient reports Urinating a normal amount, but she has what is documented as stage IV chronic kidney disease. GFR at this time. She has multiple family members including his son who have renal failure. He gets his dialysis in [place]. No loss of appetite or itching. Swelling. She fatigues easily, but this has been steadily progressive over years. She has had issues with her kidneys for some time and follows with nephrology for this. She is now being referred for evaluation for fistula placement as it is felt she is likely to need dialysis within several months. She is right-hand dominant. She denies fever or chills.   Past Medical History:  Diagnosis Date  . Anemia   . Anginal pain ([place])   . Arthritis   . CHF (congestive heart failure) ([place])   . CKD (chronic kidney disease), stage IV ([place])   . Coronary artery disease   . Dementia   . Diverticulitis   . Diverticulosis   . Dysphagia   . Dyspnea   . Edema    FEET/LEGS  . GERD (gastroesophageal reflux disease)   . Gout   . Heart murmur   . Hepatitis    B  . Hyperlipidemia   . Hyperparathyroidism ([place])   . Hypertension   . Hypothyroidism   . Orthopnea   . Pain    CHRONIC PAIN SYNDROME  . Palpitations   . Seizures ([place])   . Spinal stenosis   . Tremor    ESSENTIAL  . Wheezing     Past Surgical History:  Procedure Laterality Date  . ABDOMINAL HYSTERECTOMY    . CATARACT EXTRACTION W/PHACO Left 11/07/2016   Procedure: CATARACT EXTRACTION PHACO AND INTRAOCULAR LENS PLACEMENT (IOC);  Surgeon: Eulogio Bear, MD;  Location: [facility] ORS;  Service: Ophthalmology;  Laterality: Left;  Lot # [id number] [place]: 00:38.0 AP%: 10.1 CDE:  3.86  . CATARACT EXTRACTION W/PHACO Right 01/09/2017   Procedure: CATARACT EXTRACTION PHACO AND INTRAOCULAR LENS PLACEMENT (IOC);  Surgeon: Eulogio Bear, MD;  Location: [facility] ORS;  Service: Ophthalmology;  Laterality: Right;  [place] 54.8 AP% 5.4 CDE 2.96 Fluid pack lot # [id number]  . CHEST SURGERY     BENIGN TUMOR  . CHOLECYSTECTOMY    . CORONARY ANGIOPLASTY     STENT  . JOINT REPLACEMENT     TKR  . SHOULDER SURGERY      Family History  Problem Relation Age of Onset  . CAD Mother   . Hypertension Mother   . Stroke Mother   . Kidney failure Mother   . Cancer Father   . Kidney failure Brother   . Kidney failure Son        on hemodialysis in [place] History Social History  Substance Use Topics  . Smoking status: Never Smoker  . Smokeless tobacco: Never Used  . Alcohol use No  No IVDU  Allergies  Allergen Reactions  . Pregabalin     Other reaction(s): sleepy  . Azithromycin   . Erythromycin Hives    Current Outpatient Prescriptions  Medication Sig Dispense Refill  . amLODipine (NORVASC) 5 MG tablet Take  5 mg by mouth daily.    Marland Kitchen aspirin EC 81 MG tablet Take 81 mg by mouth daily.    Marland Kitchen atorvastatin (LIPITOR) 20 MG tablet Take 20 mg by mouth at bedtime.    . camphor-menthol (SARNA) lotion Apply 1 application topically 2 (two) times daily as needed for itching.    . carvedilol (COREG) 25 MG tablet Take 25 mg by mouth 2 (two) times daily.    . diclofenac sodium (VOLTAREN) 1 % GEL Apply 2-4 g topically 4 (four) times daily as needed (pain).     . Difluprednate (DUREZOL OP) Apply 1 drop to eye 2 (two) times daily. After surgery    . febuxostat (ULORIC) 40 MG tablet Take 40 mg by mouth daily.    . furosemide (LASIX) 20 MG tablet Take 1 tablet (20 mg total) by mouth daily. 30 tablet 0  . gabapentin (NEURONTIN) 100 MG capsule Take 1 capsule by mouth 3 (three) times daily.     . isosorbide mononitrate (IMDUR) 60 MG 24 hr tablet Take 60 mg by mouth daily.    Marland Kitchen  levETIRAcetam (KEPPRA) 500 MG tablet Take 500 mg by mouth 2 (two) times daily.    . meclizine (ANTIVERT) 25 MG tablet Take 25 mg by mouth 3 (three) times daily as needed for dizziness.    . naloxone (NARCAN) nasal spray 4 mg/0.1 mL Place 1 spray into the nose.    . nitroGLYCERIN (NITROSTAT) 0.4 MG SL tablet Place 0.4 mg under the tongue every 5 (five) minutes as needed for chest pain.     Marland Kitchen nystatin cream (MYCOSTATIN) Apply 1 application topically 2 (two) times daily.    Marland Kitchen oxyCODONE-acetaminophen (PERCOCET/ROXICET) 5-325 MG tablet Take 2 tablets by mouth 2 (two) times daily as needed for moderate pain.     . promethazine (PHENERGAN) 12.5 MG tablet Take 12.5 mg by mouth every 6 (six) hours as needed for nausea or vomiting.     . senna-docusate (SENOKOT-S) 8.6-50 MG tablet Take 2 tablets by mouth daily as needed for mild constipation.     . sevelamer (RENAGEL) 800 MG tablet Take 800 mg by mouth 3 (three) times daily with meals.    . sodium bicarbonate 325 MG tablet Take 650 mg by mouth 2 (two) times daily.    Marland Kitchen tiZANidine (ZANAFLEX) 2 MG tablet Take 2 mg by mouth every 8 (eight) hours as needed for muscle spasms.    . Vitamin D, Ergocalciferol, (DRISDOL) 50000 units CAPS capsule Take 1 capsule by mouth once a week. On mondays    . pantoprazole (PROTONIX) 40 MG tablet Take 1 tablet (40 mg total) by mouth daily. 30 tablet 0   No current facility-administered medications for this visit.       REVIEW OF SYSTEMS (Negative unless checked)  Constitutional: [] Weight loss  [] Fever  [] Chills Cardiac: [] Chest pain   [] Chest pressure   [] Palpitations   [] Shortness of breath when laying flat   [] Shortness of breath at rest   [x] Shortness of breath with exertion. Vascular:  [] Pain in legs with walking   [] Pain in legs at rest   [] Pain in legs when laying flat   [] Claudication   [] Pain in feet when walking  [] Pain in feet at rest  [] Pain in feet when laying flat   [] History of DVT   [] Phlebitis   [x] Swelling  in legs   [] Varicose veins   [] Non-healing ulcers Pulmonary:   [] Uses home oxygen   [] Productive cough   [] Hemoptysis   []   Wheeze  [] COPD   [] Asthma Neurologic:  [] Dizziness  [] Blackouts   [x] Seizures   [] History of stroke   [] History of TIA  [] Aphasia   [] Temporary blindness   [] Dysphagia   [] Weakness or numbness in arms   [] Weakness or numbness in legs Musculoskeletal:  [x] Arthritis   [] Joint swelling   [] Joint pain   [x] Low back pain Hematologic:  [] Easy bruising  [] Easy bleeding   [] Hypercoagulable state   [] Anemic  [] Hepatitis Gastrointestinal:  [] Blood in stool   [] Vomiting blood  [x] Gastroesophageal reflux/heartburn   [] Abdominal pain Genitourinary:  [x] Chronic kidney disease   [] Difficult urination  [] Frequent urination  [] Burning with urination   [] Hematuria Skin:  [] Rashes   [] Ulcers   [] Wounds Psychological:  [] History of anxiety   []  History of major depression.    Physical Exam BP (!) 184/71 (BP Location: Right Arm)   Pulse 68   Resp 16   Ht 5\' 2"  (1.575 m)   Wt 284 lb (128.8 kg)   BMI 51.94 kg/m  Gen:  WD/WN, NAD. Morbidly obese Head: [place]/AT, No temporalis wasting.  Ear/Nose/Throat: Hearing grossly intact, nares w/o erythema or drainage, oropharynx w/o Erythema/Exudate Eyes: Conjunctiva clear, sclera non-icteric  Neck: trachea midline.  No JVD.  Pulmonary:  Good air movement, no use of accessory muscles, respirations not labored Cardiac: RRR Vascular:  Vessel Right Left  Radial Palpable Palpable                          PT 1+ Palpable Trace Palpable  DP Palpable Palpable   Gastrointestinal: soft, non-tender/non-distended.  Musculoskeletal: M/S 5/5 throughout.  Extremities without ischemic changes.  No deformity or atrophy. Walks with a walker. 2+ LE edema Neurologic: Sensation grossly intact in extremities.  Symmetrical.  Speech is fluent. Motor exam as listed above. Psychiatric: Judgment and insight appear fair Dermatologic: No rashes or ulcers noted.  No  cellulitis or open wounds.     Radiology No results found.  Labs No results found for this or any previous visit (from the past 2160 hour(s)).  Assessment/Plan:  Hyperkalemia lipid control important in reducing the progression of atherosclerotic disease. Continue statin therapy   HTN (hypertension) blood pressure control important in reducing the progression of atherosclerotic disease. On appropriate oral medications.   CAD (coronary artery disease) Will likely need cardiac clearance prior to surgery  Chronic kidney disease (CKD), stage IV (severe) (HCC) Recommend: Patient has chronic kidney disease nearing dialysis dependence. At this time the patient does not have appropriate extremity access for dialysis The patient is right-hand dominant. Vein mapping will be performed to see what access options are available at this time. Patient should have a fistula created if the anatomy is favorable. The anatomy was not favorable for AV fistula creation, I would wait until closer to the initiation of dialysis to place an AV graft. I will see the patient back after her vein mapping. The risks, benefits and alternative therapies were reviewed in detail with the patient.  All questions were answered.  The patient agrees to proceed with surgery.        Leotis Pain 04/22/2017, 12:06 PM   This note was created with Dragon medical transcription system.  Any errors from dictation are unintentional.

## 2017-04-22 NOTE — Assessment & Plan Note (Signed)
Will likely need cardiac clearance prior to surgery

## 2017-04-22 NOTE — Assessment & Plan Note (Signed)
blood pressure control important in reducing the progression of atherosclerotic disease. On appropriate oral medications.  

## 2017-06-20 ENCOUNTER — Encounter (INDEPENDENT_AMBULATORY_CARE_PROVIDER_SITE_OTHER): Payer: Medicare (Managed Care)

## 2017-06-20 ENCOUNTER — Ambulatory Visit (INDEPENDENT_AMBULATORY_CARE_PROVIDER_SITE_OTHER): Payer: Medicare (Managed Care) | Admitting: Vascular Surgery

## 2017-07-08 ENCOUNTER — Encounter (INDEPENDENT_AMBULATORY_CARE_PROVIDER_SITE_OTHER): Payer: Medicare (Managed Care)

## 2017-08-01 ENCOUNTER — Encounter (INDEPENDENT_AMBULATORY_CARE_PROVIDER_SITE_OTHER): Payer: Medicare (Managed Care)

## 2017-08-01 ENCOUNTER — Ambulatory Visit (INDEPENDENT_AMBULATORY_CARE_PROVIDER_SITE_OTHER): Payer: Medicare (Managed Care) | Admitting: Vascular Surgery

## 2017-09-17 ENCOUNTER — Ambulatory Visit (INDEPENDENT_AMBULATORY_CARE_PROVIDER_SITE_OTHER): Payer: Medicare (Managed Care) | Admitting: Vascular Surgery

## 2017-09-17 ENCOUNTER — Ambulatory Visit (INDEPENDENT_AMBULATORY_CARE_PROVIDER_SITE_OTHER): Payer: Medicare (Managed Care)

## 2017-09-17 ENCOUNTER — Encounter (INDEPENDENT_AMBULATORY_CARE_PROVIDER_SITE_OTHER): Payer: Self-pay | Admitting: Vascular Surgery

## 2017-09-17 VITALS — BP 184/80 | HR 78 | Resp 19 | Ht 61.0 in | Wt 288.0 lb

## 2017-09-17 DIAGNOSIS — N184 Chronic kidney disease, stage 4 (severe): Secondary | ICD-10-CM | POA: Diagnosis not present

## 2017-09-17 DIAGNOSIS — E785 Hyperlipidemia, unspecified: Secondary | ICD-10-CM

## 2017-09-17 DIAGNOSIS — I10 Essential (primary) hypertension: Secondary | ICD-10-CM

## 2017-09-17 NOTE — Progress Notes (Signed)
Subjective:    Patient ID: Sarah Hamilton, female    DOB: September 01, 1946, 71 y.o.   MRN: [id number] Chief Complaint  Patient presents with  . Follow-up    Vein mapping   Patient presents to review vascular studies.  The patient was originally seen in June 2018 for evaluation of a dialysis access creation.  During her initial visit and today the patient remains stage IV chronic kidney disease.  The patient is following up with her nephrologist in approximately one month.  She presents today without complaint.  Denies any uremic symptoms or shortness of breath.  The patient underwent a bilateral upper extremity vein mapping which was notable for the creation of a 2 part right basilic vein transposition.  The patient is right-hand dominant however is not interested in undergoing a graft placement to the left upper extremity due to her venous anatomy being too small.  I had a long discussion with the patient and her aide who was present in regard to the need for a two part surgical procedure.  The patient understands the whole process will take approximately 2 months to undergo both procedures.   Review of Systems  Constitutional: Negative.   HENT: Negative.   Eyes: Negative.   Respiratory: Negative.   Cardiovascular: Negative.   Gastrointestinal: Negative.   Endocrine: Negative.   Genitourinary:       Chronic kidney disease  Musculoskeletal: Negative.   Skin: Negative.   Allergic/Immunologic: Negative.   Neurological: Negative.   Hematological: Negative.   Psychiatric/Behavioral: Negative.       Objective:   Physical Exam  Constitutional: She is oriented to person, place, and time. She appears well-developed and well-nourished. No distress.  HENT:  Head: Normocephalic and atraumatic.  Eyes: Conjunctivae are normal. Pupils are equal, round, and reactive to light.  Neck: Normal range of motion.  Cardiovascular: Normal rate, regular rhythm, normal heart sounds and intact distal pulses.    Pulses:      Radial pulses are 2+ on the right side, and 2+ on the left side.  Pulmonary/Chest: Effort normal and breath sounds normal.  Musculoskeletal: Normal range of motion. She exhibits edema (Mild bilateral lower extremity edema noted).  Neurological: She is alert and oriented to person, place, and time.  Skin: Skin is warm and dry. She is not diaphoretic.  Psychiatric: She has a normal mood and affect. Her behavior is normal. Judgment and thought content normal.  Vitals reviewed.  BP (!) 184/80 (BP Location: Right Arm, Patient Position: Sitting)   Pulse 78   Resp 19   Ht 5\' 1"  (1.549 m)   Wt 288 lb (130.6 kg)   BMI 54.42 kg/m   Past Medical History:  Diagnosis Date  . Anemia   . Anginal pain ([place])   . Arthritis   . CHF (congestive heart failure) ([place])   . CKD (chronic kidney disease), stage IV ([place])   . Coronary artery disease   . Dementia   . Diverticulitis   . Diverticulosis   . Dysphagia   . Dyspnea   . Edema    FEET/LEGS  . GERD (gastroesophageal reflux disease)   . Gout   . Heart murmur   . Hepatitis    B  . Hyperlipidemia   . Hyperparathyroidism ([place])   . Hypertension   . Hypothyroidism   . Orthopnea   . Pain    CHRONIC PAIN SYNDROME  . Palpitations   . Seizures ([place])   . Spinal stenosis   .  Tremor    ESSENTIAL  . Wheezing    Social History   Socioeconomic History  . Marital status: Married    Spouse name: Not on file  . Number of children: Not on file  . Years of education: Not on file  . Highest education level: Not on file  Social Needs  . Financial resource strain: Not on file  . Food insecurity - worry: Not on file  . Food insecurity - inability: Not on file  . Transportation needs - medical: Not on file  . Transportation needs - non-medical: Not on file  Occupational History  . Not on file  Tobacco Use  . Smoking status: Never Smoker  . Smokeless tobacco: Never Used  Substance and Sexual Activity  . Alcohol use: No     Alcohol/week: 0.0 oz  . Drug use: No  . Sexual activity: Not on file  Other Topics Concern  . Not on file  Social History Narrative  . Not on file   Past Surgical History:  Procedure Laterality Date  . ABDOMINAL HYSTERECTOMY    . CATARACT EXTRACTION W/PHACO Left 11/07/2016   Procedure: CATARACT EXTRACTION PHACO AND INTRAOCULAR LENS PLACEMENT (IOC);  Surgeon: Eulogio Bear, MD;  Location: [facility] ORS;  Service: Ophthalmology;  Laterality: Left;  Lot # [id number] [place]: 00:38.0 AP%: 10.1 CDE: 3.86  . CATARACT EXTRACTION W/PHACO Right 01/09/2017   Procedure: CATARACT EXTRACTION PHACO AND INTRAOCULAR LENS PLACEMENT (IOC);  Surgeon: Eulogio Bear, MD;  Location: [facility] ORS;  Service: Ophthalmology;  Laterality: Right;  [place] 54.8 AP% 5.4 CDE 2.96 Fluid pack lot # [id number]  . CHEST SURGERY     BENIGN TUMOR  . CHOLECYSTECTOMY    . CORONARY ANGIOPLASTY     STENT  . JOINT REPLACEMENT     TKR  . SHOULDER SURGERY     Family History  Problem Relation Age of Onset  . CAD Mother   . Hypertension Mother   . Stroke Mother   . Kidney failure Mother   . Cancer Father   . Kidney failure Brother   . Kidney failure Son        on hemodialysis in [place]   Allergies  Allergen Reactions  . Pregabalin     Other reaction(s): sleepy  . Azithromycin   . Erythromycin Hives      Assessment & Plan:  Patient presents to review vascular studies.  The patient was originally seen in June 2018 for evaluation of a dialysis access creation.  During her initial visit and today the patient remains stage IV chronic kidney disease.  The patient is following up with her nephrologist in approximately one month.  She presents today without complaint.  Denies any uremic symptoms or shortness of breath.  The patient underwent a bilateral upper extremity vein mapping which was notable for the creation of a 2 part right basilic vein transposition.  The patient is right-hand dominant however is not interested in undergoing  a graft placement to the left upper extremity due to her venous anatomy being too small.  I had a long discussion with the patient and her aide who was present in regard to the need for a two part surgical procedure.  The patient understands the whole process will take approximately 2 months to undergo both procedures.  1. Chronic kidney disease (CKD), stage IV (severe) (HCC) - Stable Vein mapping is amenable for creation of a right brachial basilic AV fistula creation This will be completed in a 2-stage process  The patient understands this will take approximately 2 months to complete The patient is right-hand dominant however is not interested in moving forward with graft placement to the left upper extremity due to the small caliber size of her veins. Being that the patient is currently stable stage IV chronic kidney disease graft placement at this time would not be appropriate. Procedure, risks and benefits explained to the patient All questions answered The patient is willing to proceed  2. Hyperlipidemia, unspecified hyperlipidemia type - Stable Encouraged good control as its slows the progression of atherosclerotic disease  3. Essential hypertension -Stable Encouraged good control as its slows the progression of atherosclerotic disease  Current Outpatient Medications on File Prior to Visit  Medication Sig Dispense Refill  . amLODipine (NORVASC) 5 MG tablet Take 5 mg by mouth daily.    Marland Kitchen aspirin EC 81 MG tablet Take 81 mg by mouth daily.    Marland Kitchen atorvastatin (LIPITOR) 20 MG tablet Take 20 mg by mouth at bedtime.    . camphor-menthol (SARNA) lotion Apply 1 application topically 2 (two) times daily as needed for itching.    . carvedilol (COREG) 25 MG tablet Take 25 mg by mouth 2 (two) times daily.    . diclofenac sodium (VOLTAREN) 1 % GEL Apply 2-4 g topically 4 (four) times daily as needed (pain).     . Difluprednate (DUREZOL OP) Apply 1 drop to eye 2 (two) times daily. After surgery    .  febuxostat (ULORIC) 40 MG tablet Take 40 mg by mouth daily.    . furosemide (LASIX) 20 MG tablet Take 1 tablet (20 mg total) by mouth daily. 30 tablet 0  . gabapentin (NEURONTIN) 100 MG capsule Take 1 capsule by mouth 3 (three) times daily.     . isosorbide mononitrate (IMDUR) 60 MG 24 hr tablet Take 60 mg by mouth daily.    Marland Kitchen levETIRAcetam (KEPPRA) 500 MG tablet Take 500 mg by mouth 2 (two) times daily.    . meclizine (ANTIVERT) 25 MG tablet Take 25 mg by mouth 3 (three) times daily as needed for dizziness.    . naloxone (NARCAN) nasal spray 4 mg/0.1 mL Place 1 spray into the nose.    . nitroGLYCERIN (NITROSTAT) 0.4 MG SL tablet Place 0.4 mg under the tongue every 5 (five) minutes as needed for chest pain.     Marland Kitchen nystatin cream (MYCOSTATIN) Apply 1 application topically 2 (two) times daily.    Marland Kitchen oxyCODONE-acetaminophen (PERCOCET/ROXICET) 5-325 MG tablet Take 2 tablets by mouth 2 (two) times daily as needed for moderate pain.     . pantoprazole (PROTONIX) 40 MG tablet Take 1 tablet (40 mg total) by mouth daily. 30 tablet 0  . promethazine (PHENERGAN) 12.5 MG tablet Take 12.5 mg by mouth every 6 (six) hours as needed for nausea or vomiting.     . senna-docusate (SENOKOT-S) 8.6-50 MG tablet Take 2 tablets by mouth daily as needed for mild constipation.     . sevelamer (RENAGEL) 800 MG tablet Take 800 mg by mouth 3 (three) times daily with meals.    . sodium bicarbonate 325 MG tablet Take 650 mg by mouth 2 (two) times daily.    Marland Kitchen tiZANidine (ZANAFLEX) 2 MG tablet Take 2 mg by mouth every 8 (eight) hours as needed for muscle spasms.    . Vitamin D, Ergocalciferol, (DRISDOL) 50000 units CAPS capsule Take 1 capsule by mouth once a week. On mondays     No current facility-administered medications on file  prior to visit.    There are no Patient Instructions on file for this visit. No Follow-up on file.  KIMBERLY A STEGMAYER, PA-C

## 2017-10-07 ENCOUNTER — Other Ambulatory Visit (INDEPENDENT_AMBULATORY_CARE_PROVIDER_SITE_OTHER): Payer: Self-pay | Admitting: Vascular Surgery

## 2017-10-10 ENCOUNTER — Encounter
Admission: RE | Admit: 2017-10-10 | Discharge: 2017-10-10 | Disposition: A | Discharge status: Home / Self Care | Payer: Medicare (Managed Care) | Source: Ambulatory Visit | Attending: Vascular Surgery | Admitting: Vascular Surgery

## 2017-10-10 ENCOUNTER — Other Ambulatory Visit: Payer: Self-pay

## 2017-10-10 DIAGNOSIS — I509 Heart failure, unspecified: Secondary | ICD-10-CM | POA: Insufficient documentation

## 2017-10-10 DIAGNOSIS — I110 Hypertensive heart disease with heart failure: Secondary | ICD-10-CM | POA: Diagnosis not present

## 2017-10-10 DIAGNOSIS — Z01812 Encounter for preprocedural laboratory examination: Secondary | ICD-10-CM | POA: Insufficient documentation

## 2017-10-10 DIAGNOSIS — N189 Chronic kidney disease, unspecified: Secondary | ICD-10-CM | POA: Diagnosis not present

## 2017-10-10 DIAGNOSIS — I130 Hypertensive heart and chronic kidney disease with heart failure and stage 1 through stage 4 chronic kidney disease, or unspecified chronic kidney disease: Secondary | ICD-10-CM | POA: Diagnosis not present

## 2017-10-10 DIAGNOSIS — Z01810 Encounter for preprocedural cardiovascular examination: Secondary | ICD-10-CM | POA: Diagnosis present

## 2017-10-10 LAB — TYPE AND SCREEN
ABO/RH(D): O POS
Antibody Screen: NEGATIVE

## 2017-10-10 LAB — CBC WITH DIFFERENTIAL/PLATELET
BASOS ABS: 0 10*3/uL (ref 0–0.1)
BASOS PCT: 1 %
EOS PCT: 2 %
Eosinophils Absolute: 0.1 10*3/uL (ref 0–0.7)
HEMATOCRIT: 24.5 % — AB (ref 35.0–47.0)
Hemoglobin: 8 g/dL — ABNORMAL LOW (ref 12.0–16.0)
Lymphocytes Relative: 20 %
Lymphs Abs: 1.2 10*3/uL (ref 1.0–3.6)
MCH: 30.6 pg (ref 26.0–34.0)
MCHC: 32.7 g/dL (ref 32.0–36.0)
MCV: 93.4 fL (ref 80.0–100.0)
MONO ABS: 0.6 10*3/uL (ref 0.2–0.9)
MONOS PCT: 10 %
NEUTROS ABS: 4.2 10*3/uL (ref 1.4–6.5)
Neutrophils Relative %: 67 %
PLATELETS: 182 10*3/uL (ref 150–440)
RBC: 2.62 MIL/uL — ABNORMAL LOW (ref 3.80–5.20)
RDW: 14.5 % (ref 11.5–14.5)
WBC: 6.2 10*3/uL (ref 3.6–11.0)

## 2017-10-10 LAB — BASIC METABOLIC PANEL
Anion gap: 9 (ref 5–15)
BUN: 64 mg/dL — ABNORMAL HIGH (ref 6–20)
CALCIUM: 8.4 mg/dL — AB (ref 8.9–10.3)
CO2: 21 mmol/L — AB (ref 22–32)
CREATININE: 3.87 mg/dL — AB (ref 0.44–1.00)
Chloride: 110 mmol/L (ref 101–111)
GFR calc Af Amer: 12 mL/min — ABNORMAL LOW (ref >60)
GFR, EST NON AFRICAN AMERICAN: 11 mL/min — AB (ref >60)
GLUCOSE: 81 mg/dL (ref 65–99)
Potassium: 4.8 mmol/L (ref 3.5–5.1)
Sodium: 140 mmol/L (ref 135–145)

## 2017-10-10 LAB — APTT: APTT: 30 s (ref 24–36)

## 2017-10-10 LAB — PROTIME-INR
INR: 1.01
Prothrombin Time: 13.2 seconds (ref 11.4–15.2)

## 2017-10-10 NOTE — Patient Instructions (Signed)
Your procedure is scheduled on:  Thursday, October 16, 2017 Report to Same Day Surgery on the 2nd floor in the [facility]. To find out your arrival time, please call [phone] between 1PM - 3PM on: Wednesday, October 15, 2017  REMEMBER: Instructions that are not followed completely may result in serious medical risk, up to and including death; or upon the discretion of your surgeon and anesthesiologist your surgery may need to be rescheduled.  Do not eat food after midnight the night before your procedure.  No gum chewing or hard candies.  You may however, drink CLEAR liquids up to 2 hours before you are scheduled to arrive at the hospital for your procedure.  Do not drink clear liquids within 2 hours of the start of your surgery.  Clear liquids include: - water  - apple juice without pulp - clear gatorade - black coffee or tea (Do NOT add anything to the coffee or tea) Do NOT drink anything that is not on this list.  No Alcohol for 24 hours before or after surgery.  No Smoking including e-cigarettes for 24 hours prior to surgery. No chewable tobacco products for at least 6 hours prior to surgery. No nicotine patches on the day of surgery.  Notify your doctor if there is any change in your medical condition (cold, fever, infection).  Do not wear jewelry, make-up, hairpins, clips or nail polish.  Do not wear lotions, powders, or perfumes. You may wear deodorant.  Do not shave 48 hours prior to surgery.   Contacts and dentures may not be worn into surgery.  Do not bring valuables to the hospital. [facility] is not responsible for any belongings or valuables.   TAKE THESE MEDICATIONS THE MORNING OF SURGERY WITH A SIP OF WATER:  1.  AMLODIPINE 2.  CARVEDILOL 3.  DIVALPROEX (DEPAKOTE) 4.  ISOSORBIDE 5.  PANTOPRAZOLE 6.  OXYCODONE (if needed for pain)  Use CHG wipes as directed on instruction sheet.  Follow recommendations from PCP regarding stopping Aspirin,  (CONTINUE TAKING THE ASPIRIN)  NOW!  Stop Anti-inflammatories such as Advil, Aleve, Ibuprofen, Motrin, Naproxen, Naprosyn, Goodie powder, or aspirin products. (May take Tylenol or Acetaminophen if needed.)  NOW!  Stop ANY OVER THE COUNTER supplements until after surgery. (May continue Vitamin D, Vitamin B, and multivitamin.)  If you are being discharged the day of surgery, you will not be allowed to drive home. You will need someone to drive you home and stay with you that night.   If you are taking public transportation, you will need to have a responsible adult to with you.  Please call the number above if you have any questions about these instructions.

## 2017-10-10 NOTE — Progress Notes (Signed)
Hemoglobin/Hematocrit results of today were brought to the attention of Dr. Andree Elk (anesthesia); ok to proceed with upcoming surgery of AV fistula. All abnormal lab results (BMP and CBC) faxed to Dr. Bunnie Domino office.

## 2017-10-16 ENCOUNTER — Ambulatory Visit: Payer: Medicare (Managed Care) | Admitting: Anesthesiology

## 2017-10-16 ENCOUNTER — Encounter: Payer: Self-pay | Admitting: Anesthesiology

## 2017-10-16 ENCOUNTER — Ambulatory Visit
Admission: RE | Admit: 2017-10-16 | Discharge: 2017-10-16 | Disposition: A | Discharge status: Home / Self Care | Payer: Medicare (Managed Care) | Source: Ambulatory Visit | Attending: Vascular Surgery | Admitting: Vascular Surgery

## 2017-10-16 ENCOUNTER — Encounter
Admission: RE | Disposition: A | Discharge status: Home / Self Care | Payer: Self-pay | Source: Ambulatory Visit | Attending: Vascular Surgery

## 2017-10-16 ENCOUNTER — Other Ambulatory Visit: Payer: Self-pay

## 2017-10-16 DIAGNOSIS — D631 Anemia in chronic kidney disease: Secondary | ICD-10-CM | POA: Insufficient documentation

## 2017-10-16 DIAGNOSIS — Z955 Presence of coronary angioplasty implant and graft: Secondary | ICD-10-CM | POA: Insufficient documentation

## 2017-10-16 DIAGNOSIS — K219 Gastro-esophageal reflux disease without esophagitis: Secondary | ICD-10-CM | POA: Diagnosis not present

## 2017-10-16 DIAGNOSIS — N184 Chronic kidney disease, stage 4 (severe): Secondary | ICD-10-CM | POA: Insufficient documentation

## 2017-10-16 DIAGNOSIS — F0390 Unspecified dementia without behavioral disturbance: Secondary | ICD-10-CM | POA: Insufficient documentation

## 2017-10-16 DIAGNOSIS — I509 Heart failure, unspecified: Secondary | ICD-10-CM | POA: Diagnosis not present

## 2017-10-16 DIAGNOSIS — R569 Unspecified convulsions: Secondary | ICD-10-CM | POA: Insufficient documentation

## 2017-10-16 DIAGNOSIS — Z96659 Presence of unspecified artificial knee joint: Secondary | ICD-10-CM | POA: Diagnosis not present

## 2017-10-16 DIAGNOSIS — Z79899 Other long term (current) drug therapy: Secondary | ICD-10-CM | POA: Diagnosis not present

## 2017-10-16 DIAGNOSIS — Z7982 Long term (current) use of aspirin: Secondary | ICD-10-CM | POA: Insufficient documentation

## 2017-10-16 DIAGNOSIS — I2510 Atherosclerotic heart disease of native coronary artery without angina pectoris: Secondary | ICD-10-CM | POA: Diagnosis not present

## 2017-10-16 DIAGNOSIS — M109 Gout, unspecified: Secondary | ICD-10-CM | POA: Insufficient documentation

## 2017-10-16 DIAGNOSIS — Z841 Family history of disorders of kidney and ureter: Secondary | ICD-10-CM | POA: Diagnosis not present

## 2017-10-16 DIAGNOSIS — N185 Chronic kidney disease, stage 5: Secondary | ICD-10-CM | POA: Diagnosis not present

## 2017-10-16 DIAGNOSIS — I130 Hypertensive heart and chronic kidney disease with heart failure and stage 1 through stage 4 chronic kidney disease, or unspecified chronic kidney disease: Secondary | ICD-10-CM | POA: Diagnosis not present

## 2017-10-16 DIAGNOSIS — E785 Hyperlipidemia, unspecified: Secondary | ICD-10-CM | POA: Insufficient documentation

## 2017-10-16 DIAGNOSIS — Z6841 Body Mass Index (BMI) 40.0 and over, adult: Secondary | ICD-10-CM | POA: Insufficient documentation

## 2017-10-16 HISTORY — PX: ULTRASOUND GUIDANCE FOR VASCULAR ACCESS: SHX6516

## 2017-10-16 HISTORY — PX: AV FISTULA PLACEMENT: SHX1204

## 2017-10-16 LAB — ABO/RH: ABO/RH(D): O POS

## 2017-10-16 SURGERY — ARTERIOVENOUS (AV) FISTULA CREATION
Anesthesia: General | Laterality: Right

## 2017-10-16 MED ORDER — FENTANYL CITRATE (PF) 100 MCG/2ML IJ SOLN
INTRAMUSCULAR | Status: AC
  Filled 2017-10-16: qty 2

## 2017-10-16 MED ORDER — EVICEL 2 ML EX KIT
PACK | CUTANEOUS | Status: AC
  Filled 2017-10-16: qty 1

## 2017-10-16 MED ORDER — PHENYLEPHRINE HCL 10 MG/ML IJ SOLN
INTRAMUSCULAR | Status: DC | PRN
  Administered 2017-10-16 (×4): 200 ug via INTRAVENOUS
  Administered 2017-10-16 (×3): 100 ug via INTRAVENOUS
  Administered 2017-10-16 (×2): 200 ug via INTRAVENOUS
  Administered 2017-10-16 (×2): 100 ug via INTRAVENOUS
  Administered 2017-10-16: 200 ug via INTRAVENOUS

## 2017-10-16 MED ORDER — HEPARIN SODIUM (PORCINE) 1000 UNIT/ML IJ SOLN
INTRAMUSCULAR | Status: DC | PRN
  Administered 2017-10-16: 4000 [IU] via INTRAVENOUS

## 2017-10-16 MED ORDER — MIDAZOLAM HCL 2 MG/2ML IJ SOLN
INTRAMUSCULAR | Status: DC | PRN
  Administered 2017-10-16: 2 mg via INTRAVENOUS

## 2017-10-16 MED ORDER — OXYCODONE-ACETAMINOPHEN 5-325 MG PO TABS: 1.0000 | ORAL_TABLET | Freq: Four times a day (QID) | ORAL | 0 refills | Status: DC | PRN

## 2017-10-16 MED ORDER — GLYCOPYRROLATE 0.2 MG/ML IJ SOLN
INTRAMUSCULAR | Status: DC | PRN
  Administered 2017-10-16: 0.2 mg via INTRAVENOUS

## 2017-10-16 MED ORDER — ONDANSETRON HCL 4 MG/2ML IJ SOLN
INTRAMUSCULAR | Status: DC | PRN
  Administered 2017-10-16: 4 mg via INTRAVENOUS

## 2017-10-16 MED ORDER — LIDOCAINE HCL (CARDIAC) 20 MG/ML IV SOLN
INTRAVENOUS | Status: DC | PRN
  Administered 2017-10-16: 100 mg via INTRAVENOUS

## 2017-10-16 MED ORDER — FENTANYL CITRATE (PF) 100 MCG/2ML IJ SOLN: 25.0000 ug | INTRAMUSCULAR | Status: DC | PRN

## 2017-10-16 MED ORDER — PROPOFOL 10 MG/ML IV BOLUS
INTRAVENOUS | Status: DC | PRN
  Administered 2017-10-16: 50 mg via INTRAVENOUS
  Administered 2017-10-16: 130 mg via INTRAVENOUS
  Administered 2017-10-16: 20 mg via INTRAVENOUS

## 2017-10-16 MED ORDER — DEXAMETHASONE SODIUM PHOSPHATE 10 MG/ML IJ SOLN
INTRAMUSCULAR | Status: DC | PRN
  Administered 2017-10-16: 10 mg via INTRAVENOUS

## 2017-10-16 MED ORDER — VASOPRESSIN 20 UNIT/ML IV SOLN
INTRAVENOUS | Status: DC | PRN
  Administered 2017-10-16: 1 [IU] via INTRAVENOUS

## 2017-10-16 MED ORDER — CEFAZOLIN SODIUM-DEXTROSE 2-4 GM/100ML-% IV SOLN
2.0000 g | INTRAVENOUS | Status: AC
  Administered 2017-10-16: 2 g via INTRAVENOUS

## 2017-10-16 MED ORDER — FENTANYL CITRATE (PF) 100 MCG/2ML IJ SOLN
INTRAMUSCULAR | Status: DC | PRN
  Administered 2017-10-16 (×2): 25 ug via INTRAVENOUS

## 2017-10-16 MED ORDER — VASOPRESSIN 20 UNIT/ML IV SOLN
INTRAVENOUS | Status: AC
  Filled 2017-10-16: qty 1

## 2017-10-16 MED ORDER — EVICEL 2 ML EX KIT
PACK | CUTANEOUS | Status: DC | PRN
  Administered 2017-10-16: 2 mL

## 2017-10-16 MED ORDER — ONDANSETRON HCL 4 MG/2ML IJ SOLN: 4.0000 mg | Freq: Once | INTRAMUSCULAR | Status: DC | PRN

## 2017-10-16 MED ORDER — GLYCOPYRROLATE 0.2 MG/ML IJ SOLN
INTRAMUSCULAR | Status: AC
  Filled 2017-10-16: qty 1

## 2017-10-16 MED ORDER — PHENYLEPHRINE HCL 10 MG/ML IJ SOLN
INTRAMUSCULAR | Status: DC | PRN
  Administered 2017-10-16: 50 ug/min via INTRAVENOUS
  Administered 2017-10-16: 40 ug/min via INTRAVENOUS

## 2017-10-16 MED ORDER — LIDOCAINE HCL (PF) 2 % IJ SOLN
INTRAMUSCULAR | Status: AC
  Filled 2017-10-16: qty 10

## 2017-10-16 MED ORDER — HEPARIN SODIUM (PORCINE) 1000 UNIT/ML IJ SOLN
INTRAMUSCULAR | Status: AC
  Filled 2017-10-16: qty 1

## 2017-10-16 MED ORDER — CHLORHEXIDINE GLUCONATE CLOTH 2 % EX PADS: 6.0000 | MEDICATED_PAD | Freq: Once | CUTANEOUS | Status: DC

## 2017-10-16 MED ORDER — ONDANSETRON HCL 4 MG/2ML IJ SOLN
INTRAMUSCULAR | Status: AC
  Filled 2017-10-16: qty 2

## 2017-10-16 MED ORDER — IPRATROPIUM-ALBUTEROL 0.5-2.5 (3) MG/3ML IN SOLN
RESPIRATORY_TRACT | Status: AC
  Administered 2017-10-16: 3 mL via RESPIRATORY_TRACT
  Filled 2017-10-16: qty 3

## 2017-10-16 MED ORDER — PROPOFOL 10 MG/ML IV BOLUS
INTRAVENOUS | Status: AC
  Filled 2017-10-16: qty 20

## 2017-10-16 MED ORDER — MIDAZOLAM HCL 2 MG/2ML IJ SOLN
INTRAMUSCULAR | Status: AC
  Filled 2017-10-16: qty 2

## 2017-10-16 MED ORDER — IPRATROPIUM-ALBUTEROL 0.5-2.5 (3) MG/3ML IN SOLN
3.0000 mL | Freq: Once | RESPIRATORY_TRACT | Status: AC
  Administered 2017-10-16: 3 mL via RESPIRATORY_TRACT

## 2017-10-16 MED ORDER — SUCCINYLCHOLINE CHLORIDE 20 MG/ML IJ SOLN
INTRAMUSCULAR | Status: DC | PRN
  Administered 2017-10-16: 100 mg via INTRAVENOUS

## 2017-10-16 MED ORDER — DEXAMETHASONE SODIUM PHOSPHATE 10 MG/ML IJ SOLN
INTRAMUSCULAR | Status: AC
  Filled 2017-10-16: qty 1

## 2017-10-16 MED ORDER — SODIUM CHLORIDE 0.9 % IV SOLN
INTRAVENOUS | Status: DC
  Administered 2017-10-16: 12:00:00 via INTRAVENOUS

## 2017-10-16 SURGICAL SUPPLY — 52 items
BAG DECANTER FOR FLEXI CONT (MISCELLANEOUS) ×4 IMPLANT
BLADE SURG SZ11 CARB STEEL (BLADE) ×4 IMPLANT
BOOT SUTURE AID YELLOW STND (SUTURE) ×4 IMPLANT
BRUSH SCRUB EZ  4% CHG (MISCELLANEOUS) ×2
BRUSH SCRUB EZ 4% CHG (MISCELLANEOUS) ×2 IMPLANT
CANISTER SUCT 1200ML W/VALVE (MISCELLANEOUS) ×4 IMPLANT
CHLORAPREP W/TINT 26ML (MISCELLANEOUS) ×4 IMPLANT
CLIP SPRNG 6MM S-JAW DBL (CLIP) ×4
DERMABOND ADVANCED (GAUZE/BANDAGES/DRESSINGS) ×2
DERMABOND ADVANCED .7 DNX12 (GAUZE/BANDAGES/DRESSINGS) ×2 IMPLANT
ELECT CAUTERY BLADE 6.4 (BLADE) ×4 IMPLANT
ELECT REM PT RETURN 9FT ADLT (ELECTROSURGICAL) ×4
ELECTRODE REM PT RTRN 9FT ADLT (ELECTROSURGICAL) ×2 IMPLANT
GEL ULTRASOUND 20GR AQUASONIC (MISCELLANEOUS) IMPLANT
GLOVE BIO SURGEON STRL SZ7 (GLOVE) ×8 IMPLANT
GLOVE INDICATOR 7.5 STRL GRN (GLOVE) ×4 IMPLANT
GOWN STRL REUS W/ TWL LRG LVL3 (GOWN DISPOSABLE) ×2 IMPLANT
GOWN STRL REUS W/ TWL XL LVL3 (GOWN DISPOSABLE) ×4 IMPLANT
GOWN STRL REUS W/TWL LRG LVL3 (GOWN DISPOSABLE) ×2
GOWN STRL REUS W/TWL XL LVL3 (GOWN DISPOSABLE) ×4
HEMOSTAT SURGICEL 2X3 (HEMOSTASIS) ×4 IMPLANT
IV NS 500ML (IV SOLUTION) ×2
IV NS 500ML BAXH (IV SOLUTION) ×2 IMPLANT
KIT RM TURNOVER STRD PROC AR (KITS) ×4 IMPLANT
LABEL OR SOLS (LABEL) ×4 IMPLANT
LOOP RED MAXI  1X406MM (MISCELLANEOUS) ×2
LOOP VESSEL MAXI 1X406 RED (MISCELLANEOUS) ×2 IMPLANT
LOOP VESSEL MINI 0.8X406 BLUE (MISCELLANEOUS) ×2 IMPLANT
LOOPS BLUE MINI 0.8X406MM (MISCELLANEOUS) ×2
MICROPUNCTURE 5FR NT-SST (MISCELLANEOUS) ×4 IMPLANT
NEEDLE FILTER BLUNT 18X 1/2SAF (NEEDLE) ×2
NEEDLE FILTER BLUNT 18X1 1/2 (NEEDLE) ×2 IMPLANT
NEEDLE HYPO 30X.5 LL (NEEDLE) IMPLANT
NS IRRIG 500ML POUR BTL (IV SOLUTION) ×4 IMPLANT
PACK EXTREMITY ARMC (MISCELLANEOUS) ×4 IMPLANT
PAD PREP 24X41 OB/GYN DISP (PERSONAL CARE ITEMS) ×4 IMPLANT
SOLUTION CELL SAVER (CLIP) ×2 IMPLANT
STOCKINETTE STRL 4IN 9604848 (GAUZE/BANDAGES/DRESSINGS) ×4 IMPLANT
SUT MNCRL AB 4-0 PS2 18 (SUTURE) ×4 IMPLANT
SUT PROLENE 6 0 BV (SUTURE) ×16 IMPLANT
SUT SILK 2 0 (SUTURE) ×2
SUT SILK 2-0 18XBRD TIE 12 (SUTURE) ×2 IMPLANT
SUT SILK 3 0 (SUTURE) ×2
SUT SILK 3-0 18XBRD TIE 12 (SUTURE) ×2 IMPLANT
SUT SILK 4 0 (SUTURE) ×2
SUT SILK 4-0 18XBRD TIE 12 (SUTURE) ×2 IMPLANT
SUT VIC AB 3-0 SH 27 (SUTURE) ×4
SUT VIC AB 3-0 SH 27X BRD (SUTURE) ×4 IMPLANT
SYR 20CC LL (SYRINGE) ×4 IMPLANT
SYR 3ML LL SCALE MARK (SYRINGE) ×4 IMPLANT
SYR TB 1ML 27GX1/2 LL (SYRINGE) IMPLANT
TOWEL OR 17X26 4PK STRL BLUE (TOWEL DISPOSABLE) IMPLANT

## 2017-10-16 NOTE — Anesthesia Procedure Notes (Signed)

## 2017-10-16 NOTE — Op Note (Signed)
OPERATIVE NOTE   PROCEDURE: 1. Right brachiobasilic arteriovenous fistula placement  PRE-OPERATIVE DIAGNOSIS: CKD nearing dialysis dependence  POST-OPERATIVE DIAGNOSIS: same as above  SURGEON: Leotis Pain, MD  ASSISTANT(S): Hezzie Bump, PA-C  ANESTHESIA: geenral  ESTIMATED BLOOD LOSS: 10 cc  FINDING(S): 1. Cephalic vein was small and sclerotic and not adequate for fistula creation.  Basilic vein was adequate for fistula  SPECIMEN(S): None  INDICATIONS:   Sarah Hamilton is a 71 y.o. female who presents with advanced chronic kidney disease nearing dialysis dependence.  The patient is scheduled for right side first stage brachiobasilic AVF creation based off of ultrasound findings.  The patient is aware the risks include but are not limited to: bleeding, infection, steal syndrome, nerve damage, ischemic monomelic neuropathy, failure to mature, and need for additional procedures.  The patient is aware of the risks of the procedure and elects to proceed forward.  DESCRIPTION: After full informed written consent was obtained from the patient, the patient was brought back to the operating room and placed supine upon the operating table.  The nursing staff was unable to obtain intravenous access, so I began the procedure by evaluating her left arm with ultrasound.  Using ultrasound guidance, a micropuncture needle was used to access the left brachial vein just above the antecubital fossa.  A micropuncture wire and sheath were then placed in this micropuncture sheath was used for her intravenous access throughout the procedure.  Prior to induction, the patient received IV antibiotics.   After obtaining adequate anesthesia, the patient was then prepped and draped in the standard fashion for a left arm access procedure.  I turned my attention first to identifying the patient's basilic vein and brachial artery.   I made a transverse antecubital incision at the level of the antecubital fossa and  dissected through the subcutaneous tissue and fascia to gain exposure of the brachial artery.  This was noted to be suitable for fistula creation.  This was dissected out proximally and distally and controlled with vessel loops.  I initially evaluated the cephalic vein to see if this would be usable so that we can avoid a second stage operation.  The cephalic vein was dissected out and found to be reasonably small.  I then attempted to pass dilators to see if this would be usable for fistula creation.  Second pass a 2 mm dilator but a 2.5 mm dilator was difficult to pass and a 3 mm dilator would not pass.  With passing of dilators there were actually rents in the cephalic vein that was visible so this was clearly not going to be usable for a fistula.  The right cephalic vein was then ligated.  I then dissected out the right basilic vein.  This was noted to be suitable for fistula creation. The patient was then heparinized.  The vein was marked for orientation and distal segment of the vein was ligated with a  2-0 silk, and the vein was transected.  I then instilled the heparinized saline into the vein and clamped it.  At this point, I reset my exposure of the brachial artery and placed the artery under tension proximally and distally.  I made an arteriotomy with a #11 blade, and then I extended the arteriotomy with a Potts scissor.  I then irrigated the artery with heparinized saline.  The vein was cut and beveled to appropriate length to match the arteriotomy.  The vein was then sewn to the artery in an end-to-side configuration with  a running 6-0 Prolene suture.  Prior to completing this anastomosis, I allowed the vein and artery to backbleed.  There was no evidence of clot from any vessels.  I completed the anastomosis in the usual fashion and then released all vessel loops and clamps.  There was a palpable  thrill in the venous outflow, and there was a palpable radial pulse.  At this point, I irrigated out the  surgical wound and placed Surgicel.  There was no further active bleeding.  The subcutaneous tissue was reapproximated with a running stitch of 3-0 Vicryl.  The skin was then reapproximated with a running subcuticular stitch of 4-0 Monocryl.  The skin was then cleaned, dried, and reinforced with Dermabond.  The patient tolerated this procedure well and was taken to the recovery room in stable condition.  COMPLICATIONS: None  CONDITION: Stable  Leotis Pain  10/16/2017, 1:55 PM    This note was created with Dragon Medical transcription system. Any errors in dictation are purely unintentional.

## 2017-10-16 NOTE — H&P (Signed)
[facility] History & Physical Update  The patient was interviewed and re-examined.  The patient's previous History and Physical has been reviewed and is unchanged.  There is no change in the plan of care. We plan to proceed with the scheduled procedure.  Leotis Pain, MD  10/16/2017, 11:53 AM

## 2017-10-16 NOTE — OR Nursing (Signed)
Dr Marcello Moores made aware that patient did not take any of her am meds this am no new orders this am.

## 2017-10-16 NOTE — Transfer of Care (Signed)
Immediate Anesthesia Transfer of Care Note  Patient: Sarah Hamilton  Procedure(s) Performed: Procedure(s): ARTERIOVENOUS (AV) FISTULA CREATION (BRACHIOCEPHALIC) (Right) ULTRASOUND GUIDANCE FOR MICROPUNCTURE VENOUS ACCESS  Patient Location: PACU  Anesthesia Type:General  Level of Consciousness: sedated  Airway & Oxygen Therapy: Patient Spontanous Breathing and Patient connected to face mask oxygen  Post-op Assessment: Report given to RN and Post -op Vital signs reviewed and stable  Post vital signs: Reviewed and stable  Last Vitals:  Vitals:   10/16/17 1125 10/16/17 1421  BP: (!) 170/74 (!) 141/77  Pulse: 79 84  Resp: 16 16  Temp: 37.1 C 36.6 C  SpO2: 403% 52%    Complications: No apparent anesthesia complications

## 2017-10-16 NOTE — Discharge Instructions (Signed)

## 2017-10-16 NOTE — Anesthesia Preprocedure Evaluation (Addendum)
Anesthesia Evaluation  Patient identified by MRN, date of birth, ID band Patient awake    Reviewed: Allergy & Precautions, NPO status , Patient's Chart, lab work & pertinent test results, reviewed documented beta blocker date and time   Airway Mallampati: III  TM Distance: >3 FB     Dental  (+) Chipped   Pulmonary shortness of breath,           Cardiovascular hypertension, Pt. on medications and Pt. on home beta blockers + angina + CAD, +CHF and + Orthopnea  + Valvular Problems/Murmurs      Neuro/Psych Seizures -,  PSYCHIATRIC DISORDERS    GI/Hepatic GERD  Controlled,(+) Hepatitis -  Endo/Other  Hypothyroidism Morbid obesity  Renal/GU Renal disease     Musculoskeletal  (+) Arthritis ,   Abdominal   Peds  Hematology  (+) anemia ,   Anesthesia Other Findings Gout.Last seizure. Hb 8. On depakote.  Reproductive/Obstetrics                            Anesthesia Physical Anesthesia Plan  ASA: III  Anesthesia Plan: General   Post-op Pain Management:    Induction: Intravenous  PONV Risk Score and Plan:   Airway Management Planned: Oral ETT and LMA  Additional Equipment:   Intra-op Plan:   Post-operative Plan:   Informed Consent: I have reviewed the patients History and Physical, chart, labs and discussed the procedure including the risks, benefits and alternatives for the proposed anesthesia with the patient or authorized representative who has indicated his/her understanding and acceptance.     Plan Discussed with: CRNA  Anesthesia Plan Comments:         Anesthesia Quick Evaluation

## 2017-10-16 NOTE — Anesthesia Post-op Follow-up Note (Signed)
Anesthesia QCDR form completed.        

## 2017-10-17 ENCOUNTER — Encounter: Payer: Self-pay | Admitting: Vascular Surgery

## 2017-10-17 NOTE — Anesthesia Postprocedure Evaluation (Signed)
Anesthesia Post Note  Patient: Kathi Dohn  Procedure(s) Performed: ARTERIOVENOUS (AV) FISTULA CREATION (BRACHIOCEPHALIC) (Right ) ULTRASOUND GUIDANCE FOR MICROPUNCTURE VENOUS ACCESS  Patient location during evaluation: PACU Anesthesia Type: General Level of consciousness: awake and alert Pain management: pain level controlled Vital Signs Assessment: post-procedure vital signs reviewed and stable Respiratory status: spontaneous breathing, nonlabored ventilation, respiratory function stable and patient connected to nasal cannula oxygen Cardiovascular status: blood pressure returned to baseline and stable Postop Assessment: no apparent nausea or vomiting Anesthetic complications: no     Last Vitals:  Vitals:   10/16/17 1527 10/16/17 1555  BP: (!) 156/89 (!) 121/56  Pulse: 84 89  Resp: 16 16  Temp: 36.5 C   SpO2: 93% 95%    Last Pain:  Vitals:   10/16/17 1555  TempSrc:   PainSc: 2                  Precious Haws Kiante Petrovich

## 2017-11-10 ENCOUNTER — Emergency Department: Payer: Medicare (Managed Care)

## 2017-11-10 ENCOUNTER — Encounter: Payer: Self-pay | Admitting: Emergency Medicine

## 2017-11-10 ENCOUNTER — Inpatient Hospital Stay
Admission: EM | Admit: 2017-11-10 | Discharge: 2017-11-18 | DRG: 673 | Disposition: A | Discharge status: Skilled Nursing Facility | Payer: Medicare (Managed Care) | Attending: Internal Medicine | Admitting: Internal Medicine

## 2017-11-10 ENCOUNTER — Other Ambulatory Visit: Payer: Self-pay

## 2017-11-10 DIAGNOSIS — Z961 Presence of intraocular lens: Secondary | ICD-10-CM | POA: Diagnosis present

## 2017-11-10 DIAGNOSIS — N179 Acute kidney failure, unspecified: Secondary | ICD-10-CM | POA: Diagnosis present

## 2017-11-10 DIAGNOSIS — Z955 Presence of coronary angioplasty implant and graft: Secondary | ICD-10-CM

## 2017-11-10 DIAGNOSIS — Z6841 Body Mass Index (BMI) 40.0 and over, adult: Secondary | ICD-10-CM | POA: Diagnosis not present

## 2017-11-10 DIAGNOSIS — N189 Chronic kidney disease, unspecified: Secondary | ICD-10-CM

## 2017-11-10 DIAGNOSIS — G894 Chronic pain syndrome: Secondary | ICD-10-CM | POA: Diagnosis present

## 2017-11-10 DIAGNOSIS — I132 Hypertensive heart and chronic kidney disease with heart failure and with stage 5 chronic kidney disease, or end stage renal disease: Secondary | ICD-10-CM | POA: Diagnosis present

## 2017-11-10 DIAGNOSIS — I5033 Acute on chronic diastolic (congestive) heart failure: Secondary | ICD-10-CM | POA: Diagnosis present

## 2017-11-10 DIAGNOSIS — D631 Anemia in chronic kidney disease: Secondary | ICD-10-CM | POA: Diagnosis present

## 2017-11-10 DIAGNOSIS — N2581 Secondary hyperparathyroidism of renal origin: Secondary | ICD-10-CM | POA: Diagnosis present

## 2017-11-10 DIAGNOSIS — B1910 Unspecified viral hepatitis B without hepatic coma: Secondary | ICD-10-CM | POA: Diagnosis present

## 2017-11-10 DIAGNOSIS — J810 Acute pulmonary edema: Secondary | ICD-10-CM | POA: Diagnosis present

## 2017-11-10 DIAGNOSIS — E039 Hypothyroidism, unspecified: Secondary | ICD-10-CM | POA: Diagnosis present

## 2017-11-10 DIAGNOSIS — R569 Unspecified convulsions: Secondary | ICD-10-CM | POA: Diagnosis not present

## 2017-11-10 DIAGNOSIS — Z9841 Cataract extraction status, right eye: Secondary | ICD-10-CM | POA: Diagnosis not present

## 2017-11-10 DIAGNOSIS — I509 Heart failure, unspecified: Secondary | ICD-10-CM

## 2017-11-10 DIAGNOSIS — Z7982 Long term (current) use of aspirin: Secondary | ICD-10-CM

## 2017-11-10 DIAGNOSIS — N186 End stage renal disease: Secondary | ICD-10-CM

## 2017-11-10 DIAGNOSIS — R0602 Shortness of breath: Secondary | ICD-10-CM | POA: Diagnosis not present

## 2017-11-10 DIAGNOSIS — F0390 Unspecified dementia without behavioral disturbance: Secondary | ICD-10-CM | POA: Diagnosis present

## 2017-11-10 DIAGNOSIS — E875 Hyperkalemia: Secondary | ICD-10-CM | POA: Diagnosis present

## 2017-11-10 DIAGNOSIS — Z79899 Other long term (current) drug therapy: Secondary | ICD-10-CM

## 2017-11-10 DIAGNOSIS — Z90710 Acquired absence of both cervix and uterus: Secondary | ICD-10-CM | POA: Diagnosis not present

## 2017-11-10 DIAGNOSIS — Z9842 Cataract extraction status, left eye: Secondary | ICD-10-CM

## 2017-11-10 DIAGNOSIS — E669 Obesity, unspecified: Secondary | ICD-10-CM | POA: Diagnosis present

## 2017-11-10 DIAGNOSIS — G40909 Epilepsy, unspecified, not intractable, without status epilepticus: Secondary | ICD-10-CM | POA: Diagnosis present

## 2017-11-10 DIAGNOSIS — I2510 Atherosclerotic heart disease of native coronary artery without angina pectoris: Secondary | ICD-10-CM | POA: Diagnosis present

## 2017-11-10 DIAGNOSIS — J9621 Acute and chronic respiratory failure with hypoxia: Secondary | ICD-10-CM | POA: Diagnosis present

## 2017-11-10 DIAGNOSIS — Z96653 Presence of artificial knee joint, bilateral: Secondary | ICD-10-CM | POA: Diagnosis present

## 2017-11-10 DIAGNOSIS — Z9049 Acquired absence of other specified parts of digestive tract: Secondary | ICD-10-CM | POA: Diagnosis not present

## 2017-11-10 DIAGNOSIS — K219 Gastro-esophageal reflux disease without esophagitis: Secondary | ICD-10-CM | POA: Diagnosis present

## 2017-11-10 DIAGNOSIS — E785 Hyperlipidemia, unspecified: Secondary | ICD-10-CM | POA: Diagnosis present

## 2017-11-10 DIAGNOSIS — T82868A Thrombosis of vascular prosthetic devices, implants and grafts, initial encounter: Secondary | ICD-10-CM | POA: Diagnosis not present

## 2017-11-10 DIAGNOSIS — M109 Gout, unspecified: Secondary | ICD-10-CM | POA: Diagnosis present

## 2017-11-10 DIAGNOSIS — N184 Chronic kidney disease, stage 4 (severe): Secondary | ICD-10-CM

## 2017-11-10 DIAGNOSIS — Z96612 Presence of left artificial shoulder joint: Secondary | ICD-10-CM | POA: Diagnosis present

## 2017-11-10 LAB — BASIC METABOLIC PANEL
ANION GAP: 11 (ref 5–15)
BUN: 74 mg/dL — ABNORMAL HIGH (ref 6–20)
CHLORIDE: 109 mmol/L (ref 101–111)
CO2: 23 mmol/L (ref 22–32)
Calcium: 7.9 mg/dL — ABNORMAL LOW (ref 8.9–10.3)
Creatinine, Ser: 4.77 mg/dL — ABNORMAL HIGH (ref 0.44–1.00)
GFR calc Af Amer: 10 mL/min — ABNORMAL LOW (ref >60)
GFR, EST NON AFRICAN AMERICAN: 8 mL/min — AB (ref >60)
Glucose, Bld: 91 mg/dL (ref 65–99)
POTASSIUM: 6.3 mmol/L — AB (ref 3.5–5.1)
SODIUM: 143 mmol/L (ref 135–145)

## 2017-11-10 LAB — CBC WITH DIFFERENTIAL/PLATELET
BASOS ABS: 0 10*3/uL (ref 0–0.1)
Basophils Relative: 1 %
EOS ABS: 0.2 10*3/uL (ref 0–0.7)
EOS PCT: 4 %
HCT: 21.2 % — ABNORMAL LOW (ref 35.0–47.0)
HEMOGLOBIN: 6.8 g/dL — AB (ref 12.0–16.0)
LYMPHS ABS: 1.2 10*3/uL (ref 1.0–3.6)
LYMPHS PCT: 20 %
MCH: 31.7 pg (ref 26.0–34.0)
MCHC: 32.2 g/dL (ref 32.0–36.0)
MCV: 98.6 fL (ref 80.0–100.0)
Monocytes Absolute: 0.7 10*3/uL (ref 0.2–0.9)
Monocytes Relative: 13 %
Neutro Abs: 3.6 10*3/uL (ref 1.4–6.5)
Neutrophils Relative %: 62 %
PLATELETS: 171 10*3/uL (ref 150–440)
RBC: 2.15 MIL/uL — AB (ref 3.80–5.20)
RDW: 16.1 % — ABNORMAL HIGH (ref 11.5–14.5)
WBC: 5.8 10*3/uL (ref 3.6–11.0)

## 2017-11-10 LAB — TROPONIN I

## 2017-11-10 LAB — MRSA PCR SCREENING: MRSA BY PCR: NEGATIVE

## 2017-11-10 MED ORDER — ALBUTEROL SULFATE (2.5 MG/3ML) 0.083% IN NEBU
5.0000 mg | INHALATION_SOLUTION | Freq: Once | RESPIRATORY_TRACT | Status: AC
  Administered 2017-11-10: 5 mg via RESPIRATORY_TRACT
  Filled 2017-11-10: qty 6

## 2017-11-10 MED ORDER — CARVEDILOL 12.5 MG PO TABS
25.0000 mg | ORAL_TABLET | Freq: Two times a day (BID) | ORAL | Status: DC
  Administered 2017-11-10: 25 mg via ORAL
  Filled 2017-11-10: qty 2

## 2017-11-10 MED ORDER — SODIUM CHLORIDE 0.9 % IV SOLN: 10.0000 mL/h | Freq: Once | INTRAVENOUS | Status: DC

## 2017-11-10 MED ORDER — SODIUM CHLORIDE 0.9 % IV SOLN
1.0000 g | Freq: Once | INTRAVENOUS | Status: AC
  Administered 2017-11-10: 1 g via INTRAVENOUS
  Filled 2017-11-10: qty 10

## 2017-11-10 MED ORDER — SODIUM BICARBONATE 8.4 % IV SOLN: 50.0000 meq | Freq: Once | INTRAVENOUS | Status: DC

## 2017-11-10 MED ORDER — OXYCODONE HCL 5 MG PO TABS
5.0000 mg | ORAL_TABLET | Freq: Four times a day (QID) | ORAL | Status: DC | PRN
  Administered 2017-11-10 – 2017-11-11 (×3): 10 mg via ORAL
  Administered 2017-11-13 – 2017-11-14 (×2): 5 mg via ORAL
  Administered 2017-11-16: 10 mg via ORAL
  Administered 2017-11-16: 5 mg via ORAL
  Administered 2017-11-17: 10 mg via ORAL
  Filled 2017-11-10 (×2): qty 1
  Filled 2017-11-10: qty 2
  Filled 2017-11-10: qty 1
  Filled 2017-11-10 (×3): qty 2
  Filled 2017-11-10: qty 1
  Filled 2017-11-10 (×2): qty 2

## 2017-11-10 MED ORDER — DEXTROSE 5 % IV SOLN
1.5000 g | INTRAVENOUS | Status: DC
  Filled 2017-11-10: qty 1.5

## 2017-11-10 MED ORDER — DIVALPROEX SODIUM ER 250 MG PO TB24
250.0000 mg | ORAL_TABLET | Freq: Every day | ORAL | Status: DC
  Administered 2017-11-12: 250 mg via ORAL
  Filled 2017-11-10 (×2): qty 1

## 2017-11-10 MED ORDER — ACETAMINOPHEN 650 MG RE SUPP: 650.0000 mg | Freq: Four times a day (QID) | RECTAL | Status: DC | PRN

## 2017-11-10 MED ORDER — DIVALPROEX SODIUM ER 500 MG PO TB24
500.0000 mg | ORAL_TABLET | Freq: Every morning | ORAL | Status: DC
  Administered 2017-11-12: 500 mg via ORAL
  Filled 2017-11-10 (×2): qty 1

## 2017-11-10 MED ORDER — PANTOPRAZOLE SODIUM 40 MG PO TBEC
40.0000 mg | DELAYED_RELEASE_TABLET | Freq: Every day | ORAL | Status: DC
  Administered 2017-11-12 – 2017-11-18 (×7): 40 mg via ORAL
  Filled 2017-11-10 (×7): qty 1

## 2017-11-10 MED ORDER — DEXTROSE 50 % IV SOLN
50.0000 mL | Freq: Once | INTRAVENOUS | Status: AC
  Administered 2017-11-10: 50 mL via INTRAVENOUS
  Filled 2017-11-10: qty 50

## 2017-11-10 MED ORDER — SENNOSIDES-DOCUSATE SODIUM 8.6-50 MG PO TABS: 2.0000 | ORAL_TABLET | Freq: Every day | ORAL | Status: DC | PRN

## 2017-11-10 MED ORDER — SODIUM CHLORIDE 0.9 % IV SOLN
INTRAVENOUS | Status: DC
  Administered 2017-11-10: 20 mL/h via INTRAVENOUS

## 2017-11-10 MED ORDER — INSULIN ASPART 100 UNIT/ML ~~LOC~~ SOLN
10.0000 [IU] | Freq: Once | SUBCUTANEOUS | Status: AC
  Administered 2017-11-10: 10 [IU] via INTRAVENOUS
  Filled 2017-11-10: qty 1

## 2017-11-10 MED ORDER — SEVELAMER CARBONATE 800 MG PO TABS
800.0000 mg | ORAL_TABLET | Freq: Three times a day (TID) | ORAL | Status: DC
  Administered 2017-11-11 – 2017-11-18 (×16): 800 mg via ORAL
  Filled 2017-11-10 (×17): qty 1

## 2017-11-10 MED ORDER — VITAMIN D 1000 UNITS PO TABS
1000.0000 [IU] | ORAL_TABLET | Freq: Every day | ORAL | Status: DC
  Administered 2017-11-12 – 2017-11-18 (×7): 1000 [IU] via ORAL
  Filled 2017-11-10 (×7): qty 1

## 2017-11-10 MED ORDER — SODIUM CHLORIDE 0.9% FLUSH
10.0000 mL | Freq: Two times a day (BID) | INTRAVENOUS | Status: DC
  Administered 2017-11-10 – 2017-11-18 (×14): 10 mL

## 2017-11-10 MED ORDER — ONDANSETRON HCL 4 MG/2ML IJ SOLN
4.0000 mg | Freq: Four times a day (QID) | INTRAMUSCULAR | Status: DC | PRN
  Administered 2017-11-11: 4 mg via INTRAVENOUS
  Filled 2017-11-10: qty 2

## 2017-11-10 MED ORDER — ATORVASTATIN CALCIUM 20 MG PO TABS
20.0000 mg | ORAL_TABLET | Freq: Every day | ORAL | Status: DC
  Administered 2017-11-11 – 2017-11-18 (×6): 20 mg via ORAL
  Filled 2017-11-10 (×7): qty 1

## 2017-11-10 MED ORDER — ACETAMINOPHEN 325 MG PO TABS
650.0000 mg | ORAL_TABLET | Freq: Four times a day (QID) | ORAL | Status: DC | PRN
  Administered 2017-11-18: 650 mg via ORAL
  Filled 2017-11-10: qty 2

## 2017-11-10 MED ORDER — SODIUM CHLORIDE 0.9% FLUSH: 10.0000 mL | INTRAVENOUS | Status: DC | PRN

## 2017-11-10 MED ORDER — DIVALPROEX SODIUM ER 500 MG PO TB24
1000.0000 mg | ORAL_TABLET | Freq: Every evening | ORAL | Status: DC
  Administered 2017-11-10 – 2017-11-12 (×3): 1000 mg via ORAL
  Filled 2017-11-10 (×3): qty 2

## 2017-11-10 MED ORDER — HEPARIN SODIUM (PORCINE) 5000 UNIT/ML IJ SOLN
5000.0000 [IU] | Freq: Three times a day (TID) | INTRAMUSCULAR | Status: DC
  Administered 2017-11-10 – 2017-11-18 (×22): 5000 [IU] via SUBCUTANEOUS
  Filled 2017-11-10 (×23): qty 1

## 2017-11-10 MED ORDER — NITROGLYCERIN 0.4 MG SL SUBL: 0.4000 mg | SUBLINGUAL_TABLET | SUBLINGUAL | Status: DC | PRN

## 2017-11-10 MED ORDER — SODIUM BICARBONATE 650 MG PO TABS
1300.0000 mg | ORAL_TABLET | Freq: Two times a day (BID) | ORAL | Status: DC
  Administered 2017-11-10 – 2017-11-11 (×2): 1300 mg via ORAL
  Filled 2017-11-10 (×3): qty 2

## 2017-11-10 MED ORDER — DEXTROSE 50 % IV SOLN
25.0000 mL | Freq: Once | INTRAVENOUS | Status: AC
  Administered 2017-11-10 – 2017-11-11 (×2): 25 mL via INTRAVENOUS
  Filled 2017-11-10: qty 50

## 2017-11-10 MED ORDER — ONDANSETRON HCL 4 MG PO TABS: 4.0000 mg | ORAL_TABLET | Freq: Four times a day (QID) | ORAL | Status: DC | PRN

## 2017-11-10 MED ORDER — ISOSORBIDE MONONITRATE ER 30 MG PO TB24
60.0000 mg | ORAL_TABLET | Freq: Every day | ORAL | Status: DC
  Administered 2017-11-12: 60 mg via ORAL
  Filled 2017-11-10: qty 2
  Filled 2017-11-10: qty 1

## 2017-11-10 MED ORDER — AMLODIPINE BESYLATE 5 MG PO TABS: 5.0000 mg | ORAL_TABLET | Freq: Every day | ORAL | Status: DC

## 2017-11-10 MED ORDER — FUROSEMIDE 10 MG/ML IJ SOLN
80.0000 mg | Freq: Two times a day (BID) | INTRAMUSCULAR | Status: DC
  Administered 2017-11-11 – 2017-11-12 (×3): 80 mg via INTRAVENOUS
  Filled 2017-11-10 (×4): qty 8

## 2017-11-10 MED ORDER — FUROSEMIDE 10 MG/ML IJ SOLN
80.0000 mg | Freq: Once | INTRAMUSCULAR | Status: AC
  Administered 2017-11-10: 80 mg via INTRAVENOUS

## 2017-11-10 NOTE — ED Notes (Signed)
Called to give report. CCU unable to take at this time. They will call ED back within 10 min

## 2017-11-10 NOTE — ED Notes (Signed)
Attempted to call Ricardo Jericho, pt daughter back to give update on plan of care. Ms. Wedig can be reached at [phone].

## 2017-11-10 NOTE — ED Notes (Signed)
IV team here to place midline catheter

## 2017-11-10 NOTE — Consult Note (Signed)
Date: 11/10/2017                  Patient Name:  Sarah Hamilton  MRN: [id number]  DOB: October 17, 1946  Age / Sex: 72 y.o., female         PCP: System, Pcp Not In                 Service Requesting Consult: IM/ Gladstone Lighter, MD                 Reason for Consult: ESRD            History of Present Illness: Patient is a 72 y.o. female with medical problems of chronic kidney disease stage IV, hypertension, acidosis, gout, who was admitted to [facility] on 11/10/2017 for evaluation of volume overload and acute shortness of breath.  Patient is closely followed by [facility] nephrology for chronic kidney disease.  She has a right arm brachiocephalic fistula in place. Patient is sent over from [facility] for acute shortness of breath and over 15 pounds of fluid gain over the past week or so.   Patient reports that she has difficulty walking due to the edema and also some difficulty breathing. In the ER, she was found to have a creatinine of 4.77, GFR of 10, critically high potassium of 6.3, BUN of 74 and severe anemia with hemoglobin of 6.8 She is currently being admitted for further evaluation and management and nephrology consult has been requested to evaluate for dialysis    (Not in a hospital admission)  Current medications: Current Facility-Administered Medications  Medication Dose Route Frequency Provider Last Rate Last Dose  . 0.9 %  sodium chloride infusion  10 mL/hr Intravenous Once Alfred Levins, [place], MD      . dextrose 50 % solution 50 mL  50 mL Intravenous Once Alfred Levins, [place], MD      . furosemide (LASIX) injection 80 mg  80 mg Intravenous Once [place], [place], MD      . insulin aspart (novoLOG) injection 10 Units  10 Units Intravenous Once [place], [place], MD      . sodium bicarbonate injection 50 mEq  50 mEq Intravenous Once Rudene Re, MD       Current Outpatient Medications  Medication Sig Dispense Refill  . amLODipine (NORVASC) 5 MG tablet Take 5 mg by mouth  daily.    Marland Kitchen aspirin EC 81 MG tablet Take 81 mg by mouth daily.    Marland Kitchen atorvastatin (LIPITOR) 20 MG tablet Take 20 mg by mouth at bedtime.    . camphor-menthol (SARNA) lotion Apply 1 application topically 2 (two) times daily as needed for itching.    . carvedilol (COREG) 25 MG tablet Take 25 mg by mouth 2 (two) times daily.    . Cholecalciferol (VITAMIN D3) 1000 units CAPS Take 1,000 Units by mouth daily.    . divalproex (DEPAKOTE ER) 250 MG 24 hr tablet Take 250 mg by mouth daily.    . divalproex (DEPAKOTE ER) 500 MG 24 hr tablet Take 500 mg by mouth See admin instructions. Take 500 mg by mouth in the morning and take 1000 mg by mouth in the evening     . febuxostat (ULORIC) 40 MG tablet Take 40 mg by mouth daily.    . furosemide (LASIX) 40 MG tablet Take 80 mg by mouth daily.    . isosorbide mononitrate (IMDUR) 60 MG 24 hr tablet Take 60 mg by mouth daily.    Marland Kitchen oxyCODONE-acetaminophen (PERCOCET/ROXICET) 5-325  MG tablet Take 1-2 tablets by mouth every 6 (six) hours as needed for moderate pain. 30 tablet 0  . pantoprazole (PROTONIX) 40 MG tablet Take 1 tablet (40 mg total) by mouth daily. 30 tablet 0  . sevelamer (RENAGEL) 800 MG tablet Take 800 mg by mouth 3 (three) times daily with meals.    . sodium bicarbonate 650 MG tablet Take 1,300 mg by mouth 2 (two) times daily.     . sodium polystyrene (KAYEXALATE) powder Take 15 g by mouth daily.    Marland Kitchen acetaminophen (TYLENOL) 325 MG tablet Take 325-650 mg by mouth every 8 (eight) hours as needed for moderate pain.    Marland Kitchen diclofenac sodium (VOLTAREN) 1 % GEL Apply 2-4 g topically 4 (four) times daily as needed (pain).     . naloxone (NARCAN) nasal spray 4 mg/0.1 mL Place 1 spray into the nose once.     . nitroGLYCERIN (NITROSTAT) 0.4 MG SL tablet Place 0.4 mg under the tongue every 5 (five) minutes as needed for chest pain.     Marland Kitchen nystatin (NYSTATIN) powder Apply 1 g topically 2 (two) times daily.    . ondansetron (ZOFRAN-ODT) 4 MG disintegrating tablet Take  4 mg by mouth daily as needed for nausea or vomiting.    . promethazine (PHENERGAN) 12.5 MG tablet Take 12.5 mg by mouth every 6 (six) hours as needed for nausea or vomiting.     . senna-docusate (SENOKOT-S) 8.6-50 MG tablet Take 2 tablets by mouth daily as needed for mild constipation.         Allergies: Allergies  Allergen Reactions  . Pregabalin Other (See Comments)    Sleepy  . Azithromycin Other (See Comments)    Unknown  . Cymbalta [Duloxetine Hcl] Other (See Comments)    Per MAR  . Erythromycin Hives      Past Medical History: Past Medical History:  Diagnosis Date  . Anemia   . Anginal pain ([place])   . Arthritis   . CHF (congestive heart failure) ([place])   . CKD (chronic kidney disease), stage IV ([place])   . Coronary artery disease   . Dementia   . Diverticulitis   . Diverticulosis   . Dysphagia   . Dyspnea   . Edema    FEET/LEGS  . GERD (gastroesophageal reflux disease)   . Gout   . Heart murmur   . Hepatitis    B  . Hyperlipidemia   . Hyperparathyroidism ([place])   . Hypertension   . Hypothyroidism   . Orthopnea   . Pain    CHRONIC PAIN SYNDROME  . Palpitations   . Seizures ([place])    last seizure end of November 2018  . Spinal stenosis   . Tremor    ESSENTIAL  . Wheezing      Past Surgical History: Past Surgical History:  Procedure Laterality Date  . ABDOMINAL HYSTERECTOMY    . AV FISTULA PLACEMENT Right 10/16/2017   Procedure: ARTERIOVENOUS (AV) FISTULA CREATION (BRACHIOCEPHALIC);  Surgeon: Algernon Huxley, MD;  Location: [facility] ORS;  Service: Vascular;  Laterality: Right;  . CATARACT EXTRACTION W/PHACO Left 11/07/2016   Procedure: CATARACT EXTRACTION PHACO AND INTRAOCULAR LENS PLACEMENT (IOC);  Surgeon: Eulogio Bear, MD;  Location: [facility] ORS;  Service: Ophthalmology;  Laterality: Left;  Lot # [id number] H [place]: 00:38.0 AP%: 10.1 CDE: 3.86  . CATARACT EXTRACTION W/PHACO Right 01/09/2017   Procedure: CATARACT EXTRACTION PHACO AND INTRAOCULAR LENS PLACEMENT  (IOC);  Surgeon: Eulogio Bear, MD;  Location: [facility]  ORS;  Service: Ophthalmology;  Laterality: Right;  [place] 54.8 AP% 5.4 CDE 2.96 Fluid pack lot # [id number]  . CHEST SURGERY     BENIGN TUMOR  . CHOLECYSTECTOMY    . CORONARY ANGIOPLASTY     STENT  . EYE SURGERY    . JOINT REPLACEMENT Bilateral    TKR  . SHOULDER SURGERY Left    shoulder replacement  . ULTRASOUND GUIDANCE FOR VASCULAR ACCESS  10/16/2017   Procedure: ULTRASOUND GUIDANCE FOR MICROPUNCTURE VENOUS ACCESS;  Surgeon: Algernon Huxley, MD;  Location: [facility] ORS;  Service: Vascular;;     Family History: Family History  Problem Relation Age of Onset  . CAD Mother   . Hypertension Mother   . Stroke Mother   . Kidney failure Mother   . Cancer Father   . Kidney failure Brother   . Kidney failure Son        on hemodialysis in [place] History: Social History   Socioeconomic History  . Marital status: Married    Spouse name: Not on file  . Number of children: Not on file  . Years of education: Not on file  . Highest education level: Not on file  Social Needs  . Financial resource strain: Not on file  . Food insecurity - worry: Not on file  . Food insecurity - inability: Not on file  . Transportation needs - medical: Not on file  . Transportation needs - non-medical: Not on file  Occupational History  . Not on file  Tobacco Use  . Smoking status: Never Smoker  . Smokeless tobacco: Never Used  Substance and Sexual Activity  . Alcohol use: No    Alcohol/week: 0.0 oz  . Drug use: No  . Sexual activity: Not on file  Other Topics Concern  . Not on file  Social History Narrative  . Not on file     Review of Systems: Gen: No fevers or chills, significant weight gain from fluid  HEENT: denies any vision or hearing problems CV: No chest pain or previous cardiac events Resp: + SOB at rest and exertion GI: poor appetite GU : no dysuria, blood in urine MS: no acute c/o Derm:  no c/o Psych:no  c/o Heme: anemia Neuro: no c/o Endocrine. No c.o   Vital Signs: Blood pressure (!) 163/68, pulse 77, temperature 98.2 F (36.8 C), temperature source Oral, height 5\' 2"  (1.575 m), weight 136.1 kg (300 lb), SpO2 97 %.  No intake or output data in the 24 hours ending 11/10/17 1735  Weight trends: Filed Weights   11/10/17 1449  Weight: 136.1 kg (300 lb)    Physical Exam: General:  Mild distress, sitting up in the ER bed   HEENT  moist oral mucous membranes, anicteric  Neck:  Supple  Lungs:  Mild diffuse crackles, 97% on room air  Heart::  Tachycardic  Abdomen:  Soft, nontender  Extremities:   2-3+ pitting edema  Neurologic:  Alert, oriented  Skin:  No acute rashes  Access:  Right upper arm AV fistula          Lab results: Basic Metabolic Panel: Recent Labs  Lab 11/10/17 1542  NA 143  K 6.3*  CL 109  CO2 23  GLUCOSE 91  BUN 74*  CREATININE 4.77*  CALCIUM 7.9*    Liver Function Tests: No results for input(s): AST, ALT, ALKPHOS, BILITOT, PROT, ALBUMIN in the last 168 hours. No results for input(s): LIPASE, AMYLASE in the  last 168 hours. No results for input(s): AMMONIA in the last 168 hours.  CBC: Recent Labs  Lab 11/10/17 1542  WBC 5.8  NEUTROABS 3.6  HGB 6.8*  HCT 21.2*  MCV 98.6  PLT 171    Cardiac Enzymes: Recent Labs  Lab 11/10/17 1542  TROPONINI <0.03    BNP: Invalid input(s): POCBNP  CBG: No results for input(s): GLUCAP in the last 168 hours.  Microbiology: No results found for this or any previous visit (from the past 720 hour(s)).   Coagulation Studies: No results for input(s): LABPROT, INR in the last 72 hours.  Urinalysis: No results for input(s): COLORURINE, LABSPEC, PHURINE, GLUCOSEU, HGBUR, BILIRUBINUR, KETONESUR, PROTEINUR, UROBILINOGEN, NITRITE, LEUKOCYTESUR in the last 72 hours.  Invalid input(s): APPERANCEUR      Imaging: Dg Chest 2 View  Result Date: 11/10/2017 CLINICAL DATA:  15 pound weight gain during the  past week. History of end-stage renal disease. EXAM: CHEST  2 VIEW COMPARISON:  01/14/2016 FINDINGS: Examination is degraded due to patient body habitus. Grossly unchanged enlarged cardiac silhouette and mediastinal contours given persistently reduced lung volumes. Atherosclerotic plaque within the thoracic aorta. Mild pulmonary venous congestion without frank evidence of edema. Bibasilar heterogeneous opacities are unchanged and favored to represent atelectasis. No new focal airspace opacities. No pleural effusion or pneumothorax. No definite acute osseus abnormalities. Accentuated thoracic kyphosis. Post left hemi humeral arthroplasty, incompletely evaluated. IMPRESSION: Similar findings of cardiomegaly and pulmonary venous congestion without definitive superimposed acute cardiopulmonary disease on this hypoventilated, body habitus degraded examination. Specifically, no definite evidence of pulmonary edema. Electronically Signed   By: Sandi Mariscal M.D.   On: 11/10/2017 16:26      Assessment & Plan: Pt is a 72 y.o. African-American  female with severe hypertension, chronic kidney disease, obesity gout, was admitted on 11/10/2017 with acute shortness of breath and volume overload.   1.  Chronic kidney disease stage V with severe hyperkalemia At this time it seems that patient has progressed to end-stage renal disease.  She has right upper arm AV fistula which is not ready to be used yet.  She has volume overload and hyperkalemia.  Her oxygen saturations are 96-97% on room air. -Plan to treat medically tonight with Kayexalate, insulin/D50, IV Lasix to control volume and potassium -Plan for PermCath tomorrow morning to initiate dialysis.  Discussed this with patient.  She has agreed to proceed  2.  Anemia of chronic kidney disease -Blood transfusion is planned -IV Lasix after the blood transfusion - We will obtain iron studies with dialysis  3.   Acute volume overload with pulmonary edema - IV Lasix,  BiPAP as necessary -Oxygen supplementation as necessary -Volume removal with dialysis tomorrow  Will folow

## 2017-11-10 NOTE — ED Notes (Signed)
Attempted to call report, receiving RN and charge RN unable to take report at this time.

## 2017-11-10 NOTE — ED Notes (Signed)
Date and time results received: 11/10/17 1636 (use smartphrase ".now" to insert current time)  Test: potassium Critical Value: 6.3  Name of Provider Notified: Dr. Alfred Levins  Orders Received? Or Actions Taken?: Orders Received - See Orders for details

## 2017-11-10 NOTE — ED Notes (Signed)
Per [facility] CCU charge pt is not going to room 2 and they are unable to take report at this time

## 2017-11-10 NOTE — H&P (Signed)
[place] [facility] [place] NAME: Sarah Hamilton    MR#:  [id number]  DATE OF BIRTH:  1946-05-05  DATE OF ADMISSION:  11/10/2017  PRIMARY CARE PHYSICIAN: System, Pcp Not In   REQUESTING/REFERRING PHYSICIAN: Dr. Rudene Re  CHIEF COMPLAINT:   Chief Complaint  Patient presents with  . Shortness of Breath    HISTORY OF PRESENT ILLNESS:  Sarah Hamilton  is a 72 y.o. female with a known history of hypertension, CK D stage 5, CAD status post stents, GERD, bilateral lower extremity edema, chronic iron deficiency anemia, seizure disorder presents to hospital secondary to worsening shortness of breath. Patient has known history of CKD stage IV that has been progressing. She has been followed by [facility] nephrology and just had a AV fistula placed on 10/16/2017. She continues to make urine. She's been having worsening dyspnea for almost 2 weeks now. It has gotten worse in the last couple of days to the point that she is unable to get around, trouble sleeping at nighttime. Occasional dry cough. Has chronic nausea but denies any vomiting. No chest pain, no fevers or chills. Feels like she has gained almost 15 pounds in the last 2 weeks. Worsening lower extremity edema noted. She denies any active bleeding at this time. Her labs here show potassium of 6.3 and worsening creatinine from 3.8 to 4.7 today. She appears tachypneic but her saturations are fine on room air. Examination reveals significant wheezing.  PAST MEDICAL HISTORY:   Past Medical History:  Diagnosis Date  . Anemia   . Anginal pain ([place])   . Arthritis   . CHF (congestive heart failure) ([place])   . CKD (chronic kidney disease), stage IV ([place])   . Coronary artery disease   . Dementia   . Diverticulitis   . Diverticulosis   . Dysphagia   . Dyspnea   . Edema    FEET/LEGS  . GERD (gastroesophageal reflux disease)   . Gout   . Heart murmur   . Hepatitis    B  . Hyperlipidemia   .  Hyperparathyroidism ([place])   . Hypertension   . Hypothyroidism   . Orthopnea   . Pain    CHRONIC PAIN SYNDROME  . Palpitations   . Seizures ([place])    last seizure end of November 2018  . Spinal stenosis   . Tremor    ESSENTIAL  . Wheezing     PAST SURGICAL HISTORY:   Past Surgical History:  Procedure Laterality Date  . ABDOMINAL HYSTERECTOMY    . AV FISTULA PLACEMENT Right 10/16/2017   Procedure: ARTERIOVENOUS (AV) FISTULA CREATION (BRACHIOCEPHALIC);  Surgeon: Algernon Huxley, MD;  Location: [facility] ORS;  Service: Vascular;  Laterality: Right;  . CATARACT EXTRACTION W/PHACO Left 11/07/2016   Procedure: CATARACT EXTRACTION PHACO AND INTRAOCULAR LENS PLACEMENT (IOC);  Surgeon: Eulogio Bear, MD;  Location: [facility] ORS;  Service: Ophthalmology;  Laterality: Left;  Lot # [id number] H [place]: 00:38.0 AP%: 10.1 CDE: 3.86  . CATARACT EXTRACTION W/PHACO Right 01/09/2017   Procedure: CATARACT EXTRACTION PHACO AND INTRAOCULAR LENS PLACEMENT (IOC);  Surgeon: Eulogio Bear, MD;  Location: [facility] ORS;  Service: Ophthalmology;  Laterality: Right;  [place] 54.8 AP% 5.4 CDE 2.96 Fluid pack lot # [id number]  . CHEST SURGERY     BENIGN TUMOR  . CHOLECYSTECTOMY    . CORONARY ANGIOPLASTY     STENT  . EYE SURGERY    . JOINT REPLACEMENT Bilateral    TKR  .  SHOULDER SURGERY Left    shoulder replacement  . ULTRASOUND GUIDANCE FOR VASCULAR ACCESS  10/16/2017   Procedure: ULTRASOUND GUIDANCE FOR MICROPUNCTURE VENOUS ACCESS;  Surgeon: Algernon Huxley, MD;  Location: [facility] ORS;  Service: Vascular;;    SOCIAL HISTORY:   Social History   Tobacco Use  . Smoking status: Never Smoker  . Smokeless tobacco: Never Used  Substance Use Topics  . Alcohol use: No    Alcohol/week: 0.0 oz    FAMILY HISTORY:   Family History  Problem Relation Age of Onset  . CAD Mother   . Hypertension Mother   . Stroke Mother   . Kidney failure Mother   . Cancer Father   . Kidney failure Brother   . Kidney failure Son        on  hemodialysis in [place]:   Allergies  Allergen Reactions  . Pregabalin Other (See Comments)    Sleepy  . Azithromycin Other (See Comments)    Unknown  . Cymbalta [Duloxetine Hcl] Other (See Comments)    Per MAR  . Erythromycin Hives    REVIEW OF SYSTEMS:   Review of Systems  Constitutional: Positive for malaise/fatigue. Negative for chills, fever and weight loss.  HENT: Negative for ear discharge, ear pain, hearing loss and nosebleeds.   Eyes: Negative for blurred vision, double vision and photophobia.  Respiratory: Positive for cough and shortness of breath. Negative for hemoptysis and wheezing.   Cardiovascular: Positive for orthopnea and leg swelling. Negative for chest pain and palpitations.  Gastrointestinal: Positive for nausea. Negative for abdominal pain, constipation, diarrhea, heartburn, melena and vomiting.  Genitourinary: Negative for dysuria, frequency, hematuria and urgency.  Musculoskeletal: Positive for back pain. Negative for myalgias and neck pain.  Skin: Negative for rash.  Neurological: Negative for dizziness, tremors, sensory change, speech change, focal weakness and headaches.  Endo/Heme/Allergies: Does not bruise/bleed easily.  Psychiatric/Behavioral: Negative for depression.    MEDICATIONS AT HOME:   Prior to Admission medications   Medication Sig Start Date End Date Taking? Authorizing Provider  amLODipine (NORVASC) 5 MG tablet Take 5 mg by mouth daily. 05/12/14  Yes [provider]  aspirin EC 81 MG tablet Take 81 mg by mouth daily.   Yes [provider]  atorvastatin (LIPITOR) 20 MG tablet Take 20 mg by mouth at bedtime. 07/14/14  Yes [provider]  camphor-menthol Timoteo Ace) lotion Apply 1 application topically 2 (two) times daily as needed for itching.   Yes [provider]  carvedilol (COREG) 25 MG tablet Take 25 mg by mouth 2 (two) times daily. 11/08/14  Yes [provider]    Cholecalciferol (VITAMIN D3) 1000 units CAPS Take 1,000 Units by mouth daily.   Yes [provider]  divalproex (DEPAKOTE ER) 250 MG 24 hr tablet Take 250 mg by mouth daily.   Yes [provider]  divalproex (DEPAKOTE ER) 500 MG 24 hr tablet Take 500 mg by mouth See admin instructions. Take 500 mg by mouth in the morning and take 1000 mg by mouth in the evening    Yes [provider]  febuxostat (ULORIC) 40 MG tablet Take 40 mg by mouth daily. 11/16/14  Yes [provider]  furosemide (LASIX) 40 MG tablet Take 80 mg by mouth daily.   Yes [provider]  isosorbide mononitrate (IMDUR) 60 MG 24 hr tablet Take 60 mg by mouth daily.   Yes [provider]  oxyCODONE-acetaminophen (PERCOCET/ROXICET) 5-325 MG tablet Take  1-2 tablets by mouth every 6 (six) hours as needed for moderate pain. 10/16/17  Yes Algernon Huxley, MD  pantoprazole (PROTONIX) 40 MG tablet Take 1 tablet (40 mg total) by mouth daily. 01/06/16 11/10/17 Yes Dustin Flock, MD  sevelamer (RENAGEL) 800 MG tablet Take 800 mg by mouth 3 (three) times daily with meals.   Yes [provider]  sodium bicarbonate 650 MG tablet Take 1,300 mg by mouth 2 (two) times daily.  09/28/15  Yes [provider]  sodium polystyrene (KAYEXALATE) powder Take 15 g by mouth daily.   Yes [provider]  acetaminophen (TYLENOL) 325 MG tablet Take 325-650 mg by mouth every 8 (eight) hours as needed for moderate pain.    [provider]  diclofenac sodium (VOLTAREN) 1 % GEL Apply 2-4 g topically 4 (four) times daily as needed (pain).  08/17/15   [provider]  naloxone [facility]) nasal spray 4 mg/0.1 mL Place 1 spray into the nose once.     [provider]  nitroGLYCERIN (NITROSTAT) 0.4 MG SL tablet Place 0.4 mg under the tongue every 5 (five) minutes as needed for chest pain.  02/15/15   [provider]  nystatin (NYSTATIN) powder Apply 1 g topically 2  (two) times daily.    [provider]  ondansetron (ZOFRAN-ODT) 4 MG disintegrating tablet Take 4 mg by mouth daily as needed for nausea or vomiting.    [provider]  promethazine (PHENERGAN) 12.5 MG tablet Take 12.5 mg by mouth every 6 (six) hours as needed for nausea or vomiting.  11/08/14   [provider]  senna-docusate (SENOKOT-S) 8.6-50 MG tablet Take 2 tablets by mouth daily as needed for mild constipation.  02/27/15   [provider]      VITAL SIGNS:  Blood pressure (!) 163/68, pulse 77, temperature 98.2 F (36.8 C), temperature source Oral, height 5\' 2"  (1.575 m), weight 136.1 kg (300 lb), SpO2 97 %.  PHYSICAL EXAMINATION:   Physical Exam  GENERAL:  72 y.o.-year-old obese patient lying in the bed, tachypneic  EYES: Pupils equal, round, reactive to light and accommodation. No scleral icterus. Extraocular muscles intact.  HEENT: Head atraumatic, normocephalic. Oropharynx and nasopharynx clear.  NECK:  Supple, no jugular venous distention. No thyroid enlargement, no tenderness.  LUNGS: scattered wheezing all over the lung fields, no rhonchi or crepitation. Using accessory muscles to breathe. Shallow respirations CARDIOVASCULAR: S1, S2 normal. No murmurs, rubs, or gallops.  ABDOMEN: Soft, obese, nontender, nondistended. Bowel sounds present. No organomegaly or mass.  EXTREMITIES: 4+ pedal edema both lower extremities No  cyanosis, or clubbing.  NEUROLOGIC: Cranial nerves II through XII are intact. Muscle strength 5/5 in all extremities. Sensation intact. Gait not checked. Global weakness noted. PSYCHIATRIC: The patient is alert and oriented x 3.  SKIN: No obvious rash, lesion, or ulcer.   LABORATORY PANEL:   CBC Recent Labs  Lab 11/10/17 1542  WBC 5.8  HGB 6.8*  HCT 21.2*  PLT 171   ------------------------------------------------------------------------------------------------------------------  Chemistries  Recent Labs  Lab  11/10/17 1542  NA 143  K 6.3*  CL 109  CO2 23  GLUCOSE 91  BUN 74*  CREATININE 4.77*  CALCIUM 7.9*   ------------------------------------------------------------------------------------------------------------------  Cardiac Enzymes Recent Labs  Lab 11/10/17 1542  TROPONINI <0.03   ------------------------------------------------------------------------------------------------------------------  RADIOLOGY:  Dg Chest 2 View  Result Date: 11/10/2017 CLINICAL DATA:  15 pound weight gain during the past week. History of end-stage renal disease. EXAM: CHEST  2 VIEW  COMPARISON:  01/14/2016 FINDINGS: Examination is degraded due to patient body habitus. Grossly unchanged enlarged cardiac silhouette and mediastinal contours given persistently reduced lung volumes. Atherosclerotic plaque within the thoracic aorta. Mild pulmonary venous congestion without frank evidence of edema. Bibasilar heterogeneous opacities are unchanged and favored to represent atelectasis. No new focal airspace opacities. No pleural effusion or pneumothorax. No definite acute osseus abnormalities. Accentuated thoracic kyphosis. Post left hemi humeral arthroplasty, incompletely evaluated. IMPRESSION: Similar findings of cardiomegaly and pulmonary venous congestion without definitive superimposed acute cardiopulmonary disease on this hypoventilated, body habitus degraded examination. Specifically, no definite evidence of pulmonary edema. Electronically Signed   By: Sandi Mariscal M.D.   On: 11/10/2017 16:26    EKG:   Orders placed or performed during the hospital encounter of 11/10/17  . EKG 12-Lead  . EKG 12-Lead  . EKG 12-Lead  . EKG 12-Lead    IMPRESSION AND PLAN:   Tiahna Cure  is a 72 y.o. female with a known history of hypertension, CKD stage 5, CAD status post stents, GERD, bilateral lower extremity edema, chronic iron deficiency anemia, seizure disorder presents to hospital secondary to worsening shortness  of breath.  1. Hyperkalemia- secondary to ARF - needs IV line- calcium gluconate, kayexalate, Flynn and dextrose. -Likely will need dialysis.  2. CK D stage V progressed to end-stage renal disease-AV fistula in place but not matured -Nephrology consult requested. Vascular consult for permacath placement -Hold nephrotoxins. Monitor input and output as patient still voids -9 need to be initiated on dialysis this admission.  3. Acute pulmonary edema-with tachypnea, saturations are stable. -Admit to ICU. Depending upon respiratory distress, can try BiPAP -Discussed with intensivist. IV Lasix twice a day for now until dialysis is initiated.  4. Acute on chronic anemia-known history of anemia of chronic disease. -1 unit packed RBC transfusion over 4 hours tonight once IVs placed. -Anemia panel. Will need to be started on Epogen with dialysis.  5. GERD-continue PPI  6. Seizure disorder-stable. Continue Depakote  7. DVT prophylaxis- SQ heparin    All the records are reviewed and case discussed with ED provider. Management plans discussed with the patient, family and they are in agreement.  CODE STATUS: Full Code  TOTAL CRITICAL CARE TIME SPENT IN TAKING CARE OF THIS PATIENT: 60 minutes.    Gladstone Lighter M.D on 11/10/2017 at 5:44 PM  Between 7am to 6pm - Pager - [phone]  After 6pm go to [email or web address] - password [email or web address] [facility]  Office  [phone]  CC: Primary care physician; System, Pcp Not In

## 2017-11-10 NOTE — ED Triage Notes (Signed)
Pt sent over from [facility] for further eval of 15lb weight gain over the past week. Pt with ESRD and is possibly in fluid overload. NAD noted at time of check in.

## 2017-11-10 NOTE — ED Notes (Signed)
IV team at bedside 

## 2017-11-10 NOTE — ED Provider Notes (Signed)
[facility] Emergency Department Provider Note  ____________________________________________  Time seen: Approximately 4:51 PM  I have reviewed the triage vital signs and the nursing notes.   HISTORY  Chief Complaint Shortness of Breath   HPI Sarah Hamilton is a 72 y.o. female with a history of CHF, chronic kidney disease stage IV, CAD, dementia, hypertension, hyperlipidemia, hyperparathyroidism, hypothyroidism who presents for evaluation of shortness of breath and 18 pound weight pain in the last week. Patient had a right upper extremity fistula placed a month ago to start dialysis but has not started yet. She reports progressively worsening shortness of breath for 2 weeks. 18 pound weight gain in the last week. Severe shortness of breath that is constant and worse with minimal exertion. Patient has been unable to ambulate in the last 24 hours due to severe shortness of breath. She is also complaining of sharp chest pain that is located in the right side constant and nonradiating, nonpleuritic in nature. She has had a cough that is nonproductive, no hemoptysis.  Past Medical History:  Diagnosis Date  . Anemia   . Anginal pain ([place])   . Arthritis   . CHF (congestive heart failure) ([place])   . CKD (chronic kidney disease), stage IV ([place])   . Coronary artery disease   . Dementia   . Diverticulitis   . Diverticulosis   . Dysphagia   . Dyspnea   . Edema    FEET/LEGS  . GERD (gastroesophageal reflux disease)   . Gout   . Heart murmur   . Hepatitis    B  . Hyperlipidemia   . Hyperparathyroidism ([place])   . Hypertension   . Hypothyroidism   . Orthopnea   . Pain    CHRONIC PAIN SYNDROME  . Palpitations   . Seizures ([place])    last seizure end of November 2018  . Spinal stenosis   . Tremor    ESSENTIAL  . Wheezing     Patient Active Problem List   Diagnosis Date Noted  . Chronic kidney disease (CKD), stage IV (severe) ([place]) 04/22/2017  . Syncope  01/04/2016  . Hyperkalemia 01/04/2016  . HTN (hypertension) 01/04/2016  . GERD (gastroesophageal reflux disease) 01/04/2016  . CAD (coronary artery disease) 01/04/2016  . HLD (hyperlipidemia) 01/04/2016    Past Surgical History:  Procedure Laterality Date  . ABDOMINAL HYSTERECTOMY    . AV FISTULA PLACEMENT Right 10/16/2017   Procedure: ARTERIOVENOUS (AV) FISTULA CREATION (BRACHIOCEPHALIC);  Surgeon: Algernon Huxley, MD;  Location: [facility] ORS;  Service: Vascular;  Laterality: Right;  . CATARACT EXTRACTION W/PHACO Left 11/07/2016   Procedure: CATARACT EXTRACTION PHACO AND INTRAOCULAR LENS PLACEMENT (IOC);  Surgeon: Eulogio Bear, MD;  Location: [facility] ORS;  Service: Ophthalmology;  Laterality: Left;  Lot # [id number] H [place]: 00:38.0 AP%: 10.1 CDE: 3.86  . CATARACT EXTRACTION W/PHACO Right 01/09/2017   Procedure: CATARACT EXTRACTION PHACO AND INTRAOCULAR LENS PLACEMENT (IOC);  Surgeon: Eulogio Bear, MD;  Location: [facility] ORS;  Service: Ophthalmology;  Laterality: Right;  [place] 54.8 AP% 5.4 CDE 2.96 Fluid pack lot # [id number]  . CHEST SURGERY     BENIGN TUMOR  . CHOLECYSTECTOMY    . CORONARY ANGIOPLASTY     STENT  . EYE SURGERY    . JOINT REPLACEMENT Bilateral    TKR  . SHOULDER SURGERY Left    shoulder replacement  . ULTRASOUND GUIDANCE FOR VASCULAR ACCESS  10/16/2017   Procedure: ULTRASOUND GUIDANCE FOR MICROPUNCTURE VENOUS ACCESS;  Surgeon: Leotis Pain  S, MD;  Location: [facility] ORS;  Service: Vascular;;    Prior to Admission medications   Medication Sig Start Date End Date Taking? Authorizing Provider  amLODipine (NORVASC) 5 MG tablet Take 5 mg by mouth daily. 05/12/14  Yes [provider]  aspirin EC 81 MG tablet Take 81 mg by mouth daily.   Yes [provider]  atorvastatin (LIPITOR) 20 MG tablet Take 20 mg by mouth at bedtime. 07/14/14  Yes [provider]  camphor-menthol Timoteo Ace) lotion Apply 1 application topically 2 (two) times daily as needed for itching.    Yes [provider]  carvedilol (COREG) 25 MG tablet Take 25 mg by mouth 2 (two) times daily. 11/08/14  Yes [provider]  Cholecalciferol (VITAMIN D3) 1000 units CAPS Take 1,000 Units by mouth daily.   Yes [provider]  divalproex (DEPAKOTE ER) 250 MG 24 hr tablet Take 250 mg by mouth daily.   Yes [provider]  divalproex (DEPAKOTE ER) 500 MG 24 hr tablet Take 500 mg by mouth See admin instructions. Take 500 mg by mouth in the morning and take 1000 mg by mouth in the evening    Yes [provider]  febuxostat (ULORIC) 40 MG tablet Take 40 mg by mouth daily. 11/16/14  Yes [provider]  furosemide (LASIX) 40 MG tablet Take 80 mg by mouth daily.   Yes [provider]  isosorbide mononitrate (IMDUR) 60 MG 24 hr tablet Take 60 mg by mouth daily.   Yes [provider]  oxyCODONE-acetaminophen (PERCOCET/ROXICET) 5-325 MG tablet Take 1-2 tablets by mouth every 6 (six) hours as needed for moderate pain. 10/16/17  Yes Algernon Huxley, MD  pantoprazole (PROTONIX) 40 MG tablet Take 1 tablet (40 mg total) by mouth daily. 01/06/16 11/10/17 Yes Dustin Flock, MD  sevelamer (RENAGEL) 800 MG tablet Take 800 mg by mouth 3 (three) times daily with meals.   Yes [provider]  sodium bicarbonate 650 MG tablet Take 1,300 mg by mouth 2 (two) times daily.  09/28/15  Yes [provider]  sodium polystyrene (KAYEXALATE) powder Take 15 g by mouth daily.   Yes [provider]  acetaminophen (TYLENOL) 325 MG tablet Take 325-650 mg by mouth every 8 (eight) hours as needed for moderate pain.    [provider]  diclofenac sodium (VOLTAREN) 1 % GEL Apply 2-4 g topically 4 (four) times daily as needed (pain).  08/17/15   [provider]  naloxone [facility]) nasal spray 4 mg/0.1 mL Place 1 spray into the nose once.     [provider]  nitroGLYCERIN (NITROSTAT) 0.4 MG SL tablet Place 0.4 mg under the  tongue every 5 (five) minutes as needed for chest pain.  02/15/15   [provider]  nystatin (NYSTATIN) powder Apply 1 g topically 2 (two) times daily.    [provider]  ondansetron (ZOFRAN-ODT) 4 MG disintegrating tablet Take 4 mg by mouth daily as needed for nausea or vomiting.    [provider]  promethazine (PHENERGAN) 12.5 MG tablet Take 12.5 mg by mouth every 6 (six) hours as needed for nausea or vomiting.  11/08/14   [provider]  senna-docusate (SENOKOT-S) 8.6-50 MG tablet Take 2 tablets by mouth daily as needed for mild constipation.  02/27/15   [provider]    Allergies Pregabalin; Azithromycin; Cymbalta [duloxetine hcl]; and Erythromycin  Family History  Problem Relation Age of Onset  . CAD Mother   . Hypertension  Mother   . Stroke Mother   . Kidney failure Mother   . Cancer Father   . Kidney failure Brother   . Kidney failure Son        on hemodialysis in [place] History Social History   Tobacco Use  . Smoking status: Never Smoker  . Smokeless tobacco: Never Used  Substance Use Topics  . Alcohol use: No    Alcohol/week: 0.0 oz  . Drug use: No    Review of Systems  Constitutional: Negative for fever. + weight gain Eyes: Negative for visual changes. ENT: Negative for sore throat. Neck: No neck pain  Cardiovascular:  chest pain. Respiratory: + shortness of breath. Gastrointestinal: Negative for abdominal pain, vomiting or diarrhea. Genitourinary: Negative for dysuria. Musculoskeletal: Negative for back pain. + b/l leg swelling Skin: Negative for rash. Neurological: Negative for headaches, weakness or numbness. Psych: No SI or HI  ____________________________________________   PHYSICAL EXAM:  VITAL SIGNS: ED Triage Vitals  Enc Vitals Group     BP 11/10/17 1448 (!) 163/68     Pulse Rate 11/10/17 1448 77     Resp --      Temp 11/10/17 1448 98.2 F (36.8 C)     Temp Source 11/10/17 1448 Oral      SpO2 11/10/17 1448 97 %     Weight 11/10/17 1449 300 lb (136.1 kg)     Height 11/10/17 1449 5\' 2"  (1.575 m)     Head Circumference --      Peak Flow --      Pain Score --      Pain Loc --      Pain Edu? --      Excl. in [place]? --     Constitutional: Alert and oriented, mild to moderate respiratory distress.  HEENT:      Head: Normocephalic and atraumatic.         Eyes: Conjunctivae are normal. Sclera is non-icteric.       Mouth/Throat: Mucous membranes are moist.       Neck: Supple with no signs of meningismus. Cardiovascular: Regular rate and rhythm. No murmurs, gallops, or rubs. 2+ symmetrical distal pulses are present in all extremities.  Respiratory: Increased work of breathing, normal sats, patient has diffuse coarse rhonchi and wheezes bilaterally  Gastrointestinal: Soft, non tender, and non distended with positive bowel sounds. No rebound or guarding. Musculoskeletal: 3+ pitting edema bilaterally  Neurologic: Normal speech and language. Face is symmetric. Moving all extremities. No gross focal neurologic deficits are appreciated. Skin: Skin is warm, dry and intact. No rash noted. Psychiatric: Mood and affect are normal. Speech and behavior are normal.  ____________________________________________   LABS (all labs ordered are listed, but only abnormal results are displayed)  Labs Reviewed  CBC WITH DIFFERENTIAL/PLATELET - Abnormal; Notable for the following components:      Result Value   RBC 2.15 (*)    Hemoglobin 6.8 (*)    HCT 21.2 (*)    RDW 16.1 (*)    All other components within normal limits  BASIC METABOLIC PANEL - Abnormal; Notable for the following components:   Potassium 6.3 (*)    BUN 74 (*)    Creatinine, Ser 4.77 (*)    Calcium 7.9 (*)    GFR calc non Af Amer 8 (*)    GFR calc Af Amer 10 (*)    All other components within normal limits  TROPONIN I  TYPE AND SCREEN  PREPARE RBC (  CROSSMATCH)    ____________________________________________  EKG  ED ECG REPORT I, Rudene Re, the attending physician, personally viewed and interpreted this ECG.  15:09 - normal sinus rhythm, rate of 75, normal intervals, normal axis, no peaked T waves, no ST elevations or depressions.  16:38 - normal sinus rhythm, rate of 74, normal intervals, normal axis, no peak T waves, unchanged from prior.  ____________________________________________  RADIOLOGY  CXR:  Similar findings of cardiomegaly and pulmonary venous congestion without definitive superimposed acute cardiopulmonary disease on this hypoventilated, body habitus degraded examination. Specifically, no definite evidence of pulmonary edema. ____________________________________________   PROCEDURES  Procedure(s) performed: None Procedures Critical Care performed: yes  CRITICAL CARE Performed by: Rudene Re  ?  Total critical care time: 40 min  Critical care time was exclusive of separately billable procedures and treating other patients.  Critical care was necessary to treat or prevent imminent or life-threatening deterioration.  Critical care was time spent personally by me on the following activities: development of treatment plan with patient and/or surrogate as well as nursing, discussions with consultants, evaluation of patient's response to treatment, examination of patient, obtaining history from patient or surrogate, ordering and performing treatments and interventions, ordering and review of laboratory studies, ordering and review of radiographic studies, pulse oximetry and re-evaluation of patient's condition.  ____________________________________________   INITIAL IMPRESSION / ASSESSMENT AND PLAN / ED COURSE  72 y.o. female with a history of CHF, chronic kidney disease stage IV, CAD, dementia, hypertension, hyperlipidemia, hyperparathyroidism, hypothyroidism who presents for evaluation of shortness of  breath and 18 pound weight pain in the last week. Patient looks volume overloaded on exam with elevated JVD, 3+ bilateral pitting edema, and pulmonary edema on auscultation. EKG with no evidence of ischemia, arrhythmia, or hyperkalemia. Labs consistent with a K of 6.3. Patient will be given insulin, bicarbonate, D50, albuterol. patient continues to make urine and will be given 80 mg of IV Lasix. Labs showing acute on chronic kidney injury and worsening chronic anemia. Type and cross with 2 u pRBCs have been ordered. Discussed with Dr. Candiss Norse, nephrologist on call who will plan for dialysis catheter insertion and to initiate patient on dialysis. Patient will be monitored carefully on telemetry for any arrhythmias. Discussed with the hospitalist for admission.       A patient still makes urine and will be given 80 mg of IV Lasix.s part of my medical decision making, I reviewed the following data within the [place] notes reviewed and incorporated, Labs reviewed , EKG interpreted , Old EKG reviewed, Old chart reviewed, Radiograph reviewed , Discussed with admitting physician , A consult was requested and obtained from this/these consultant(s) Nephrology, Notes from prior ED visits and [place] Controlled Substance Database    Pertinent labs & imaging results that were available during my care of the patient were reviewed by me and considered in my medical decision making (see chart for details).    ____________________________________________   FINAL CLINICAL IMPRESSION(S) / ED DIAGNOSES  Final diagnoses:  Acute on chronic congestive heart failure, unspecified heart failure type (HCC)  Acute renal failure superimposed on chronic kidney disease, unspecified CKD stage, unspecified acute renal failure type (HCC)  Hyperkalemia  Anemia, chronic renal failure, stage 4 (severe) (HCC)      NEW MEDICATIONS STARTED DURING THIS VISIT:  ED Discharge Orders    None       Note:   This document was prepared using Dragon voice recognition software and may include unintentional dictation  errors.    Rudene Re, MD 11/10/17 [phone]

## 2017-11-10 NOTE — ED Notes (Addendum)
IV team unable to obtain IV access. Dr. Tressia Miners and nephrology consult at bedside, both are aware that pt has no IV access at this time.

## 2017-11-10 NOTE — ED Notes (Signed)
Pt daughter Jazae Gandolfi called back and this RN gave her an update on pt plan of care.

## 2017-11-10 NOTE — ED Triage Notes (Signed)
See prior note. Pt with expiratory wheezing, tight sounding. sats 97% on room air. Pt does have hx asthma.

## 2017-11-10 NOTE — ED Notes (Signed)
Midline Cath placed by IV team

## 2017-11-10 NOTE — ED Notes (Signed)
Per Dr. Alfred Levins blood is non-emergent and may be administered on the floor.

## 2017-11-11 ENCOUNTER — Encounter: Payer: Self-pay | Admitting: *Deleted

## 2017-11-11 ENCOUNTER — Inpatient Hospital Stay
Admission: EM | Disposition: A | Discharge status: Skilled Nursing Facility | Payer: Self-pay | Source: Home / Self Care | Attending: Family Medicine

## 2017-11-11 DIAGNOSIS — T82868A Thrombosis of vascular prosthetic devices, implants and grafts, initial encounter: Secondary | ICD-10-CM

## 2017-11-11 DIAGNOSIS — R0602 Shortness of breath: Secondary | ICD-10-CM

## 2017-11-11 DIAGNOSIS — N186 End stage renal disease: Secondary | ICD-10-CM

## 2017-11-11 HISTORY — PX: DIALYSIS/PERMA CATHETER INSERTION: CATH118288

## 2017-11-11 LAB — GLUCOSE, CAPILLARY
GLUCOSE-CAPILLARY: 82 mg/dL (ref 65–99)
GLUCOSE-CAPILLARY: 66 mg/dL (ref 65–99)
GLUCOSE-CAPILLARY: 85 mg/dL (ref 65–99)
GLUCOSE-CAPILLARY: 91 mg/dL (ref 65–99)
Glucose-Capillary: 82 mg/dL (ref 65–99)
Glucose-Capillary: 79 mg/dL (ref 65–99)
Glucose-Capillary: 89 mg/dL (ref 65–99)
Glucose-Capillary: 106 mg/dL — ABNORMAL HIGH (ref 65–99)

## 2017-11-11 LAB — PREPARE RBC (CROSSMATCH)

## 2017-11-11 LAB — BASIC METABOLIC PANEL
Anion gap: 8 (ref 5–15)
BUN: 70 mg/dL — AB (ref 6–20)
CO2: 24 mmol/L (ref 22–32)
Calcium: 7.3 mg/dL — ABNORMAL LOW (ref 8.9–10.3)
Chloride: 110 mmol/L (ref 101–111)
Creatinine, Ser: 4.55 mg/dL — ABNORMAL HIGH (ref 0.44–1.00)
GFR calc Af Amer: 10 mL/min — ABNORMAL LOW (ref >60)
GFR, EST NON AFRICAN AMERICAN: 9 mL/min — AB (ref >60)
Glucose, Bld: 78 mg/dL (ref 65–99)
Potassium: 5.7 mmol/L — ABNORMAL HIGH (ref 3.5–5.1)
Sodium: 142 mmol/L (ref 135–145)

## 2017-11-11 LAB — CBC
HEMATOCRIT: 21.6 % — AB (ref 35.0–47.0)
HEMOGLOBIN: 6.9 g/dL — AB (ref 12.0–16.0)
MCH: 30.7 pg (ref 26.0–34.0)
MCHC: 32 g/dL (ref 32.0–36.0)
MCV: 96 fL (ref 80.0–100.0)
Platelets: 136 10*3/uL — ABNORMAL LOW (ref 150–440)
RBC: 2.25 MIL/uL — ABNORMAL LOW (ref 3.80–5.20)
RDW: 17.2 % — AB (ref 11.5–14.5)
WBC: 5.1 10*3/uL (ref 3.6–11.0)

## 2017-11-11 LAB — TROPONIN I: Troponin I: <0.03 ng/mL (ref <0.03)

## 2017-11-11 LAB — POTASSIUM: Potassium: 5.2 mmol/L — ABNORMAL HIGH (ref 3.5–5.1)

## 2017-11-11 LAB — PROTIME-INR
INR: 1.19
Prothrombin Time: 15 seconds (ref 11.4–15.2)

## 2017-11-11 LAB — HEMOGLOBIN AND HEMATOCRIT, BLOOD
HCT: 23.8 % — ABNORMAL LOW (ref 35.0–47.0)
HEMOGLOBIN: 7.6 g/dL — AB (ref 12.0–16.0)

## 2017-11-11 SURGERY — DIALYSIS/PERMA CATHETER INSERTION
Anesthesia: Moderate Sedation

## 2017-11-11 MED ORDER — HEPARIN SODIUM (PORCINE) 10000 UNIT/ML IJ SOLN
INTRAMUSCULAR | Status: AC
  Filled 2017-11-11: qty 1

## 2017-11-11 MED ORDER — DEXTROSE 5 % IV SOLN
INTRAVENOUS | Status: AC
  Administered 2017-11-11: 09:00:00
  Filled 2017-11-11: qty 1.5

## 2017-11-11 MED ORDER — IPRATROPIUM-ALBUTEROL 0.5-2.5 (3) MG/3ML IN SOLN
3.0000 mL | Freq: Four times a day (QID) | RESPIRATORY_TRACT | Status: DC | PRN
  Administered 2017-11-11 (×2): 3 mL via RESPIRATORY_TRACT
  Filled 2017-11-11 (×2): qty 3

## 2017-11-11 MED ORDER — HEPARIN (PORCINE) IN NACL 2-0.9 UNIT/ML-% IJ SOLN
INTRAMUSCULAR | Status: AC
  Filled 2017-11-11: qty 500

## 2017-11-11 MED ORDER — FENTANYL CITRATE (PF) 100 MCG/2ML IJ SOLN
INTRAMUSCULAR | Status: AC
  Filled 2017-11-11: qty 2

## 2017-11-11 MED ORDER — LIDOCAINE-EPINEPHRINE (PF) 1 %-1:200000 IJ SOLN
INTRAMUSCULAR | Status: AC
  Filled 2017-11-11: qty 30

## 2017-11-11 MED ORDER — CARVEDILOL 12.5 MG PO TABS
12.5000 mg | ORAL_TABLET | Freq: Two times a day (BID) | ORAL | Status: DC
  Administered 2017-11-11: 12.5 mg via ORAL
  Filled 2017-11-11: qty 1

## 2017-11-11 MED ORDER — DEXTROSE 50 % IV SOLN
INTRAVENOUS | Status: AC
  Administered 2017-11-11: 25 mL via INTRAVENOUS
  Filled 2017-11-11: qty 50

## 2017-11-11 MED ORDER — TORSEMIDE 100 MG PO TABS
100.0000 mg | ORAL_TABLET | Freq: Every day | ORAL | Status: DC
  Administered 2017-11-11 – 2017-11-18 (×8): 100 mg via ORAL
  Filled 2017-11-11: qty 5
  Filled 2017-11-11: qty 1
  Filled 2017-11-11: qty 5
  Filled 2017-11-11 (×2): qty 1
  Filled 2017-11-11 (×2): qty 5
  Filled 2017-11-11: qty 1

## 2017-11-11 MED ORDER — IOPAMIDOL (ISOVUE-300) INJECTION 61%
INTRAVENOUS | Status: DC | PRN
  Administered 2017-11-11: 5 mL via INTRAVENOUS

## 2017-11-11 MED ORDER — LIDOCAINE HCL (PF) 1 % IJ SOLN
INTRAMUSCULAR | Status: DC | PRN
  Administered 2017-11-11: 5 mL via INTRADERMAL

## 2017-11-11 MED ORDER — MIDAZOLAM HCL 5 MG/5ML IJ SOLN
INTRAMUSCULAR | Status: AC
  Filled 2017-11-11: qty 5

## 2017-11-11 MED ORDER — TUBERCULIN PPD 5 UNIT/0.1ML ID SOLN
5.0000 [IU] | Freq: Once | INTRADERMAL | Status: AC
  Administered 2017-11-11: 5 [IU] via INTRADERMAL
  Filled 2017-11-11: qty 0.1

## 2017-11-11 SURGICAL SUPPLY — 7 items
GLIDEWIRE STIFF .35X180X3 HYDR (WIRE) ×3 IMPLANT
KIT DIALYSIS CATH TRI 30X13 (CATHETERS) ×6 IMPLANT
NEEDLE ENTRY 21GA 7CM ECHOTIP (NEEDLE) ×3 IMPLANT
PACK ANGIOGRAPHY (CUSTOM PROCEDURE TRAY) ×3 IMPLANT
SET INTRO CAPELLA COAXIAL (SET/KITS/TRAYS/PACK) ×3 IMPLANT
SHEATH BRITE TIP 5FRX11 (SHEATH) IMPLANT
TUBING CONTRAST HIGH PRESS 72 (TUBING) IMPLANT

## 2017-11-11 NOTE — Progress Notes (Signed)
Transferred to 2-A as ordered.  Report called to [facility], Therapist, sports.  Left floor via 2-A bed with all personal belongings in tow.

## 2017-11-11 NOTE — Progress Notes (Signed)
[place] [facility] [place] NAME: Sarah Hamilton    MR#:  [id number]  DATE OF BIRTH:  11-18-1945  SUBJECTIVE:  CHIEF COMPLAINT:   Chief Complaint  Patient presents with  . Shortness of Breath    REVIEW OF SYSTEMS:  CONSTITUTIONAL: No fever, fatigue or weakness.  EYES: No blurred or double vision.  EARS, NOSE, AND THROAT: No tinnitus or ear pain.  RESPIRATORY: No cough, shortness of breath, wheezing or hemoptysis.  CARDIOVASCULAR: No chest pain, orthopnea, edema.  GASTROINTESTINAL: No nausea, vomiting, diarrhea or abdominal pain.  GENITOURINARY: No dysuria, hematuria.  ENDOCRINE: No polyuria, nocturia,  HEMATOLOGY: No anemia, easy bruising or bleeding SKIN: No rash or lesion. MUSCULOSKELETAL: No joint pain or arthritis.   NEUROLOGIC: No tingling, numbness, weakness.  PSYCHIATRY: No anxiety or depression.   ROS  DRUG ALLERGIES:   Allergies  Allergen Reactions  . Pregabalin Other (See Comments)    Sleepy  . Azithromycin Other (See Comments)    Unknown  . Cymbalta [Duloxetine Hcl] Other (See Comments)    Per MAR  . Erythromycin Hives    VITALS:  Blood pressure 111/67, pulse 75, temperature 98.3 F (36.8 C), temperature source Oral, resp. rate 20, height 5\' 2"  (1.575 m), weight (!) 141 kg (310 lb 13.6 oz), SpO2 96 %.  PHYSICAL EXAMINATION:  GENERAL:  72 y.o.-year-old patient lying in the bed with no acute distress.  EYES: Pupils equal, round, reactive to light and accommodation. No scleral icterus. Extraocular muscles intact.  HEENT: Head atraumatic, normocephalic. Oropharynx and nasopharynx clear.  NECK:  Supple, no jugular venous distention. No thyroid enlargement, no tenderness.  LUNGS: Normal breath sounds bilaterally, no wheezing, rales,rhonchi or crepitation. No use of accessory muscles of respiration.  CARDIOVASCULAR: S1, S2 normal. No murmurs, rubs, or gallops.  ABDOMEN: Soft, nontender, nondistended. Bowel sounds present.  No organomegaly or mass.  EXTREMITIES: No pedal edema, cyanosis, or clubbing.  NEUROLOGIC: Cranial nerves II through XII are intact. Muscle strength 5/5 in all extremities. Sensation intact. Gait not checked.  PSYCHIATRIC: The patient is alert and oriented x 3.  SKIN: No obvious rash, lesion, or ulcer.   Physical Exam LABORATORY PANEL:   CBC Recent Labs  Lab 11/11/17 0454 11/11/17 1100  WBC 5.1  --   HGB 6.9* 7.6*  HCT 21.6* 23.8*  PLT 136*  --    ------------------------------------------------------------------------------------------------------------------  Chemistries  Recent Labs  Lab 11/11/17 0454  NA 142  K 5.7*  CL 110  CO2 24  GLUCOSE 78  BUN 70*  CREATININE 4.55*  CALCIUM 7.3*   ------------------------------------------------------------------------------------------------------------------  Cardiac Enzymes Recent Labs  Lab 11/10/17 1542 11/11/17 0454  TROPONINI <0.03 <0.03   ------------------------------------------------------------------------------------------------------------------  RADIOLOGY:  Dg Chest 2 View  Result Date: 11/10/2017 CLINICAL DATA:  15 pound weight gain during the past week. History of end-stage renal disease. EXAM: CHEST  2 VIEW COMPARISON:  01/14/2016 FINDINGS: Examination is degraded due to patient body habitus. Grossly unchanged enlarged cardiac silhouette and mediastinal contours given persistently reduced lung volumes. Atherosclerotic plaque within the thoracic aorta. Mild pulmonary venous congestion without frank evidence of edema. Bibasilar heterogeneous opacities are unchanged and favored to represent atelectasis. No new focal airspace opacities. No pleural effusion or pneumothorax. No definite acute osseus abnormalities. Accentuated thoracic kyphosis. Post left hemi humeral arthroplasty, incompletely evaluated. IMPRESSION: Similar findings of cardiomegaly and pulmonary venous congestion without definitive superimposed  acute cardiopulmonary disease on this hypoventilated, body habitus degraded examination. Specifically, no definite evidence of pulmonary  edema. Electronically Signed   By: Sandi Mariscal M.D.   On: 11/10/2017 16:26    ASSESSMENT AND PLAN:   Sarah Hamilton  is a 72 y.o. female with a known history of hypertension, CKD stage 5, CAD status post stents, GERD, bilateral lower extremity edema, chronic iron deficiency anemia, seizure disorder presents to hospital secondary to worsening shortness of breath.  1. Hyperkalemia- secondary to ARF - needs IV line- calcium gluconate, kayexalate, Flynn and dextrose. -Likely will need dialysis.  2. CK D stage V progressed to end-stage renal disease-AV fistula in place but not matured -Nephrology consult requested. Vascular consult for permacath placement -Hold nephrotoxins. Monitor input and output as patient still voids -9 need to be initiated on dialysis this admission.  3. Acute pulmonary edema-with tachypnea, saturations are stable. -Admit to ICU. Depending upon respiratory distress, can try BiPAP -Discussed with intensivist. IV Lasix twice a day for now until dialysis is initiated.  4. Acute on chronic anemia-known history of anemia of chronic disease. -1 unit packed RBC transfusion over 4 hours tonight once IVs placed. -Anemia panel. Will need to be started on Epogen with dialysis.  5. GERD-continue PPI  6. Seizure disorder-stable. Continue Depakote  7. DVT prophylaxis- SQ heparin        All the records are reviewed and case discussed with Care Management/Social Workerr. Management plans discussed with the patient, family and they are in agreement.  CODE STATUS: full  TOTAL TIME TAKING CARE OF THIS PATIENT: 45 minutes.     POSSIBLE D/C IN 2- 7 DAYS, DEPENDING ON CLINICAL CONDITION.   Avel Peace Seretha Estabrooks M.D on 11/11/2017   Between 7am to 6pm - Pager - [phone]  After 6pm go to [email or web address] - password [email or web address]  [place]  Office  [phone]  CC: Primary care physician; System, Pcp Not In  Note: This dictation was prepared with Dragon dictation along with smaller phrase technology. Any transcriptional errors that result from this process are unintentional.

## 2017-11-11 NOTE — Progress Notes (Signed)
Pre HD  

## 2017-11-11 NOTE — Progress Notes (Signed)
HD started. 

## 2017-11-11 NOTE — Progress Notes (Signed)
Patient completed 1 unit of PRBC at this time. Tolerated well. Lab was unable to draw from patient so midline was used to obtain labs. Midline is very positional for blood return. Patient just received second dose of 80mg  of Lasix. Prior to administration of second dose patient had 900 ml of urinary output. Safety maintained, able to make needs known.

## 2017-11-11 NOTE — Op Note (Signed)
  OPERATIVE NOTE   PROCEDURE: 1. Insertion of temporary dialysis catheter right femoral approach. 2. Contrast injection right iliac veins and IVC 3. Insertion of temporary dialysis catheter over wire same venous access  PRE-OPERATIVE DIAGNOSIS: Acute renal failure  POST-OPERATIVE DIAGNOSIS: Acute renal failure; malposition of temporary catheter; stricture of right iliac vein  SURGEON: Katha Cabal M.D.  ANESTHESIA: 1% lidocaine local infiltration  ESTIMATED BLOOD LOSS: Minimal cc  INDICATIONS:   Sarah Hamilton is a 72 y.o. female who presents with acute renal failure and severe anemia.  She is just now finishing the first unit of blood and a repeat hemoglobin from this morning is still less than 7.  Therefore, tunneled catheter is contraindicated and a temporary catheter will be placed.  DESCRIPTION: After obtaining full informed written consent, the patient was positioned supine. The right groin was prepped and draped in a sterile fashion. Ultrasound was placed in a sterile sleeve. Ultrasound was utilized to identify the right common femoral vein which is noted to be echolucent and compressible indicating patency. Images recorded for the permanent record. Under real-time visualization a Seldinger needle is inserted into the vein and the guidewires advanced without difficulty. Small counterincision was made at the wire insertion site. Dilator is passed over the wire and the temporary dialysis catheter catheter is fed over the wire without difficulty.  The 2 dialysis lumens aspirate and flush easily and are packed with heparin saline.  However, the venous pigtail does not aspirate well.  Given that a triple-lumen dialysis catheter was selected because they are having significant venous access issues I elected to take a x-ray to verify placement.  Catheter secured to the skin of the right thigh with 2-0 silk. A sterile dressing is applied with Biopatch.  Patient was then moved onto the  fluoroscopy table.  Fluoroscopy demonstrated the catheter appeared to be coursing down into the right internal iliac vein.  The groin was then reprepped and redraped the catheter pulled back into the mid right external iliac vein and 20 cc of dilute contrast was hand-injection.  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  A Glidewire was then introduced and the stenosis crossed and the wire advanced up into the inferior vena cava under fluoroscopic guidance.  The initial catheter was then removed completely and a brand-new 30 cm temporary triple-lumen femoral catheter was advanced over the wire and positioned with its tip in the mid IVC.  All 3 lm aspirated and flushed easily.  Catheter was secured to the skin of the thigh with 2-0 nylon Biopatch and sterile dressing were applied.  COMPLICATIONS: Malposition of initial catheter reposition under fluoroscopic guidance I suspect the stricture of the iliac vein guided the initial wire placement down the internal iliac vein.  CONDITION: Unchanged  [name] Office:  [phone] 11/11/2017, 9:28 AM

## 2017-11-11 NOTE — Progress Notes (Signed)
[place], [place] 11/11/17  Subjective:   Patient remains critically ill.  She reports continued shortness of breath this morning A tunneled dialysis catheter could not be placed because of anemia.  Instead, a temporary dialysis catheter was placed in the right groin.  This morning, patient's potassium level is high at 5.7 No nausea or vomiting reported  Objective:  Vital signs in last 24 hours:  Temp:  [97.5 F (36.4 C)-98.2 F (36.8 C)] 97.7 F (36.5 C) (01/15 0820) Pulse Rate:  [67-84] 73 (01/15 0956) Resp:  [16-43] 20 (01/15 0956) BP: (84-163)/(52-82) 121/76 (01/15 0956) SpO2:  [89 %-97 %] 92 % (01/15 0956) FiO2 (%):  [28 %] 28 % (01/15 0404) Weight:  [136.1 kg (300 lb)-141.2 kg (311 lb 4.6 oz)] 141.1 kg (311 lb) (01/15 0820)  Weight change:  Filed Weights   11/10/17 1449 11/10/17 2120 11/11/17 0820  Weight: 136.1 kg (300 lb) (!) 141.2 kg (311 lb 4.6 oz) (!) 141.1 kg (311 lb)    Intake/Output:    Intake/Output Summary (Last 24 hours) at 11/11/2017 1114 Last data filed at 11/11/2017 0945 Gross per 24 hour  Intake 1504.67 ml  Output 900 ml  Net 604.67 ml     Physical Exam: General:  Chronically ill-appearing, mild distress  HEENT  moist oral mucous membranes  Neck  supple  Pulm/lungs  diffuse bilateral crackles   CVS/Heart regular, no rub  Abdomen:   Soft, nontender   Extremities: 2+ pitting edema bilaterally  Neurologic:  Alert, able to follow commands  Skin:  Dry skin  Access:  Right arm developing brachiocephalic fistula, right femoral temporary dialysis catheter       Basic Metabolic Panel:  Recent Labs  Lab 11/10/17 1542 11/11/17 0235 11/11/17 0454  NA 143  --  142  K 6.3* 5.2* 5.7*  CL 109  --  110  CO2 23  --  24  GLUCOSE 91  --  78  BUN 74*  --  70*  CREATININE 4.77*  --  4.55*  CALCIUM 7.9*  --  7.3*     CBC: Recent Labs  Lab 11/10/17 1542 11/11/17 0454  WBC 5.8 5.1  NEUTROABS 3.6  --   HGB 6.8*  6.9*  HCT 21.2* 21.6*  MCV 98.6 96.0  PLT 171 136*     No results found for: HEPBSAG, HEPBSAB, HEPBIGM    Microbiology:  Recent Results (from the past 240 hour(s))  MRSA PCR Screening     Status: None   Collection Time: 11/10/17  9:41 PM  Result Value Ref Range Status   MRSA by PCR NEGATIVE NEGATIVE Final    Comment:        The GeneXpert MRSA Assay (FDA approved for NASAL specimens only), is one component of a comprehensive MRSA colonization surveillance program. It is not intended to diagnose MRSA infection nor to guide or monitor treatment for MRSA infections. Performed at [facility], [address] [place], [place] [address]     Coagulation Studies: Recent Labs    11/11/17 0454  LABPROT 15.0  INR 1.19    Urinalysis: No results for input(s): COLORURINE, LABSPEC, PHURINE, GLUCOSEU, HGBUR, BILIRUBINUR, KETONESUR, PROTEINUR, UROBILINOGEN, NITRITE, LEUKOCYTESUR in the last 72 hours.  Invalid input(s): APPERANCEUR    Imaging: Dg Chest 2 View  Result Date: 11/10/2017 CLINICAL DATA:  15 pound weight gain during the past week. History of end-stage renal disease. EXAM: CHEST  2 VIEW COMPARISON:  01/14/2016 FINDINGS: Examination is degraded due  to patient body habitus. Grossly unchanged enlarged cardiac silhouette and mediastinal contours given persistently reduced lung volumes. Atherosclerotic plaque within the thoracic aorta. Mild pulmonary venous congestion without frank evidence of edema. Bibasilar heterogeneous opacities are unchanged and favored to represent atelectasis. No new focal airspace opacities. No pleural effusion or pneumothorax. No definite acute osseus abnormalities. Accentuated thoracic kyphosis. Post left hemi humeral arthroplasty, incompletely evaluated. IMPRESSION: Similar findings of cardiomegaly and pulmonary venous congestion without definitive superimposed acute cardiopulmonary disease on this hypoventilated, body habitus degraded  examination. Specifically, no definite evidence of pulmonary edema. Electronically Signed   By: Sandi Mariscal M.D.   On: 11/10/2017 16:26     Medications:   . sodium chloride Stopped (11/10/17 1844)   . amLODipine  5 mg Oral Daily  . atorvastatin  20 mg Oral QHS  . carvedilol  12.5 mg Oral BID  . cholecalciferol  1,000 Units Oral Daily  . divalproex  1,000 mg Oral QPM  . divalproex  250 mg Oral Daily  . divalproex  500 mg Oral q morning - 10a  . furosemide  80 mg Intravenous Q12H  . heparin  5,000 Units Subcutaneous Q8H  . isosorbide mononitrate  60 mg Oral Daily  . pantoprazole  40 mg Oral Daily  . sevelamer carbonate  800 mg Oral TID WC  . sodium bicarbonate  50 mEq Intravenous Once  . sodium bicarbonate  1,300 mg Oral BID  . sodium chloride flush  10-40 mL Intracatheter Q12H   acetaminophen **OR** acetaminophen, ipratropium-albuterol, nitroGLYCERIN, [DISCONTINUED] ondansetron **OR** ondansetron (ZOFRAN) IV, oxyCODONE, senna-docusate, sodium chloride flush  Assessment/ Plan:  72 y.o. African-American female with severe hypertension, chronic kidney disease, obesity gout, was admitted on 11/10/2017 with acute shortness of breath and volume overload.   1.  Chronic kidney disease stage Vwith severe hyperkalemia now progressed to ESRD   2.  Acute pulmonary edema 3.  Anemia of chronic kidney disease 4.  Lower extremity edema  Plan to initiate dialysis as patient appears to have progressed to end-stage renal disease She has volume overload and hyperkalemia therefore she will be dialyzed urgently this morning.  She has a right femoral temporary dialysis catheter.  We plan to remove about 2 kg as tolerated.  She will get a series of dialysis treatments over the next few days.  We plan to place PermCath later this week as her AV fistula is not mature for use yet.  We will also start outpatient discharge planning. Place PPD Hepatitis studies Case d/w Dr Ovid Curd from [facility]     LOS:  [place] 1/15/201911:14 AM  [facility] [place], [place]

## 2017-11-11 NOTE — Care Management (Addendum)
RNCM attempted to reach out to case manager with [facility] [phone] for transition of care coordination; message left. SW with [facility] called me back with this information: Lives with son (has disability); he cooks/assists her. Helps out of hospital bed. Son also receives hemodialysis. Sister lives 2 blocks away and helps.  Aide goes out to her home M, W, F, S, Sun 10-12A, 6-8P; TTh 7-9A; 6-8P. Walks with walker.

## 2017-11-11 NOTE — Progress Notes (Signed)
HD complete 

## 2017-11-11 NOTE — Consult Note (Signed)
Name: Sarah Hamilton MRN: [id number] DOB: November 30, 1945    ADMISSION DATE:  11/10/2017 CONSULTATION DATE: 11/10/2016  REFERRING MD : Dr. Tressia Miners   CHIEF COMPLAINT: Shortness of Breath   BRIEF PATIENT DESCRIPTION:  72 yo female admitted with acute respiratory failure secondary to pulmonary edema, CKD stage V with progression to ESRD with hyperkalemia, and acute on chronic anemia   SIGNIFICANT EVENTS  01/14-Pt admitted to stepdown unit   STUDIES:  None   HISTORY OF PRESENT ILLNESS:   This is a 72 yo female with a PMH of Spinal Stenosis, Seizures, Orthopnea, Hypothyroidism, HTN, Hyperparathyroidism, Hepatitis B, GERD, Dysphagia, Dementia, Diverticulosis, CAD, CKD Stage V, CHF, Anemia, and Kyphosis.  She presented to [facility] ER from [facility] 01/14 with worsening shortness of breath and 15 lb weight gain over the past week.  She has been followed by [facility] nephrology due to Stage IV CKD that has progressively worsened and had an AV fistula placed on 10/16/2017.  She does continue to make urine.  In the ER lab results revealed creatinine 4.77, BUN 74, K+ 6.3, and hgb 6.8.  Therefore, nephrology consulted with plans to treat medically overnight and permcath placement scheduled for 11/11/17 with plans for initiation of hemodialysis on 11/11/17.  She was subsequently admitted to the stepdown unit by hospitalist team for further workup and treatment.    PAST MEDICAL HISTORY :   has a past medical history of Anemia, Anginal pain ([place]), Arthritis, CHF (congestive heart failure) ([place]), CKD (chronic kidney disease), stage IV ([place]), Coronary artery disease, Dementia, Diverticulitis, Diverticulosis, Dysphagia, Dyspnea, Edema, GERD (gastroesophageal reflux disease), Gout, Heart murmur, Hepatitis, Hyperlipidemia, Hyperparathyroidism ([place]), Hypertension, Hypothyroidism, Orthopnea, Pain, Palpitations, Seizures ([place]), Spinal stenosis, Tremor, and Wheezing.  has a past surgical history that includes  Cholecystectomy; Abdominal hysterectomy; Shoulder surgery (Left); Coronary angioplasty; Chest surgery; Cataract extraction w/PHACO (Left, 11/07/2016); Cataract extraction w/PHACO (Right, 01/09/2017); Joint replacement (Bilateral); Eye surgery; AV fistula placement (Right, 10/16/2017); and Ultrasound guidance for vascular access (10/16/2017). Prior to Admission medications   Medication Sig Start Date End Date Taking? Authorizing Provider  amLODipine (NORVASC) 5 MG tablet Take 5 mg by mouth daily. 05/12/14  Yes [provider]  aspirin EC 81 MG tablet Take 81 mg by mouth daily.   Yes [provider]  atorvastatin (LIPITOR) 20 MG tablet Take 20 mg by mouth at bedtime. 07/14/14  Yes [provider]  camphor-menthol Timoteo Ace) lotion Apply 1 application topically 2 (two) times daily as needed for itching.   Yes [provider]  carvedilol (COREG) 25 MG tablet Take 25 mg by mouth 2 (two) times daily. 11/08/14  Yes [provider]  Cholecalciferol (VITAMIN D3) 1000 units CAPS Take 1,000 Units by mouth daily.   Yes [provider]  divalproex (DEPAKOTE ER) 250 MG 24 hr tablet Take 250 mg by mouth daily.   Yes [provider]  divalproex (DEPAKOTE ER) 500 MG 24 hr tablet Take 500 mg by mouth See admin instructions. Take 500 mg by mouth in the morning and take 1000 mg by mouth in the evening    Yes [provider]  febuxostat (ULORIC) 40 MG tablet Take 40 mg by mouth daily. 11/16/14  Yes [provider]  furosemide (LASIX) 40 MG tablet Take 80 mg by mouth daily.   Yes [provider]  isosorbide mononitrate (IMDUR) 60 MG 24 hr tablet Take 60 mg by mouth daily.   Yes [provider]  oxyCODONE-acetaminophen (PERCOCET/ROXICET) 5-325 MG tablet Take 1-2 tablets  by mouth every 6 (six) hours as needed for moderate pain. 10/16/17  Yes Algernon Huxley, MD  pantoprazole (PROTONIX) 40 MG tablet Take 1 tablet (40 mg total) by mouth  daily. 01/06/16 11/10/17 Yes Dustin Flock, MD  sevelamer (RENAGEL) 800 MG tablet Take 800 mg by mouth 3 (three) times daily with meals.   Yes [provider]  sodium bicarbonate 650 MG tablet Take 1,300 mg by mouth 2 (two) times daily.  09/28/15  Yes [provider]  sodium polystyrene (KAYEXALATE) powder Take 15 g by mouth daily.   Yes [provider]  acetaminophen (TYLENOL) 325 MG tablet Take 325-650 mg by mouth every 8 (eight) hours as needed for moderate pain.    [provider]  diclofenac sodium (VOLTAREN) 1 % GEL Apply 2-4 g topically 4 (four) times daily as needed (pain).  08/17/15   [provider]  naloxone [facility]) nasal spray 4 mg/0.1 mL Place 1 spray into the nose once.     [provider]  nitroGLYCERIN (NITROSTAT) 0.4 MG SL tablet Place 0.4 mg under the tongue every 5 (five) minutes as needed for chest pain.  02/15/15   [provider]  nystatin (NYSTATIN) powder Apply 1 g topically 2 (two) times daily.    [provider]  ondansetron (ZOFRAN-ODT) 4 MG disintegrating tablet Take 4 mg by mouth daily as needed for nausea or vomiting.    [provider]  promethazine (PHENERGAN) 12.5 MG tablet Take 12.5 mg by mouth every 6 (six) hours as needed for nausea or vomiting.  11/08/14   [provider]  senna-docusate (SENOKOT-S) 8.6-50 MG tablet Take 2 tablets by mouth daily as needed for mild constipation.  02/27/15   [provider]   Allergies  Allergen Reactions  . Pregabalin Other (See Comments)    Sleepy  . Azithromycin Other (See Comments)    Unknown  . Cymbalta [Duloxetine Hcl] Other (See Comments)    Per MAR  . Erythromycin Hives    FAMILY HISTORY:  family history includes CAD in her mother; Cancer in her father; Hypertension in her mother; Kidney failure in her brother, mother, and son; Stroke in her mother. SOCIAL HISTORY:  reports that  has never smoked. she has never used  smokeless tobacco. She reports that she does not drink alcohol or use drugs.  REVIEW OF SYSTEMS: Positives in BOLD  Constitutional: Negative for fever, chills, weight loss, malaise/fatigue and diaphoresis.  HENT: Negative for hearing loss, ear pain, nosebleeds, congestion, sore throat, neck pain, tinnitus and ear discharge.   Eyes: Negative for blurred vision, double vision, photophobia, pain, discharge and redness.  Respiratory: cough, hemoptysis, sputum production, shortness of breath, wheezing and stridor.   Cardiovascular: chest pain, palpitations, orthopnea, claudication, leg swelling and PND.  Gastrointestinal: Negative for heartburn, nausea, vomiting, abdominal pain, diarrhea, constipation, blood in stool and melena.  Genitourinary: Negative for dysuria, urgency, frequency, hematuria and flank pain.  Musculoskeletal: Negative for myalgias, back pain, joint pain and falls.  Skin: Negative for itching and rash.  Neurological: Negative for dizziness, tingling, tremors, sensory change, speech change, focal weakness, seizures, loss of consciousness, weakness and headaches.  Endo/Heme/Allergies: Negative for environmental allergies and polydipsia. Does not bruise/bleed easily.  SUBJECTIVE:  Pt stating she is still wheezing with mild shortness of breath.  VITAL SIGNS: Temp:  [98.2 F (36.8 C)] 98.2 F (36.8 C) (01/14 2120) Pulse Rate:  [69-84] 76 (01/15 0000) Resp:  [16-43] 24 (01/15 0000) BP: (110-163)/(56-77) 110/56 (01/15 0000)  SpO2:  [91 %-97 %] 96 % (01/15 0000) Weight:  [136.1 kg (300 lb)-141.2 kg (311 lb 4.6 oz)] 141.2 kg (311 lb 4.6 oz) (01/14 2120)  PHYSICAL EXAMINATION: General: well developed, well nourished female, NAD  Neuro: alert and oriented, follows commands  HEENT: supple, no JVD  Cardiovascular: nsr, s1s2, no M/R/G Lungs: expiratory wheezes throughout, even, non labored  Abdomen: +BS x4, soft, obese, non tender, non distended  Musculoskeletal: 3+ bilateral lower  extremity pitting edema  Skin: no rashes or pressure ulcers   Recent Labs  Lab 11/10/17 1542  NA 143  K 6.3*  CL 109  CO2 23  BUN 74*  CREATININE 4.77*  GLUCOSE 91   Recent Labs  Lab 11/10/17 1542  HGB 6.8*  HCT 21.2*  WBC 5.8  PLT 171   Dg Chest 2 View  Result Date: 11/10/2017 CLINICAL DATA:  15 pound weight gain during the past week. History of end-stage renal disease. EXAM: CHEST  2 VIEW COMPARISON:  01/14/2016 FINDINGS: Examination is degraded due to patient body habitus. Grossly unchanged enlarged cardiac silhouette and mediastinal contours given persistently reduced lung volumes. Atherosclerotic plaque within the thoracic aorta. Mild pulmonary venous congestion without frank evidence of edema. Bibasilar heterogeneous opacities are unchanged and favored to represent atelectasis. No new focal airspace opacities. No pleural effusion or pneumothorax. No definite acute osseus abnormalities. Accentuated thoracic kyphosis. Post left hemi humeral arthroplasty, incompletely evaluated. IMPRESSION: Similar findings of cardiomegaly and pulmonary venous congestion without definitive superimposed acute cardiopulmonary disease on this hypoventilated, body habitus degraded examination. Specifically, no definite evidence of pulmonary edema. Electronically Signed   By: Sandi Mariscal M.D.   On: 11/10/2017 16:26    ASSESSMENT / PLAN: Acute on chronic respiratory failure secondary to pulmonary edema  CKD Stage V has progressed to ESRD with hyperkalemia  Anemia of chronic kidney disease  P: Supplemental O2 for dyspnea and/or hypoxia  Repeat CXR  Prn bronchodilator therapy  Nephrology consulted appreciate input-plan for vascular surgery to place permcath 11/11/17 for initiation of HD IV lasix  Trend BMP Replace electrolytes as indicated  Monitor UOP  Avoid nephrotoxic medications Continuous telemetry monitoring  Transfuse 1 unit pRBC's Trend CBC Monitor for s/sx of bleeding  Transfuse for  hgb <7 Trend WBC and monitor fever   Marda Stalker, [place] Pager [phone] (please enter 7 digits) PCCM Consult Pager [phone] (please enter 7 digits)

## 2017-11-11 NOTE — H&P (Signed)
[facility] History & Physical Update  The patient was interviewed and re-examined.  The patient's previous History and Physical has been reviewed and is unchanged.  There is no change in the plan of care. We plan to proceed with the scheduled procedure.  [name], MD  11/11/2017, 8:25 AM

## 2017-11-12 LAB — CBC WITH DIFFERENTIAL/PLATELET
Basophils Absolute: 0 10*3/uL (ref 0–0.1)
Basophils Relative: 0 %
EOS ABS: 0.3 10*3/uL (ref 0–0.7)
Eosinophils Relative: 5 %
HCT: 25.8 % — ABNORMAL LOW (ref 35.0–47.0)
HEMOGLOBIN: 8.5 g/dL — AB (ref 12.0–16.0)
LYMPHS ABS: 1.3 10*3/uL (ref 1.0–3.6)
Lymphocytes Relative: 22 %
MCH: 30.9 pg (ref 26.0–34.0)
MCHC: 32.9 g/dL (ref 32.0–36.0)
MCV: 94 fL (ref 80.0–100.0)
Monocytes Absolute: 0.9 10*3/uL (ref 0.2–0.9)
Monocytes Relative: 16 %
NEUTROS PCT: 57 %
Neutro Abs: 3.2 10*3/uL (ref 1.4–6.5)
Platelets: 152 10*3/uL (ref 150–440)
RBC: 2.75 MIL/uL — AB (ref 3.80–5.20)
RDW: 16 % — ABNORMAL HIGH (ref 11.5–14.5)
WBC: 5.7 10*3/uL (ref 3.6–11.0)

## 2017-11-12 LAB — BASIC METABOLIC PANEL
Anion gap: 11 (ref 5–15)
BUN: 35 mg/dL — ABNORMAL HIGH (ref 6–20)
CHLORIDE: 100 mmol/L — AB (ref 101–111)
CO2: 27 mmol/L (ref 22–32)
CREATININE: 3.49 mg/dL — AB (ref 0.44–1.00)
Calcium: 7.5 mg/dL — ABNORMAL LOW (ref 8.9–10.3)
GFR calc non Af Amer: 12 mL/min — ABNORMAL LOW (ref >60)
GFR, EST AFRICAN AMERICAN: 14 mL/min — AB (ref >60)
Glucose, Bld: 109 mg/dL — ABNORMAL HIGH (ref 65–99)
POTASSIUM: 4.5 mmol/L (ref 3.5–5.1)
SODIUM: 138 mmol/L (ref 135–145)

## 2017-11-12 LAB — HEPATIC FUNCTION PANEL
ALT: 10 U/L — ABNORMAL LOW (ref 14–54)
AST: 16 U/L (ref 15–41)
Albumin: 2.6 g/dL — ABNORMAL LOW (ref 3.5–5.0)
Alkaline Phosphatase: 66 U/L (ref 38–126)
Total Bilirubin: 0.5 mg/dL (ref 0.3–1.2)
Total Protein: 6 g/dL — ABNORMAL LOW (ref 6.5–8.1)

## 2017-11-12 LAB — HEPATITIS B CORE ANTIBODY, TOTAL: HEP B C TOTAL AB: POSITIVE — AB

## 2017-11-12 LAB — HEPATITIS B SURFACE ANTIBODY,QUALITATIVE: HEP B S AB: REACTIVE

## 2017-11-12 LAB — GLUCOSE, CAPILLARY
Glucose-Capillary: 76 mg/dL (ref 65–99)
Glucose-Capillary: 89 mg/dL (ref 65–99)
Glucose-Capillary: 129 mg/dL — ABNORMAL HIGH (ref 65–99)
Glucose-Capillary: 111 mg/dL — ABNORMAL HIGH (ref 65–99)

## 2017-11-12 LAB — PREPARE RBC (CROSSMATCH)

## 2017-11-12 LAB — VALPROIC ACID LEVEL: VALPROIC ACID LVL: 39 ug/mL — AB (ref 50.0–100.0)

## 2017-11-12 LAB — HEPATITIS B SURFACE ANTIGEN: Hepatitis B Surface Ag: NEGATIVE

## 2017-11-12 MED ORDER — SODIUM CHLORIDE 0.9 % IV SOLN
Freq: Once | INTRAVENOUS | Status: AC
  Administered 2017-11-12: 16:00:00 via INTRAVENOUS

## 2017-11-12 MED ORDER — ALBUMIN HUMAN 25 % IV SOLN
12.5000 g | Freq: Once | INTRAVENOUS | Status: DC
  Filled 2017-11-12: qty 50

## 2017-11-12 MED ORDER — FEBUXOSTAT 40 MG PO TABS
40.0000 mg | ORAL_TABLET | Freq: Every day | ORAL | Status: DC
  Administered 2017-11-13 – 2017-11-18 (×6): 40 mg via ORAL
  Filled 2017-11-12 (×6): qty 1

## 2017-11-12 MED ORDER — DEXTROSE 5 % IV SOLN
500.0000 mg | Freq: Three times a day (TID) | INTRAVENOUS | Status: DC
  Administered 2017-11-12 – 2017-11-13 (×2): 500 mg via INTRAVENOUS
  Filled 2017-11-12 (×4): qty 5

## 2017-11-12 MED ORDER — CARVEDILOL 6.25 MG PO TABS
3.1250 mg | ORAL_TABLET | Freq: Two times a day (BID) | ORAL | Status: DC
  Administered 2017-11-13 – 2017-11-18 (×10): 3.125 mg via ORAL
  Filled 2017-11-12 (×11): qty 1

## 2017-11-12 MED ORDER — LORAZEPAM 2 MG/ML IJ SOLN: 1.0000 mg | INTRAMUSCULAR | Status: DC | PRN

## 2017-11-12 NOTE — Progress Notes (Signed)
HD Tx started  

## 2017-11-12 NOTE — Progress Notes (Signed)
Post HD assessment  

## 2017-11-12 NOTE — Progress Notes (Signed)
Pre HD Assessment  

## 2017-11-12 NOTE — Progress Notes (Addendum)
   Name: Sarah Hamilton MRN: [id number] DOB: 1946/09/22    ADMISSION DATE:  11/10/2017 CONSULTATION DATE: 11/10/2016  REFERRING MD : Dr. Tressia Miners   CHIEF COMPLAINT: Shortness of Breath   BRIEF PATIENT DESCRIPTION:  72 yo female admitted with acute respiratory failure secondary to pulmonary edema, CKD stage V with progression to ESRD with hyperkalemia, and acute on chronic anemia.  Pt transferred to medsurg unit 01/15 required transfer back to ICU 01/16 due to acute respiratory failure following seizure activity   SIGNIFICANT EVENTS  01/14-Pt admitted to stepdown unit  01/15-Pt transferred to [facility] unit  01/16-Pt transferred back to ICU   STUDIES:  None   SUBJECTIVE:  No complaints at this time   VITAL SIGNS: Temp:  [98.9 F (37.2 C)-99.8 F (37.7 C)] 99.5 F (37.5 C) (01/16 1948) Pulse Rate:  [69-92] 82 (01/16 1948) Resp:  [13-20] 19 (01/16 1609) BP: (85-157)/(58-116) 140/70 (01/16 1948) SpO2:  [90 %-100 %] 92 % (01/16 1948) Weight:  [141.6 kg (312 lb 1.6 oz)-143 kg (315 lb 4.1 oz)] 142.2 kg (313 lb 7.9 oz) (01/16 1242)  PHYSICAL EXAMINATION: General: well developed, well nourished female, NAD  Neuro: confused to situation, follows commands  HEENT: supple, no JVD  Cardiovascular: nsr, s1s2, no M/R/G Lungs: diminished throughout, even, non labored  Abdomen: +BS x4, soft, obese, non tender, non distended  Musculoskeletal: 2+ bilateral lower extremity pitting edema  Skin: no rashes or pressure ulcers   Recent Labs  Lab 11/10/17 1542 11/11/17 0235 11/11/17 0454  NA 143  --  142  K 6.3* 5.2* 5.7*  CL 109  --  110  CO2 23  --  24  BUN 74*  --  70*  CREATININE 4.77*  --  4.55*  GLUCOSE 91  --  78   Recent Labs  Lab 11/10/17 1542 11/11/17 0454 11/11/17 1100  HGB 6.8* 6.9* 7.6*  HCT 21.2* 21.6* 23.8*  WBC 5.8 5.1  --   PLT 171 136*  --    No results found.  ASSESSMENT / PLAN: Acute on chronic respiratory failure secondary to pulmonary edema  Seizure  Activity  CKD Stage V has progressed to ESRD with hyperkalemia  Anemia of chronic kidney disease  P: Supplemental O2 for dyspnea and/or hypoxia  Repeat CXR  Prn bronchodilator therapy  Nephrology consulted appreciate inpuIV lasix  Stat BMP  Replace electrolytes as indicated  Monitor UOP  Avoid nephrotoxic medications Continuous telemetry monitoring  Trend CBC Monitor for s/sx of bleeding  Transfuse for hgb <7 Trend WBC and monitor fever  Stat valproic acid level  Continue depakote dosing per pharmacy  Prn ativan for seizure activity Neurology consulted appreciate input  Marda Stalker, [place] Pager [phone] (please enter 7 digits) PCCM Consult Pager [phone] (please enter 7 digits)

## 2017-11-12 NOTE — Progress Notes (Signed)
[facility], [place] 11/12/17  Subjective:   Patient seen during dialysis.  Tolerating well. Today is her second dialysis treatment Yesterday, 1500 cc was removed with dialysis She feels like she is able to breath better   HEMODIALYSIS FLOWSHEET:  Blood Flow Rate (mL/min): 250 mL/min Arterial Pressure (mmHg): -100 mmHg Venous Pressure (mmHg): 120 mmHg Transmembrane Pressure (mmHg): 60 mmHg Ultrafiltration Rate (mL/min): 110 mL/min Dialysate Flow Rate (mL/min): 500 ml/min Conductivity: Machine : 13.9 Conductivity: Machine : 13.9 Dialysis Fluid Bolus: Normal Saline Bolus Amount (mL): 250 mL    Objective:  Vital signs in last 24 hours:  Temp:  [98.3 F (36.8 C)-99.8 F (37.7 C)] 99 F (37.2 C) (01/16 0955) Pulse Rate:  [67-90] 71 (01/16 1145) Resp:  [13-24] 14 (01/16 1115) BP: (85-157)/(49-92) 85/63 (01/16 1145) SpO2:  [92 %-100 %] 100 % (01/16 1145) Weight:  [139.5 kg (307 lb 8.7 oz)-143 kg (315 lb 4.1 oz)] 143 kg (315 lb 4.1 oz) (01/16 0955)  Weight change: 4.99 kg (11 lb) Filed Weights   11/11/17 2248 11/12/17 0514 11/12/17 0955  Weight: (!) 141.7 kg (312 lb 4.8 oz) (!) 141.6 kg (312 lb 1.6 oz) (!) 143 kg (315 lb 4.1 oz)    Intake/Output:    Intake/Output Summary (Last 24 hours) at 11/12/2017 1206 Last data filed at 11/12/2017 1033 Gross per 24 hour  Intake 760 ml  Output 3666 ml  Net -2906 ml     Physical Exam: General:  Chronically ill-appearing, mild distress  HEENT  moist oral mucous membranes  Neck  supple  Pulm/lungs  diffuse bilateral crackles   CVS/Heart regular, no rub  Abdomen:   Soft, nontender   Extremities: 2+ pitting edema bilaterally  Neurologic:  Alert, able to follow commands  Skin:  Dry skin  Access:  Right arm developing brachiocephalic fistula, right femoral temporary dialysis catheter       Basic Metabolic Panel:  Recent Labs  Lab 11/10/17 1542 11/11/17 0235 11/11/17 0454  NA 143  --  142  K  6.3* 5.2* 5.7*  CL 109  --  110  CO2 23  --  24  GLUCOSE 91  --  78  BUN 74*  --  70*  CREATININE 4.77*  --  4.55*  CALCIUM 7.9*  --  7.3*     CBC: Recent Labs  Lab 11/10/17 1542 11/11/17 0454 11/11/17 1100  WBC 5.8 5.1  --   NEUTROABS 3.6  --   --   HGB 6.8* 6.9* 7.6*  HCT 21.2* 21.6* 23.8*  MCV 98.6 96.0  --   PLT 171 136*  --       Lab Results  Component Value Date   HEPBSAG Negative 11/11/2017   HEPBSAB Reactive 11/11/2017      Microbiology:  Recent Results (from the past 240 hour(s))  MRSA PCR Screening     Status: None   Collection Time: 11/10/17  9:41 PM  Result Value Ref Range Status   MRSA by PCR NEGATIVE NEGATIVE Final    Comment:        The GeneXpert MRSA Assay (FDA approved for NASAL specimens only), is one component of a comprehensive MRSA colonization surveillance program. It is not intended to diagnose MRSA infection nor to guide or monitor treatment for MRSA infections. Performed at [facility], [address], [place], [place] [address]     Coagulation Studies: Recent Labs    11/11/17 0454  LABPROT 15.0  INR 1.19  Urinalysis: No results for input(s): COLORURINE, LABSPEC, PHURINE, GLUCOSEU, HGBUR, BILIRUBINUR, KETONESUR, PROTEINUR, UROBILINOGEN, NITRITE, LEUKOCYTESUR in the last 72 hours.  Invalid input(s): APPERANCEUR    Imaging: Dg Chest 2 View  Result Date: 11/10/2017 CLINICAL DATA:  15 pound weight gain during the past week. History of end-stage renal disease. EXAM: CHEST  2 VIEW COMPARISON:  01/14/2016 FINDINGS: Examination is degraded due to patient body habitus. Grossly unchanged enlarged cardiac silhouette and mediastinal contours given persistently reduced lung volumes. Atherosclerotic plaque within the thoracic aorta. Mild pulmonary venous congestion without frank evidence of edema. Bibasilar heterogeneous opacities are unchanged and favored to represent atelectasis. No new focal airspace opacities. No  pleural effusion or pneumothorax. No definite acute osseus abnormalities. Accentuated thoracic kyphosis. Post left hemi humeral arthroplasty, incompletely evaluated. IMPRESSION: Similar findings of cardiomegaly and pulmonary venous congestion without definitive superimposed acute cardiopulmonary disease on this hypoventilated, body habitus degraded examination. Specifically, no definite evidence of pulmonary edema. Electronically Signed   By: Sandi Mariscal M.D.   On: 11/10/2017 16:26     Medications:   . sodium chloride Stopped (11/10/17 1844)   . atorvastatin  20 mg Oral QHS  . carvedilol  3.125 mg Oral BID  . cholecalciferol  1,000 Units Oral Daily  . divalproex  1,000 mg Oral QPM  . divalproex  250 mg Oral Daily  . divalproex  500 mg Oral q morning - 10a  . furosemide  80 mg Intravenous Q12H  . heparin  5,000 Units Subcutaneous Q8H  . isosorbide mononitrate  60 mg Oral Daily  . pantoprazole  40 mg Oral Daily  . sevelamer carbonate  800 mg Oral TID WC  . sodium bicarbonate  50 mEq Intravenous Once  . sodium bicarbonate  1,300 mg Oral BID  . sodium chloride flush  10-40 mL Intracatheter Q12H  . torsemide  100 mg Oral Daily  . tuberculin  5 Units Intradermal Once   acetaminophen **OR** acetaminophen, ipratropium-albuterol, nitroGLYCERIN, [DISCONTINUED] ondansetron **OR** ondansetron (ZOFRAN) IV, oxyCODONE, senna-docusate, sodium chloride flush  Assessment/ Plan:  72 y.o. African-American female with severe hypertension, chronic kidney disease, obesity gout, was admitted on 11/10/2017 with acute shortness of breath and volume overload.   1.  ESRD   2.  Acute pulmonary edema 3.  Anemia of chronic kidney disease 4.  Lower extremity edema 5.  Hyperkalemia  Dialysis was initiated on January 15 Today is her second dialysis treatment and she is tolerating well Potassium level is improving Blood pressure is now low normal therefore antihypertensive amlodipine was discontinued We will  continue to remove fluid with dialysis as tolerated Next treatment will be planned for tomorrow for longer duration Prior to discharge, patient will need an IJ tunneled dialysis catheter  she will also need to be able to sit in a chair for dialysis prior to discharge Blood transfusion is planned for anemia.  We will obtain iron studies and prescribed IV iron or Epogen as necessary  D/C planning is in progress PPD was placed on 1/15 Hep B studies (core Ab + and Surface Ab POS) Antigen is NEG Patient pathways is coordinating care for outpatient dialysis center       LOS: 2 Sarah Hamilton [facility] 1/16/201912:06 PM  [facility] [place], [place]

## 2017-11-12 NOTE — Progress Notes (Signed)
[place] [facility] [place] NAME: Siana Panameno    MR#:  [id number]  DATE OF BIRTH:  Jun 13, 1946  SUBJECTIVE:  CHIEF COMPLAINT:   Chief Complaint  Patient presents with  . Shortness of Breath  Patient currently receiving hemodialysis, case discussed with nephrology/Dr. Singh-transfuse 1 unit packed red blood cells, plan is for permacath placement later this week with tentative plans for discharge on Friday  REVIEW OF SYSTEMS:  CONSTITUTIONAL: No fever, fatigue or weakness.  EYES: No blurred or double vision.  EARS, NOSE, AND THROAT: No tinnitus or ear pain.  RESPIRATORY: No cough, shortness of breath, wheezing or hemoptysis.  CARDIOVASCULAR: No chest pain, orthopnea, edema.  GASTROINTESTINAL: No nausea, vomiting, diarrhea or abdominal pain.  GENITOURINARY: No dysuria, hematuria.  ENDOCRINE: No polyuria, nocturia,  HEMATOLOGY: No anemia, easy bruising or bleeding SKIN: No rash or lesion. MUSCULOSKELETAL: No joint pain or arthritis.   NEUROLOGIC: No tingling, numbness, weakness.  PSYCHIATRY: No anxiety or depression.   ROS  DRUG ALLERGIES:   Allergies  Allergen Reactions  . Pregabalin Other (See Comments)    Sleepy  . Azithromycin Other (See Comments)    Unknown  . Cymbalta [Duloxetine Hcl] Other (See Comments)    Per MAR  . Erythromycin Hives    VITALS:  Blood pressure (!) 139/116, pulse 81, temperature 98.9 F (37.2 C), temperature source Oral, resp. rate 20, height 5\' 2"  (1.575 m), weight (!) 142.2 kg (313 lb 7.9 oz), SpO2 100 %.  PHYSICAL EXAMINATION:  GENERAL:  72 y.o.-year-old patient lying in the bed with no acute distress.  EYES: Pupils equal, round, reactive to light and accommodation. No scleral icterus. Extraocular muscles intact.  HEENT: Head atraumatic, normocephalic. Oropharynx and nasopharynx clear.  NECK:  Supple, no jugular venous distention. No thyroid enlargement, no tenderness.  LUNGS: Normal breath sounds  bilaterally, no wheezing, rales,rhonchi or crepitation. No use of accessory muscles of respiration.  CARDIOVASCULAR: S1, S2 normal. No murmurs, rubs, or gallops.  ABDOMEN: Soft, nontender, nondistended. Bowel sounds present. No organomegaly or mass.  EXTREMITIES: No pedal edema, cyanosis, or clubbing.  NEUROLOGIC: Cranial nerves II through XII are intact. Muscle strength 5/5 in all extremities. Sensation intact. Gait not checked.  PSYCHIATRIC: The patient is alert and oriented x 3.  SKIN: No obvious rash, lesion, or ulcer.   Physical Exam LABORATORY PANEL:   CBC Recent Labs  Lab 11/11/17 0454 11/11/17 1100  WBC 5.1  --   HGB 6.9* 7.6*  HCT 21.6* 23.8*  PLT 136*  --    ------------------------------------------------------------------------------------------------------------------  Chemistries  Recent Labs  Lab 11/11/17 0454 11/12/17 1112  NA 142  --   K 5.7*  --   CL 110  --   CO2 24  --   GLUCOSE 78  --   BUN 70*  --   CREATININE 4.55*  --   CALCIUM 7.3*  --   AST  --  16  ALT  --  10*  ALKPHOS  --  66  BILITOT  --  0.5   ------------------------------------------------------------------------------------------------------------------  Cardiac Enzymes Recent Labs  Lab 11/10/17 1542 11/11/17 0454  TROPONINI <0.03 <0.03   ------------------------------------------------------------------------------------------------------------------  RADIOLOGY:  Dg Chest 2 View  Result Date: 11/10/2017 CLINICAL DATA:  15 pound weight gain during the past week. History of end-stage renal disease. EXAM: CHEST  2 VIEW COMPARISON:  01/14/2016 FINDINGS: Examination is degraded due to patient body habitus. Grossly unchanged enlarged cardiac silhouette and mediastinal contours given persistently reduced lung  volumes. Atherosclerotic plaque within the thoracic aorta. Mild pulmonary venous congestion without frank evidence of edema. Bibasilar heterogeneous opacities are unchanged and  favored to represent atelectasis. No new focal airspace opacities. No pleural effusion or pneumothorax. No definite acute osseus abnormalities. Accentuated thoracic kyphosis. Post left hemi humeral arthroplasty, incompletely evaluated. IMPRESSION: Similar findings of cardiomegaly and pulmonary venous congestion without definitive superimposed acute cardiopulmonary disease on this hypoventilated, body habitus degraded examination. Specifically, no definite evidence of pulmonary edema. Electronically Signed   By: Sandi Mariscal M.D.   On: 11/10/2017 16:26    ASSESSMENT AND PLAN:  [name] a71 y.o.femalewith a known history of hypertension, CKD stage5,CAD status post stents, GERD, bilateral lower extremity edema, chronic iron deficiency anemia,seizure disorder presents to hospital secondary to worsening shortness of breath.  1 acute end-stage renal disease Now requiring hemodialysis Currently has temporary hemodialysis catheter in the groin, currently receiving daily hemodialysis, transfuse 1 unit packed red blood cells in discussion with Dr. Janann Colonel, plans for more permanent hemodialysis catheter placement later this week with tentative plans for discharge on Friday if dialysis chair can be found in the [place] area  2 acute hyperkalemia Secondary to above Resolved with calcium gluconate, kayexalate,IV dextrose/insulin, and hemodialysis   3 acute hypoxic respiratory failure Secondary to acute fluid overload state from newly diagnosed end-stage renal disease Resolved with hemodialysis, did require 1 night in the ICU on BiPAP which was successfully weaned off  4 acute on chronic anemia most likely secondary to end-stage renal disease  Transfuse another unit of packed red blood cells as stated above, CBC daily, transfuse as needed, on Epogen now  5 chronic GERD without esophagitis  PPI daily   6 chronic seizure disorder, unspecified Continue Depakote   All the records  are reviewed and case discussed with Care Management/Social Workerr. Management plans discussed with the patient, family and they are in agreement.  CODE STATUS: full  TOTAL TIME TAKING CARE OF THIS PATIENT: 40 minutes.     POSSIBLE D/C IN 2-4 DAYS, DEPENDING ON CLINICAL CONDITION.   Avel Peace Necole Minassian M.D on 11/12/2017   Between 7am to 6pm - Pager - [phone]  After 6pm go to [email or web address] - password [email or web address] [facility]  Office  [phone]  CC: Primary care physician; System, Pcp Not In  Note: This dictation was prepared with Dragon dictation along with smaller phrase technology. Any transcriptional errors that result from this process are unintentional.

## 2017-11-12 NOTE — Progress Notes (Signed)
Received pt from floor on NRB mask Sat 100 RR 24 HR 90, respirations unlabored. Pt pulling on mask, placed pt on 5Lnc, pt appears to resting comfortably, will continue to monitor

## 2017-11-12 NOTE — Care Management (Signed)
PPD was placed 11.15.2019 around 1:30 pm.  First treatment 1.15.2019. Temporary dialysis cath in groin so sitting  for first treatment was contraindicated. For perm cath today.  Results for CIIN, BRAZZEL (MRN [id number]) as of 11/12/2017 10:12  Ref. Range 11/11/2017 14:15  Hepatitis B Surface Ag Latest Ref Range: Negative  Negative  Hep B S Ab Unknown Reactive  Hep B Core Ab, Tot Latest Ref Range: Negative  Positive (A)

## 2017-11-12 NOTE — Progress Notes (Signed)
UF goal reduced to support B/P, pt stable asymptomatic

## 2017-11-12 NOTE — Progress Notes (Signed)
Patient blood transfusion was stopped to avoid the blood  from transfusing for more than 4 hours. Blood transfusion was started at 1537 and it was stopped at 1932.   Patient has about 147mL of blood not transfused.   Post transfusion VS obtained.

## 2017-11-12 NOTE — Progress Notes (Signed)
HD tx end  

## 2017-11-12 NOTE — Progress Notes (Addendum)
MEDICATION RELATED CONSULT NOTE - INITIAL   Pharmacy Consult for IV valproic acid  Indication: Unable to tolerate PO  Allergies  Allergen Reactions  . Pregabalin Other (See Comments)    Sleepy  . Azithromycin Other (See Comments)    Unknown  . Cymbalta [Duloxetine Hcl] Other (See Comments)    Per MAR  . Erythromycin Hives    Patient Measurements: Height: 5\' 2"  (157.5 cm) Weight: (!) 313 lb 7.9 oz (142.2 kg) IBW/kg (Calculated) : 50.1 Adjusted Body Weight: 87 kg  Vital Signs: Temp: 99.5 F (37.5 C) (01/16 1948) Temp Source: Oral (01/16 1948) BP: 142/93 (01/16 2200) Pulse Rate: 89 (01/16 2200) Intake/Output from previous day: 01/15 0701 - 01/16 0700 In: 880 [P.O.:880] Out: 2966 [Urine:1400] Intake/Output from this shift: Total I/O In: 472 [P.O.:240; Blood:232] Out: -   Labs: Recent Labs    11/10/17 1542 11/11/17 0454 11/11/17 1100 11/12/17 1112 11/12/17 2207  WBC 5.8 5.1  --   --  5.7  HGB 6.8* 6.9* 7.6*  --  8.5*  HCT 21.2* 21.6* 23.8*  --  25.8*  PLT 171 136*  --   --  152  CREATININE 4.77* 4.55*  --   --  3.49*  ALBUMIN  --   --   --  2.6*  --   PROT  --   --   --  6.0*  --   AST  --   --   --  16  --   ALT  --   --   --  10*  --   ALKPHOS  --   --   --  66  --   BILITOT  --   --   --  0.5  --   BILIDIR  --   --   --  <0.1*  --   IBILI  --   --   --  NOT CALCULATED  --    Estimated Creatinine Clearance: 20 mL/min (A) (by C-G formula based on SCr of 3.49 mg/dL (H)).   Microbiology: Recent Results (from the past 720 hour(s))  MRSA PCR Screening     Status: None   Collection Time: 11/10/17  9:41 PM  Result Value Ref Range Status   MRSA by PCR NEGATIVE NEGATIVE Final    Comment:        The GeneXpert MRSA Assay (FDA approved for NASAL specimens only), is one component of a comprehensive MRSA colonization surveillance program. It is not intended to diagnose MRSA infection nor to guide or monitor treatment for MRSA infections. Performed at  [facility], [address] [place], [place] [address]     Medical History: Past Medical History:  Diagnosis Date  . Anemia   . Anginal pain ([place])   . Arthritis   . CHF (congestive heart failure) ([place])   . CKD (chronic kidney disease), stage IV ([place])   . Coronary artery disease   . Dementia   . Diverticulitis   . Diverticulosis   . Dysphagia   . Dyspnea   . Edema    FEET/LEGS  . GERD (gastroesophageal reflux disease)   . Gout   . Heart murmur   . Hepatitis    B  . Hyperlipidemia   . Hyperparathyroidism ([place])   . Hypertension   . Hypothyroidism   . Orthopnea   . Pain    CHRONIC PAIN SYNDROME  . Palpitations   . Seizures ([place])    last seizure end of November 2018  .  Spinal stenosis   . Tremor    ESSENTIAL  . Wheezing     Medications:  Scheduled:  . atorvastatin  20 mg Oral QHS  . carvedilol  3.125 mg Oral BID  . cholecalciferol  1,000 Units Oral Daily  . [START ON 11/13/2017] febuxostat  40 mg Oral Daily  . heparin  5,000 Units Subcutaneous Q8H  . isosorbide mononitrate  60 mg Oral Daily  . pantoprazole  40 mg Oral Daily  . sevelamer carbonate  800 mg Oral TID WC  . sodium bicarbonate  50 mEq Intravenous Once  . sodium chloride flush  10-40 mL Intracatheter Q12H  . torsemide  100 mg Oral Daily  . tuberculin  5 Units Intradermal Once    Assessment: Patient admitted for acute pulmonary distress and has a h/o of seizures. Patient seems lethargic and isn't able to take any PO medications at the moment Pharmacy has been consulted to dose IV valproic acid  Goal of Therapy:  Valproic acid level 50 - 100 mcg/mL for seizures  Plan:  Patient takes a total daily dose of 1750 mg of depakote ER. Will start patient on a regimen of IV valproic acid 500 mg TID for a total daily dose of 1500 mg which is 250 mg short of patient's PO dose; however, patient is only expected to be getting this for one day until PO status improves. Will check a depakote prior to  the 4th dose 01/17 @ 2300 to ensure a therapeutic level.  Tobie Lords, PharmD, BCPS Clinical Pharmacist 11/12/2017

## 2017-11-12 NOTE — Care Management (Signed)
Spoke with Sherita from [facility] and informed that [facility] has accepted this patient for admission when medically stable.  Updated CSW

## 2017-11-12 NOTE — Progress Notes (Signed)
Patient has an episode of active seizure while been given a bed bath. Patient seizure lasted for about 5 minutes. Patient was unconscious, jerking all extremities and was foaming from her mouth. Care RN stayed with patient and  initiated  Rapid Response. Patient remained NSR with palpable pulses through the seizure episode.Patient was later placed on 4L of supplemental oxygen via nasal canula following oxygen saturation in the low 60s. Patient has labored breathing, and she was later placed on non-rebreather and transferred to the ICU room 9.

## 2017-11-12 NOTE — Progress Notes (Signed)
Pre Hd Tx

## 2017-11-13 LAB — CBC
HEMATOCRIT: 25.7 % — AB (ref 35.0–47.0)
Hemoglobin: 8.4 g/dL — ABNORMAL LOW (ref 12.0–16.0)
MCH: 31.1 pg (ref 26.0–34.0)
MCHC: 32.8 g/dL (ref 32.0–36.0)
MCV: 95.1 fL (ref 80.0–100.0)
Platelets: 144 10*3/uL — ABNORMAL LOW (ref 150–440)
RBC: 2.7 MIL/uL — ABNORMAL LOW (ref 3.80–5.20)
RDW: 16.4 % — AB (ref 11.5–14.5)
WBC: 6 10*3/uL (ref 3.6–11.0)

## 2017-11-13 LAB — VALPROIC ACID LEVEL: VALPROIC ACID LVL: 29 ug/mL — AB (ref 50.0–100.0)

## 2017-11-13 LAB — GLUCOSE, CAPILLARY
GLUCOSE-CAPILLARY: 82 mg/dL (ref 65–99)
GLUCOSE-CAPILLARY: 113 mg/dL — AB (ref 65–99)
Glucose-Capillary: 79 mg/dL (ref 65–99)
Glucose-Capillary: 119 mg/dL — ABNORMAL HIGH (ref 65–99)

## 2017-11-13 LAB — RENAL FUNCTION PANEL
Albumin: 2.4 g/dL — ABNORMAL LOW (ref 3.5–5.0)
Anion gap: 7 (ref 5–15)
BUN: 40 mg/dL — AB (ref 6–20)
CHLORIDE: 98 mmol/L — AB (ref 101–111)
CO2: 30 mmol/L (ref 22–32)
Calcium: 7.5 mg/dL — ABNORMAL LOW (ref 8.9–10.3)
Creatinine, Ser: 3.43 mg/dL — ABNORMAL HIGH (ref 0.44–1.00)
GFR calc Af Amer: 14 mL/min — ABNORMAL LOW (ref >60)
GFR calc non Af Amer: 12 mL/min — ABNORMAL LOW (ref >60)
GLUCOSE: 97 mg/dL (ref 65–99)
POTASSIUM: 4.3 mmol/L (ref 3.5–5.1)
Phosphorus: 5.2 mg/dL — ABNORMAL HIGH (ref 2.5–4.6)
Sodium: 135 mmol/L (ref 135–145)

## 2017-11-13 LAB — IRON AND TIBC
Iron: 66 ug/dL (ref 28–170)
Saturation Ratios: 28 % (ref 10.4–31.8)
TIBC: 238 ug/dL — ABNORMAL LOW (ref 250–450)
UIBC: 172 ug/dL

## 2017-11-13 LAB — TRANSFERRIN: Transferrin: 163 mg/dL — ABNORMAL LOW (ref 192–382)

## 2017-11-13 LAB — FERRITIN: FERRITIN: 289 ng/mL (ref 11–307)

## 2017-11-13 MED ORDER — VALPROATE SODIUM 500 MG/5ML IV SOLN
500.0000 mg | Freq: Once | INTRAVENOUS | Status: AC
  Administered 2017-11-13: 500 mg via INTRAVENOUS
  Filled 2017-11-13: qty 5

## 2017-11-13 MED ORDER — IPRATROPIUM-ALBUTEROL 0.5-2.5 (3) MG/3ML IN SOLN
3.0000 mL | RESPIRATORY_TRACT | Status: DC | PRN
  Administered 2017-11-16: 3 mL via RESPIRATORY_TRACT
  Filled 2017-11-13: qty 3

## 2017-11-13 MED ORDER — VALPROIC ACID 250 MG PO CAPS
500.0000 mg | ORAL_CAPSULE | Freq: Three times a day (TID) | ORAL | Status: DC
  Administered 2017-11-13 – 2017-11-18 (×13): 500 mg via ORAL
  Filled 2017-11-13 (×17): qty 2

## 2017-11-13 MED ORDER — DIVALPROEX SODIUM ER 500 MG PO TB24: 750.0000 mg | ORAL_TABLET | Freq: Every day | ORAL | Status: DC

## 2017-11-13 MED ORDER — DIVALPROEX SODIUM ER 500 MG PO TB24
1000.0000 mg | ORAL_TABLET | Freq: Every day | ORAL | Status: DC
  Filled 2017-11-13: qty 2

## 2017-11-13 MED ORDER — ISOSORBIDE MONONITRATE ER 60 MG PO TB24
60.0000 mg | ORAL_TABLET | Freq: Every evening | ORAL | Status: DC
  Administered 2017-11-14 – 2017-11-16 (×3): 60 mg via ORAL
  Filled 2017-11-13 (×5): qty 1

## 2017-11-13 NOTE — Progress Notes (Signed)
Hemodialysis treatment started. 

## 2017-11-13 NOTE — Progress Notes (Signed)
MEDICATION RELATED CONSULT NOTE - FOLLOW UP   Pharmacy Consult for IV valproic acid. Indication: unable to tolerate by mouth.   Assessment: Patient admitted for acute pulmonary distress and has a history of seizures. The patient missed Depakote ER 750mg  morning dose on 1/15. Pharmacy was consulted to dose IV valproic acid due to intolerance of PO medications Patient is now tolerating PO medications.   Goal of Therapy:  Valproic acid level 50 - 100 mcg/mL for seizures   Plan:  1/16 @ 2230 valproic acid level: 39 mcg/mL 1/17 @ 1230 valproic acid level: 29 mcg/mL  Discontinue IV valproic acid 500mg  TID. Per Neuro note will continue valproic acid 500mg  PO TID.   Changes to therapy: IV valproic acid >> PO Depakote ER     Medications:  Medications Prior to Admission  Medication Sig Dispense Refill Last Dose  . amLODipine (NORVASC) 5 MG tablet Take 5 mg by mouth daily.   11/10/2017 at Unknown time  . aspirin EC 81 MG tablet Take 81 mg by mouth daily.   11/10/2017 at Unknown time  . atorvastatin (LIPITOR) 20 MG tablet Take 20 mg by mouth at bedtime.   11/10/2017 at Unknown time  . camphor-menthol (SARNA) lotion Apply 1 application topically 2 (two) times daily as needed for itching.   11/10/2017 at Unknown time  . carvedilol (COREG) 25 MG tablet Take 25 mg by mouth 2 (two) times daily.   11/10/2017 at Unknown time  . Cholecalciferol (VITAMIN D3) 1000 units CAPS Take 1,000 Units by mouth daily.   11/10/2017 at Unknown time  . divalproex (DEPAKOTE ER) 250 MG 24 hr tablet Take 250 mg by mouth daily.   11/10/2017 at Unknown time  . divalproex (DEPAKOTE ER) 500 MG 24 hr tablet Take 500 mg by mouth See admin instructions. Take 500 mg by mouth in the morning and take 1000 mg by mouth in the evening    11/10/2017 at Unknown time  . febuxostat (ULORIC) 40 MG tablet Take 40 mg by mouth daily.   11/10/2017 at Unknown time  . furosemide (LASIX) 40 MG tablet Take 80 mg by mouth daily.   11/10/2017 at  Unknown time  . isosorbide mononitrate (IMDUR) 60 MG 24 hr tablet Take 60 mg by mouth daily.   11/10/2017 at Unknown time  . oxyCODONE-acetaminophen (PERCOCET/ROXICET) 5-325 MG tablet Take 1-2 tablets by mouth every 6 (six) hours as needed for moderate pain. 30 tablet 0 11/10/2017 at Unknown time  . pantoprazole (PROTONIX) 40 MG tablet Take 1 tablet (40 mg total) by mouth daily. 30 tablet 0 11/10/2017 at Unknown time  . sevelamer (RENAGEL) 800 MG tablet Take 800 mg by mouth 3 (three) times daily with meals.   11/10/2017 at Unknown time  . sodium bicarbonate 650 MG tablet Take 1,300 mg by mouth 2 (two) times daily.    11/10/2017 at Unknown time  . sodium polystyrene (KAYEXALATE) powder Take 15 g by mouth daily.   11/10/2017 at Unknown time  . acetaminophen (TYLENOL) 325 MG tablet Take 325-650 mg by mouth every 8 (eight) hours as needed for moderate pain.   prn at prn  . diclofenac sodium (VOLTAREN) 1 % GEL Apply 2-4 g topically 4 (four) times daily as needed (pain).    prn at prn  . naloxone (NARCAN) nasal spray 4 mg/0.1 mL Place 1 spray into the nose once.    prn at prn  . nitroGLYCERIN (NITROSTAT) 0.4 MG SL tablet Place 0.4 mg under the tongue every 5 (  five) minutes as needed for chest pain.    prn at prn  . nystatin (NYSTATIN) powder Apply 1 g topically 2 (two) times daily.   prn at prn  . ondansetron (ZOFRAN-ODT) 4 MG disintegrating tablet Take 4 mg by mouth daily as needed for nausea or vomiting.   prn at prn  . promethazine (PHENERGAN) 12.5 MG tablet Take 12.5 mg by mouth every 6 (six) hours as needed for nausea or vomiting.    prn at prn  . senna-docusate (SENOKOT-S) 8.6-50 MG tablet Take 2 tablets by mouth daily as needed for mild constipation.    prn at prn      Allergies  Allergen Reactions  . Pregabalin Other (See Comments)    Sleepy  . Azithromycin Other (See Comments)    Unknown  . Cymbalta [Duloxetine Hcl] Other (See Comments)    Per MAR  . Erythromycin Hives    Patient  Measurements: Height: 5\' 2"  (157.5 cm) Weight: (!) 308 lb 10.3 oz (140 kg) IBW/kg (Calculated) : 50.1  Vital Signs: Temp: 98.7 F (37.1 C) (01/17 1226) Temp Source: Oral (01/17 1226) BP: 144/96 (01/17 1230) Pulse Rate: 73 (01/17 1230) Intake/Output from previous day: 01/16 0701 - 01/17 0700 In: 797 [P.O.:480; Blood:262; IV Piggyback:55] Out: 2952 [Urine:1500] Intake/Output from this shift: Total I/O In: -  Out: 1819 [Other:1819]  Labs: Recent Labs    11/11/17 0454 11/11/17 1100 11/12/17 1112 11/12/17 2207 11/13/17 0442  WBC 5.1  --   --  5.7 6.0  HGB 6.9* 7.6*  --  8.5* 8.4*  HCT 21.6* 23.8*  --  25.8* 25.7*  PLT 136*  --   --  152 144*  CREATININE 4.55*  --   --  3.49* 3.43*  PHOS  --   --   --   --  5.2*  ALBUMIN  --   --  2.6*  --  2.4*  PROT  --   --  6.0*  --   --   AST  --   --  16  --   --   ALT  --   --  10*  --   --   ALKPHOS  --   --  66  --   --   BILITOT  --   --  0.5  --   --   BILIDIR  --   --  <0.1*  --   --   IBILI  --   --  NOT CALCULATED  --   --    Estimated Creatinine Clearance: 20.2 mL/min (A) (by C-G formula based on SCr of 3.43 mg/dL (H)).    Pharmacy will continue to monitor per consult  Martinique Gerber 11/13/2017,1:25 PM

## 2017-11-13 NOTE — Progress Notes (Signed)
[place] [facility] [place] NAME: [name]    MR#:  [id number]  DATE OF BIRTH:  January 31, 1946  SUBJECTIVE:  CHIEF COMPLAINT:   Chief Complaint  Patient presents with  . Shortness of Breath  Patient without complaint, patient transferred to ICU for seizure, status post hemodialysis today  REVIEW OF SYSTEMS:  CONSTITUTIONAL: No fever, fatigue or weakness.  EYES: No blurred or double vision.  EARS, NOSE, AND THROAT: No tinnitus or ear pain.  RESPIRATORY: No cough, shortness of breath, wheezing or hemoptysis.  CARDIOVASCULAR: No chest pain, orthopnea, edema.  GASTROINTESTINAL: No nausea, vomiting, diarrhea or abdominal pain.  GENITOURINARY: No dysuria, hematuria.  ENDOCRINE: No polyuria, nocturia,  HEMATOLOGY: No anemia, easy bruising or bleeding SKIN: No rash or lesion. MUSCULOSKELETAL: No joint pain or arthritis.   NEUROLOGIC: No tingling, numbness, weakness.  PSYCHIATRY: No anxiety or depression.   ROS  DRUG ALLERGIES:   Allergies  Allergen Reactions  . Pregabalin Other (See Comments)    Sleepy  . Azithromycin Other (See Comments)    Unknown  . Cymbalta [Duloxetine Hcl] Other (See Comments)    Per MAR  . Erythromycin Hives    VITALS:  Blood pressure (!) 129/102, pulse 82, temperature 98.7 F (37.1 C), temperature source Oral, resp. rate (!) 24, height 5\' 2"  (1.575 m), weight (!) 140 kg (308 lb 10.3 oz), SpO2 100 %.  PHYSICAL EXAMINATION:  GENERAL:  72 y.o.-year-old patient lying in the bed with no acute distress.  EYES: Pupils equal, round, reactive to light and accommodation. No scleral icterus. Extraocular muscles intact.  HEENT: Head atraumatic, normocephalic. Oropharynx and nasopharynx clear.  NECK:  Supple, no jugular venous distention. No thyroid enlargement, no tenderness.  LUNGS: Normal breath sounds bilaterally, no wheezing, rales,rhonchi or crepitation. No use of accessory muscles of respiration.  CARDIOVASCULAR: S1,  S2 normal. No murmurs, rubs, or gallops.  ABDOMEN: Soft, nontender, nondistended. Bowel sounds present. No organomegaly or mass.  EXTREMITIES: No pedal edema, cyanosis, or clubbing.  NEUROLOGIC: Cranial nerves II through XII are intact. Muscle strength 5/5 in all extremities. Sensation intact. Gait not checked.  PSYCHIATRIC: The patient is alert and oriented x 3.  SKIN: No obvious rash, lesion, or ulcer.   Physical Exam LABORATORY PANEL:   CBC Recent Labs  Lab 11/13/17 0442  WBC 6.0  HGB 8.4*  HCT 25.7*  PLT 144*   ------------------------------------------------------------------------------------------------------------------  Chemistries  Recent Labs  Lab 11/12/17 1112  11/13/17 0442  NA  --    < > 135  K  --    < > 4.3  CL  --    < > 98*  CO2  --    < > 30  GLUCOSE  --    < > 97  BUN  --    < > 40*  CREATININE  --    < > 3.43*  CALCIUM  --    < > 7.5*  AST 16  --   --   ALT 10*  --   --   ALKPHOS 66  --   --   BILITOT 0.5  --   --    < > = values in this interval not displayed.   ------------------------------------------------------------------------------------------------------------------  Cardiac Enzymes Recent Labs  Lab 11/10/17 1542 11/11/17 0454  TROPONINI <0.03 <0.03   ------------------------------------------------------------------------------------------------------------------  RADIOLOGY:  No results found.  ASSESSMENT AND PLAN:  [name] a72 y.o.femalewith a known history of hypertension, CKD stage5,CAD status post stents, GERD, bilateral  lower extremity edema, chronic iron deficiency anemia,seizure disorder presents to hospital secondary to worsening shortness of breath.  1 acute end-stage renal disease Now requiring hemodialysis Currently has temporary hemodialysis catheter in the groin, currently receiving daily hemodialysis daily, in discussion with Dr. Janann Colonel - plans for more permanent hemodialysis catheter  placement later this week with tentative plans for discharge on Friday if dialysis chair can be found in the [place] area  2 acute hyperkalemia Secondary to above Resolved with calcium gluconate, kayexalate,IV dextrose/insulin, and hemodialysis   3 acute hypoxic respiratory failure Secondary to acute fluid overload state from newly diagnosed end-stage renal disease Resolved with hemodialysis, did require 1 night in the ICU on BiPAP which was successfully weaned off  4 acute on chronic anemia most likely secondary to end-stage renal disease  Transfuse another unit of packed red blood cells as stated above, CBC daily, transfuse as needed, on Epogen now  5 chronic GERD without esophagitis  PPI daily   6  acute on chronic seizure disorder, unspecified Patient transferred from floor to ICU for overnight observation, neurology to see, continue Depakote, Ativan as needed seizures, seizure precautions  All the records are reviewed and case discussed with Care Management/Social Workerr. Management plans discussed with the patient, family and they are in agreement.  CODE STATUS: full  TOTAL TIME TAKING CARE OF THIS PATIENT: 35 minutes.     POSSIBLE D/C IN 3-5 DAYS, DEPENDING ON CLINICAL CONDITION.   Avel Peace Salary M.D on 11/13/2017   Between 7am to 6pm - Pager - [phone]  After 6pm go to [email or web address] - password [email or web address] [facility]  Office  [phone]  CC: Primary care physician; System, Pcp Not In  Note: This dictation was prepared with Dragon dictation along with smaller phrase technology. Any transcriptional errors that result from this process are unintentional.

## 2017-11-13 NOTE — Progress Notes (Signed)
Dialysis ended safely

## 2017-11-13 NOTE — Clinical Social Work Note (Signed)
Patient with dementia and is cared for at home. CSW consulted by RN CM stating that [facility] had informed her that they made arrangements for patient to go to [facility] at discharge. CSW followed up with [facility] and he stated he received a call from the [place] social worker but that he had informed her he would be waiting for the referral. CSW has left a message for [facility] and has completed referral and sent to [facility]. Shela Leff MSW,LCSW [phone]

## 2017-11-13 NOTE — Consult Note (Signed)
Reason for Consult:seizures   CC: seizures   HPI: Conception Sarah Hamilton is an 72 y.o. female with a known history of hypertension, CKD stage5,CAD status post stents, GERD, bilateral lower extremity edema, chronic iron deficiency anemia,seizure disorder presents to hospital secondary to worsening shortness of breath.Found to have acute on chronic ESRD. Pt has hx of seizures on depakote at home.  VPA level of 39. Transferred to ICU due to seizure activity. Unknown last seizure episode.      Past Medical History:  Diagnosis Date  . Anemia   . Anginal pain ([place])   . Arthritis   . CHF (congestive heart failure) ([place])   . CKD (chronic kidney disease), stage IV ([place])   . Coronary artery disease   . Dementia   . Diverticulitis   . Diverticulosis   . Dysphagia   . Dyspnea   . Edema    FEET/LEGS  . GERD (gastroesophageal reflux disease)   . Gout   . Heart murmur   . Hepatitis    B  . Hyperlipidemia   . Hyperparathyroidism ([place])   . Hypertension   . Hypothyroidism   . Orthopnea   . Pain    CHRONIC PAIN SYNDROME  . Palpitations   . Seizures ([place])    last seizure end of November 2018  . Spinal stenosis   . Tremor    ESSENTIAL  . Wheezing     Past Surgical History:  Procedure Laterality Date  . ABDOMINAL HYSTERECTOMY    . AV FISTULA PLACEMENT Right 10/16/2017   Procedure: ARTERIOVENOUS (AV) FISTULA CREATION (BRACHIOCEPHALIC);  Surgeon: Algernon Huxley, MD;  Location: [facility] ORS;  Service: Vascular;  Laterality: Right;  . CATARACT EXTRACTION W/PHACO Left 11/07/2016   Procedure: CATARACT EXTRACTION PHACO AND INTRAOCULAR LENS PLACEMENT (IOC);  Surgeon: Eulogio Bear, MD;  Location: [facility] ORS;  Service: Ophthalmology;  Laterality: Left;  Lot # [id number] H [place]: 00:38.0 AP%: 10.1 CDE: 3.86  . CATARACT EXTRACTION W/PHACO Right 01/09/2017   Procedure: CATARACT EXTRACTION PHACO AND INTRAOCULAR LENS PLACEMENT (IOC);  Surgeon: Eulogio Bear, MD;  Location: [facility] ORS;  Service: Ophthalmology;   Laterality: Right;  [place] 54.8 AP% 5.4 CDE 2.96 Fluid pack lot # [id number]  . CHEST SURGERY     BENIGN TUMOR  . CHOLECYSTECTOMY    . CORONARY ANGIOPLASTY     STENT  . DIALYSIS/PERMA CATHETER INSERTION N/A 11/11/2017   Procedure: DIALYSIS/PERMA CATHETER INSERTION;  Surgeon: Katha Cabal, MD;  Location: [facility] CV LAB;  Service: Cardiovascular;  Laterality: N/A;  . EYE SURGERY    . JOINT REPLACEMENT Bilateral    TKR  . SHOULDER SURGERY Left    shoulder replacement  . ULTRASOUND GUIDANCE FOR VASCULAR ACCESS  10/16/2017   Procedure: ULTRASOUND GUIDANCE FOR MICROPUNCTURE VENOUS ACCESS;  Surgeon: Algernon Huxley, MD;  Location: [facility] ORS;  Service: Vascular;;    Family History  Problem Relation Age of Onset  . CAD Mother   . Hypertension Mother   . Stroke Mother   . Kidney failure Mother   . Cancer Father   . Kidney failure Brother   . Kidney failure Son        on hemodialysis in [place] History:  reports that  has never smoked. she has never used smokeless tobacco. She reports that she does not drink alcohol or use drugs.  Allergies  Allergen Reactions  . Pregabalin Other (See Comments)    Sleepy  . Azithromycin Other (See Comments)  Unknown  . Cymbalta [Duloxetine Hcl] Other (See Comments)    Per MAR  . Erythromycin Hives    Medications: I have reviewed the patient's current medications.  ROS: Unable to obtain due to confusion   Physical Examination: Blood pressure 114/67, pulse 60, temperature 98.6 F (37 C), temperature source Oral, resp. rate (!) 23, height 5\' 2"  (1.575 m), weight (!) 308 lb 10.3 oz (140 kg), SpO2 100 %.    Neurological Examination   Mental Status: Alert to name only  Cranial Nerves: II: Discs flat bilaterally; Visual fields grossly normal, pupils equal, round, reactive to light and accommodation III,IV, VI: ptosis not present, extra-ocular motions intact bilaterally VIII: hearing normal bilaterally IX,X: gag reflex  present XII: midline tongue extension Motor: Generalized weakness  Tone and bulk:normal tone throughout; no atrophy noted Sensory: not tested Deep Tendon Reflexes: 1+ and symmetric throughout Plantars: Right: downgoing   Left: downgoing Cerebellar: Not tested  Gait: not tested      Laboratory Studies:   Basic Metabolic Panel: Recent Labs  Lab 11/10/17 1542 11/11/17 0235 11/11/17 0454 11/12/17 2207 11/13/17 0442  NA 143  --  142 138 135  K 6.3* 5.2* 5.7* 4.5 4.3  CL 109  --  110 100* 98*  CO2 23  --  24 27 30   GLUCOSE 91  --  78 109* 97  BUN 74*  --  70* 35* 40*  CREATININE 4.77*  --  4.55* 3.49* 3.43*  CALCIUM 7.9*  --  7.3* 7.5* 7.5*  PHOS  --   --   --   --  5.2*    Liver Function Tests: Recent Labs  Lab 11/12/17 1112 11/13/17 0442  AST 16  --   ALT 10*  --   ALKPHOS 66  --   BILITOT 0.5  --   PROT 6.0*  --   ALBUMIN 2.6* 2.4*   No results for input(s): LIPASE, AMYLASE in the last 168 hours. No results for input(s): AMMONIA in the last 168 hours.  CBC: Recent Labs  Lab 11/10/17 1542 11/11/17 0454 11/11/17 1100 11/12/17 2207 11/13/17 0442  WBC 5.8 5.1  --  5.7 6.0  NEUTROABS 3.6  --   --  3.2  --   HGB 6.8* 6.9* 7.6* 8.5* 8.4*  HCT 21.2* 21.6* 23.8* 25.8* 25.7*  MCV 98.6 96.0  --  94.0 95.1  PLT 171 136*  --  152 144*    Cardiac Enzymes: Recent Labs  Lab 11/10/17 1542 11/11/17 0454  TROPONINI <0.03 <0.03    BNP: Invalid input(s): POCBNP  CBG: Recent Labs  Lab 11/12/17 0819 11/12/17 1740 11/12/17 2100 11/12/17 2131 11/13/17 0748  GLUCAP 76 89 129* 111* 6    Microbiology: Results for orders placed or performed during the hospital encounter of 11/10/17  MRSA PCR Screening     Status: None   Collection Time: 11/10/17  9:41 PM  Result Value Ref Range Status   MRSA by PCR NEGATIVE NEGATIVE Final    Comment:        The GeneXpert MRSA Assay (FDA approved for NASAL specimens only), is one component of a comprehensive MRSA  colonization surveillance program. It is not intended to diagnose MRSA infection nor to guide or monitor treatment for MRSA infections. Performed at [facility], [address] [place], [place] [address]     Coagulation Studies: Recent Labs    11/11/17 0454  LABPROT 15.0  INR 1.19    Urinalysis: No results for input(s): COLORURINE,  LABSPEC, PHURINE, GLUCOSEU, HGBUR, BILIRUBINUR, KETONESUR, PROTEINUR, UROBILINOGEN, NITRITE, LEUKOCYTESUR in the last 168 hours.  Invalid input(s): APPERANCEUR  Lipid Panel:  No results found for: CHOL, TRIG, HDL, CHOLHDL, VLDL, LDLCALC  HgbA1C: No results found for: HGBA1C  Urine Drug Screen:  No results found for: LABOPIA, COCAINSCRNUR, LABBENZ, AMPHETMU, THCU, LABBARB  Alcohol Level: No results for input(s): ETH in the last 168 hours.   Imaging: No results found.   Assessment/Plan:  72 y.o. female with a known history of hypertension, CKD stage5,CAD status post stents, GERD, bilateral lower extremity edema, chronic iron deficiency anemia,seizure disorder presents to hospital secondary to worsening shortness of breath.Found to have acute on chronic ESRD. Pt has hx of seizures on depakote at home.  VPA level of 39. Transferred to ICU due to seizure activity. Unknown last seizure episode.    - No need for EEG as known hx of seizures - VPA load another 500 mg IV - Level check tomorrow - Con't VPA 500 TID and will adjust based on the level after HD 11/13/2017, 11:02 AM

## 2017-11-13 NOTE — Progress Notes (Signed)
Patient vitals stable post dialysis, patient more alert, sitting up eating lunch tray provide, denies any pain. O2 being weaned per MD as patient is normally on room air. Patient to take medications once she completes her lunch

## 2017-11-13 NOTE — Progress Notes (Signed)
Dialysis post assessment

## 2017-11-13 NOTE — Progress Notes (Signed)
Patient is in no distress.  At the time of my evaluation, was undergoing hemodialysis.  No further seizures.  I have requested neurology evaluation for management of seizures and adjustment of anticonvulsants.  I have ordered transfer to [facility] floor with cardiac monitoring.  After transfer, PCCM will sign off. Please call if we can be of further assistance    Merton Border, MD PCCM service Mobile [phone] Pager [phone] 11/13/2017 2:18 PM

## 2017-11-13 NOTE — Progress Notes (Signed)
[facility], [place] 11/13/17  Subjective:   Patient seen during dialysis.  Tolerating well. Today is her 3rd dialysis treatment Yesterday, 2000 cc was removed with dialysis Last evening she had a seizure therefore she was transferred to ICU for monitoring   HEMODIALYSIS FLOWSHEET:  Blood Flow Rate (mL/min): 300 mL/min Arterial Pressure (mmHg): -120 mmHg Venous Pressure (mmHg): 110 mmHg Transmembrane Pressure (mmHg): 70 mmHg Ultrafiltration Rate (mL/min): 670 mL/min Dialysate Flow Rate (mL/min): 600 ml/min Conductivity: Machine : 14 Conductivity: Machine : 14 Dialysis Fluid Bolus: Normal Saline Bolus Amount (mL): 200 mL    Objective:  Vital signs in last 24 hours:  Temp:  [97.8 F (36.6 C)-99.6 F (37.6 C)] 98.7 F (37.1 C) (01/17 1226) Pulse Rate:  [59-89] 82 (01/17 1400) Resp:  [13-25] 24 (01/17 1400) BP: (99-153)/(51-102) 129/102 (01/17 1400) SpO2:  [90 %-100 %] 100 % (01/17 1400) Weight:  [140 kg (308 lb 10.3 oz)-141 kg (310 lb 13.6 oz)] 140 kg (308 lb 10.3 oz) (01/17 0900)  Weight change: 1.931 kg (4 lb 4.1 oz) Filed Weights   11/12/17 1242 11/13/17 0210 11/13/17 0900  Weight: (!) 142.2 kg (313 lb 7.9 oz) (!) 141 kg (310 lb 13.6 oz) (!) 140 kg (308 lb 10.3 oz)    Intake/Output:    Intake/Output Summary (Last 24 hours) at 11/13/2017 1615 Last data filed at 11/13/2017 1337 Gross per 24 hour  Intake 717 ml  Output 2619 ml  Net -1902 ml     Physical Exam: General:  Chronically ill-appearing, mild distress  HEENT  moist oral mucous membranes  Neck  supple  Pulm/lungs  diffuse bilateral crackles oxygen by nasal cannula  CVS/Heart regular, no rub  Abdomen:   Soft, nontender   Extremities: 2+ pitting edema bilaterally  Neurologic:  Alert, able to follow commands  Skin:  Dry skin  Access:  Right arm developing brachiocephalic fistula, right femoral temporary dialysis catheter       Basic Metabolic Panel:  Recent Labs  Lab  11/10/17 1542 11/11/17 0235 11/11/17 0454 11/12/17 2207 11/13/17 0442  NA 143  --  142 138 135  K 6.3* 5.2* 5.7* 4.5 4.3  CL 109  --  110 100* 98*  CO2 23  --  24 27 30   GLUCOSE 91  --  78 109* 97  BUN 74*  --  70* 35* 40*  CREATININE 4.77*  --  4.55* 3.49* 3.43*  CALCIUM 7.9*  --  7.3* 7.5* 7.5*  PHOS  --   --   --   --  5.2*     CBC: Recent Labs  Lab 11/10/17 1542 11/11/17 0454 11/11/17 1100 11/12/17 2207 11/13/17 0442  WBC 5.8 5.1  --  5.7 6.0  NEUTROABS 3.6  --   --  3.2  --   HGB 6.8* 6.9* 7.6* 8.5* 8.4*  HCT 21.2* 21.6* 23.8* 25.8* 25.7*  MCV 98.6 96.0  --  94.0 95.1  PLT 171 136*  --  152 144*      Lab Results  Component Value Date   HEPBSAG Negative 11/11/2017   HEPBSAB Reactive 11/11/2017      Microbiology:  Recent Results (from the past 240 hour(s))  MRSA PCR Screening     Status: None   Collection Time: 11/10/17  9:41 PM  Result Value Ref Range Status   MRSA by PCR NEGATIVE NEGATIVE Final    Comment:        The GeneXpert MRSA Assay (FDA approved for NASAL  specimens only), is one component of a comprehensive MRSA colonization surveillance program. It is not intended to diagnose MRSA infection nor to guide or monitor treatment for MRSA infections. Performed at [facility], [address] [place], [place] [address]     Coagulation Studies: Recent Labs    11/11/17 0454  LABPROT 15.0  INR 1.19    Urinalysis: No results for input(s): COLORURINE, LABSPEC, PHURINE, GLUCOSEU, HGBUR, BILIRUBINUR, KETONESUR, PROTEINUR, UROBILINOGEN, NITRITE, LEUKOCYTESUR in the last 72 hours.  Invalid input(s): APPERANCEUR    Imaging: No results found.   Medications:   . albumin human     . atorvastatin  20 mg Oral QHS  . carvedilol  3.125 mg Oral BID  . cholecalciferol  1,000 Units Oral Daily  . febuxostat  40 mg Oral Daily  . heparin  5,000 Units Subcutaneous Q8H  . [START ON 11/14/2017] isosorbide mononitrate  60 mg Oral QPM  .  pantoprazole  40 mg Oral Daily  . sevelamer carbonate  800 mg Oral TID WC  . sodium chloride flush  10-40 mL Intracatheter Q12H  . torsemide  100 mg Oral Daily  . valproic acid  500 mg Oral TID   acetaminophen **OR** acetaminophen, ipratropium-albuterol, LORazepam, nitroGLYCERIN, [DISCONTINUED] ondansetron **OR** ondansetron (ZOFRAN) IV, oxyCODONE, senna-docusate, sodium chloride flush  Assessment/ Plan:  72 y.o. African-American female with severe hypertension, chronic kidney disease, obesity gout, was admitted on 11/10/2017 with acute shortness of breath and volume overload.   1.  ESRD   2.  Acute pulmonary edema 3.  Anemia of chronic kidney disease 4.  Lower extremity edema 5.  Hyperkalemia  Dialysis was initiated on January 15 Today is her third dialysis treatment and she is tolerating well Potassium level is  now normal We will continue to remove fluid with dialysis as tolerated  Prior to discharge, patient will need an IJ tunneled dialysis catheter  she will also need to be able to sit in a chair for dialysis   Blood transfusion is planned for anemia.   Hold Epogen for now due to recent seizures  D/C planning is in progress PPD was placed on 1/15 Hep B studies (core Ab + and Surface Ab POS) Antigen is NEG Patient pathways is coordinating care for outpatient dialysis center       LOS: [place] 1/17/20194:15 [place], [place]

## 2017-11-13 NOTE — Progress Notes (Signed)
Pre-Dialysis Assessment. 

## 2017-11-13 NOTE — Progress Notes (Signed)
Pre dialysis assessment 

## 2017-11-14 ENCOUNTER — Encounter
Admission: EM | Disposition: A | Discharge status: Skilled Nursing Facility | Payer: Self-pay | Source: Home / Self Care | Attending: Family Medicine

## 2017-11-14 DIAGNOSIS — R569 Unspecified convulsions: Secondary | ICD-10-CM

## 2017-11-14 LAB — CBC
HEMATOCRIT: 24.8 % — AB (ref 35.0–47.0)
HEMOGLOBIN: 8.1 g/dL — AB (ref 12.0–16.0)
MCH: 31.1 pg (ref 26.0–34.0)
MCHC: 32.9 g/dL (ref 32.0–36.0)
MCV: 94.5 fL (ref 80.0–100.0)
Platelets: 141 10*3/uL — ABNORMAL LOW (ref 150–440)
RBC: 2.62 MIL/uL — ABNORMAL LOW (ref 3.80–5.20)
RDW: 15.9 % — ABNORMAL HIGH (ref 11.5–14.5)
WBC: 6.2 10*3/uL (ref 3.6–11.0)

## 2017-11-14 LAB — BASIC METABOLIC PANEL
ANION GAP: 7 (ref 5–15)
BUN: 27 mg/dL — AB (ref 6–20)
CHLORIDE: 98 mmol/L — AB (ref 101–111)
CO2: 32 mmol/L (ref 22–32)
Calcium: 7.8 mg/dL — ABNORMAL LOW (ref 8.9–10.3)
Creatinine, Ser: 3.13 mg/dL — ABNORMAL HIGH (ref 0.44–1.00)
GFR calc Af Amer: 16 mL/min — ABNORMAL LOW (ref >60)
GFR calc non Af Amer: 14 mL/min — ABNORMAL LOW (ref >60)
GLUCOSE: 92 mg/dL (ref 65–99)
Potassium: 4.1 mmol/L (ref 3.5–5.1)
Sodium: 137 mmol/L (ref 135–145)

## 2017-11-14 LAB — TYPE AND SCREEN
ABO/RH(D): O POS
ANTIBODY SCREEN: NEGATIVE
UNIT DIVISION: 0
Unit division: 0

## 2017-11-14 LAB — GLUCOSE, CAPILLARY
GLUCOSE-CAPILLARY: 126 mg/dL — AB (ref 65–99)
GLUCOSE-CAPILLARY: 101 mg/dL — AB (ref 65–99)
Glucose-Capillary: 73 mg/dL (ref 65–99)
Glucose-Capillary: 85 mg/dL (ref 65–99)

## 2017-11-14 LAB — BPAM RBC
Blood Product Expiration Date: 201901262359
Blood Product Expiration Date: 201902022359
ISSUE DATE / TIME: 201901150300
ISSUE DATE / TIME: 201901161526
UNIT TYPE AND RH: 5100
Unit Type and Rh: 9500

## 2017-11-14 LAB — VALPROIC ACID LEVEL: Valproic Acid Lvl: 54 ug/mL (ref 50.0–100.0)

## 2017-11-14 SURGERY — DIALYSIS/PERMA CATHETER INSERTION
Anesthesia: Moderate Sedation

## 2017-11-14 MED ORDER — VALPROIC ACID 250 MG PO CAPS
500.0000 mg | ORAL_CAPSULE | ORAL | Status: DC
  Administered 2017-11-15: 500 mg via ORAL
  Filled 2017-11-14: qty 2

## 2017-11-14 NOTE — Progress Notes (Signed)
[place] [facility] [place] NAME: Sarah Hamilton    MR#:  [id number]  DATE OF BIRTH:  30-Jul-1946  SUBJECTIVE:  CHIEF COMPLAINT:   Chief Complaint  Patient presents with  . Shortness of Breath  Patient without complaint, no events overnight per nursing staff, no further seizures  REVIEW OF SYSTEMS:  CONSTITUTIONAL: No fever, fatigue or weakness.  EYES: No blurred or double vision.  EARS, NOSE, AND THROAT: No tinnitus or ear pain.  RESPIRATORY: No cough, shortness of breath, wheezing or hemoptysis.  CARDIOVASCULAR: No chest pain, orthopnea, edema.  GASTROINTESTINAL: No nausea, vomiting, diarrhea or abdominal pain.  GENITOURINARY: No dysuria, hematuria.  ENDOCRINE: No polyuria, nocturia,  HEMATOLOGY: No anemia, easy bruising or bleeding SKIN: No rash or lesion. MUSCULOSKELETAL: No joint pain or arthritis.   NEUROLOGIC: No tingling, numbness, weakness.  PSYCHIATRY: No anxiety or depression.   ROS  DRUG ALLERGIES:   Allergies  Allergen Reactions  . Pregabalin Other (See Comments)    Sleepy  . Azithromycin Other (See Comments)    Unknown  . Cymbalta [Duloxetine Hcl] Other (See Comments)    Per MAR  . Erythromycin Hives    VITALS:  Blood pressure 109/73, pulse 76, temperature 98.6 F (37 C), temperature source Oral, resp. rate (!) 26, height 5\' 2"  (1.575 m), weight 134.7 kg (296 lb 15.4 oz), SpO2 98 %.  PHYSICAL EXAMINATION:  GENERAL:  72 y.o.-year-old patient lying in the bed with no acute distress.  EYES: Pupils equal, round, reactive to light and accommodation. No scleral icterus. Extraocular muscles intact.  HEENT: Head atraumatic, normocephalic. Oropharynx and nasopharynx clear.  NECK:  Supple, no jugular venous distention. No thyroid enlargement, no tenderness.  LUNGS: Normal breath sounds bilaterally, no wheezing, rales,rhonchi or crepitation. No use of accessory muscles of respiration.  CARDIOVASCULAR: S1, S2 normal. No  murmurs, rubs, or gallops.  ABDOMEN: Soft, nontender, nondistended. Bowel sounds present. No organomegaly or mass.  EXTREMITIES: No pedal edema, cyanosis, or clubbing.  NEUROLOGIC: Cranial nerves II through XII are intact. Muscle strength 5/5 in all extremities. Sensation intact. Gait not checked.  PSYCHIATRIC: The patient is alert and oriented x 3.  SKIN: No obvious rash, lesion, or ulcer.   Physical Exam LABORATORY PANEL:   CBC Recent Labs  Lab 11/14/17 0528  WBC 6.2  HGB 8.1*  HCT 24.8*  PLT 141*   ------------------------------------------------------------------------------------------------------------------  Chemistries  Recent Labs  Lab 11/12/17 1112  11/14/17 0528  NA  --    < > 137  K  --    < > 4.1  CL  --    < > 98*  CO2  --    < > 32  GLUCOSE  --    < > 92  BUN  --    < > 27*  CREATININE  --    < > 3.13*  CALCIUM  --    < > 7.8*  AST 16  --   --   ALT 10*  --   --   ALKPHOS 66  --   --   BILITOT 0.5  --   --    < > = values in this interval not displayed.   ------------------------------------------------------------------------------------------------------------------  Cardiac Enzymes Recent Labs  Lab 11/10/17 1542 11/11/17 0454  TROPONINI <0.03 <0.03   ------------------------------------------------------------------------------------------------------------------  RADIOLOGY:  No results found.  ASSESSMENT AND PLAN:  [name] a71 y.o.femalewith a known history of hypertension, CKD stage5,CAD status post stents, GERD, bilateral lower extremity edema,  chronic iron deficiency anemia,seizure disorder presents to hospital secondary to worsening shortness of breath.  1acute end-stage renal disease For IJ tunnel dialysis catheter placement later today Nephrology following for hemodialysis needs-planning for hemodialysis on tomorrow, [place]  2acute hyperkalemia Secondary to  above Resolved withcalcium gluconate, kayexalate,IV dextrose/insulin, and hemodialysis  3acute hypoxic respiratory failure Secondary to acute fluid overload state from newly diagnosed end-stage renal disease Resolved with hemodialysis, did require 1 night in the ICU on BiPAP which was successfully weaned off  4acute on chronic anemia most likely secondary to end-stage renal disease  Status post blood transfusion while in house and did receive Epogen  5chronic GERD without esophagitis  PPI daily  6 acute on chronic seizure disorder, unspecified Patient transferred from floor to ICU for 1 night, seen by neurology -Depakote level repeat 29 after HD another 500 VPA given after HD, to continue Depakote 500 TID and 500 post each HD, continue seizure precautions, as needed Ativan for seizures  All the records are reviewed and case discussed with Care Management/Social Workerr. Management plans discussed with the patient, family and they are in agreement.  CODE STATUS: full  TOTAL TIME TAKING CARE OF THIS PATIENT: 35 minutes.     POSSIBLE D/C IN 3-5 DAYS, DEPENDING ON CLINICAL CONDITION.      [name] M.D on 11/14/2017   Between 7am to 6pm - Pager - [phone]  After 6pm go to [email or web address] - password [email or web address] [facility]  Office  [phone]  CC: Primary care physician; System, Pcp Not In  Note: This dictation was prepared with Dragon dictation along with smaller phrase technology. Any transcriptional errors that result from this process are unintentional.

## 2017-11-14 NOTE — Care Management (Signed)
PPD note is in; [place] with [facility] updated. Patient has [place] chair with [facility] [place] MWF. 10AM.

## 2017-11-14 NOTE — NC FL2 (Signed)
[place] LEVEL OF CARE SCREENING TOOL     IDENTIFICATION  Patient Name: Sarah Hamilton Birthdate: 02-14-1946 Sex: female Admission Date (Current Location): 11/10/2017  [place] and [place] Number:  Engineering geologist and Address:  [facility], [address], [place], [place] [address]      Provider Number: [id number]  Attending Physician Name and Address:  Gorden Harms, MD  Relative Name and Phone Number:       Current Level of Care: Hospital Recommended Level of Care: [place] Prior Approval Number:    Date Approved/Denied:   PASRR Number: [id number]  Discharge Plan: SNF    Current Diagnoses: Patient Active Problem List   Diagnosis Date Noted  . Acute pulmonary edema ([place]) 11/10/2017  . Chronic kidney disease (CKD), stage IV (severe) ([facility]) 04/22/2017  . Syncope 01/04/2016  . Hyperkalemia 01/04/2016  . HTN (hypertension) 01/04/2016  . GERD (gastroesophageal reflux disease) 01/04/2016  . CAD (coronary artery disease) 01/04/2016  . HLD (hyperlipidemia) 01/04/2016    Orientation RESPIRATION BLADDER Height & Weight     Self, Place  O2(5 liters) Incontinent Weight: 296 lb 15.4 oz (134.7 kg) Height:  5\' 2"  (157.5 cm)  BEHAVIORAL SYMPTOMS/MOOD NEUROLOGICAL BOWEL NUTRITION STATUS  (none) Convulsions/Seizures Incontinent Diet(renal with fluid restriction)  AMBULATORY STATUS COMMUNICATION OF NEEDS Skin   Total Care Verbally Normal                       Personal Care Assistance Level of Assistance  Total care       Total Care Assistance: Maximum assistance   Functional Limitations Info  (no reported issues)          SPECIAL CARE FACTORS FREQUENCY  PT (By licensed PT)                    Contractures Contractures Info: Not present    Additional Factors Info  Code Status(outpatient dialysis) Code Status Info: full             Current Medications (11/14/2017):  This is the  current hospital active medication list Current Facility-Administered Medications  Medication Dose Route Frequency Provider Last Rate Last Dose  . acetaminophen (TYLENOL) tablet 650 mg  650 mg Oral Q6H PRN Gladstone Lighter, MD       Or  . acetaminophen (TYLENOL) suppository 650 mg  650 mg Rectal Q6H PRN Gladstone Lighter, MD      . atorvastatin (LIPITOR) tablet 20 mg  20 mg Oral QHS Gladstone Lighter, MD   20 mg at 11/13/17 2219  . carvedilol (COREG) tablet 3.125 mg  3.125 mg Oral BID Murlean Iba, MD   3.125 mg at 11/14/17 0950  . cholecalciferol (VITAMIN D) tablet 1,000 Units  1,000 Units Oral Daily Gladstone Lighter, MD   1,000 Units at 11/14/17 0950  . febuxostat (ULORIC) tablet 40 mg  40 mg Oral Daily Salary, Montell D, MD   40 mg at 11/14/17 0950  . heparin injection 5,000 Units  5,000 Units Subcutaneous Q8H Gladstone Lighter, MD   5,000 Units at 11/14/17 1401  . ipratropium-albuterol (DUONEB) 0.5-2.5 (3) MG/3ML nebulizer solution 3 mL  3 mL Nebulization Q4H PRN Wilhelmina Mcardle, MD      . isosorbide mononitrate (IMDUR) 24 hr tablet 60 mg  60 mg Oral QPM Tukov, Magadalene S, NP      . LORazepam (ATIVAN) injection 1 mg  1 mg Intravenous Q4H PRN Awilda Bill, NP      .  nitroGLYCERIN (NITROSTAT) SL tablet 0.4 mg  0.4 mg Sublingual Q5 min PRN Gladstone Lighter, MD      . ondansetron [facility]) injection 4 mg  4 mg Intravenous Q6H PRN Gladstone Lighter, MD   4 mg at 11/11/17 0104  . oxyCODONE (Oxy IR/ROXICODONE) immediate release tablet 5-10 mg  5-10 mg Oral Q6H PRN Gladstone Lighter, MD   5 mg at 11/13/17 2247  . pantoprazole (PROTONIX) EC tablet 40 mg  40 mg Oral Daily Gladstone Lighter, MD   40 mg at 11/14/17 0951  . senna-docusate (Senokot-S) tablet 2 tablet  2 tablet Oral Daily PRN Gladstone Lighter, MD      . sevelamer carbonate (RENVELA) tablet 800 mg  800 mg Oral TID WC Gladstone Lighter, MD   800 mg at 11/14/17 1400  . sodium chloride flush (NS) 0.9 % injection 10-40 mL   10-40 mL Intracatheter Q12H Gladstone Lighter, MD   10 mL at 11/14/17 0951  . sodium chloride flush (NS) 0.9 % injection 10-40 mL  10-40 mL Intracatheter PRN Gladstone Lighter, MD      . torsemide (DEMADEX) tablet 100 mg  100 mg Oral Daily Murlean Iba, MD   100 mg at 11/14/17 0950  . valproic acid (DEPAKENE) 250 MG capsule 500 mg  500 mg Oral TID Loney Hering D, MD   500 mg at 11/14/17 0949  . [START ON 11/15/2017] valproic acid (DEPAKENE) 250 MG capsule 500 mg  500 mg Oral Q T,Th,Sat-1800 Salary, Montell D, MD         Discharge Medications: Please see discharge summary for a list of discharge medications.  Relevant Imaging Results:  Relevant Lab Results:   Additional Information ss: [id number]  Shela Leff, LCSW

## 2017-11-14 NOTE — Consult Note (Signed)
Much more awake today and is s/p HD.  No seizures overnight.     Past Medical History:  Diagnosis Date  . Anemia   . Anginal pain ([place])   . Arthritis   . CHF (congestive heart failure) ([place])   . CKD (chronic kidney disease), stage IV ([place])   . Coronary artery disease   . Dementia   . Diverticulitis   . Diverticulosis   . Dysphagia   . Dyspnea   . Edema    FEET/LEGS  . GERD (gastroesophageal reflux disease)   . Gout   . Heart murmur   . Hepatitis    B  . Hyperlipidemia   . Hyperparathyroidism ([place])   . Hypertension   . Hypothyroidism   . Orthopnea   . Pain    CHRONIC PAIN SYNDROME  . Palpitations   . Seizures ([place])    last seizure end of November 2018  . Spinal stenosis   . Tremor    ESSENTIAL  . Wheezing     Past Surgical History:  Procedure Laterality Date  . ABDOMINAL HYSTERECTOMY    . AV FISTULA PLACEMENT Right 10/16/2017   Procedure: ARTERIOVENOUS (AV) FISTULA CREATION (BRACHIOCEPHALIC);  Surgeon: Algernon Huxley, MD;  Location: [facility] ORS;  Service: Vascular;  Laterality: Right;  . CATARACT EXTRACTION W/PHACO Left 11/07/2016   Procedure: CATARACT EXTRACTION PHACO AND INTRAOCULAR LENS PLACEMENT (IOC);  Surgeon: Eulogio Bear, MD;  Location: [facility] ORS;  Service: Ophthalmology;  Laterality: Left;  Lot # [id number] H [place]: 00:38.0 AP%: 10.1 CDE: 3.86  . CATARACT EXTRACTION W/PHACO Right 01/09/2017   Procedure: CATARACT EXTRACTION PHACO AND INTRAOCULAR LENS PLACEMENT (IOC);  Surgeon: Eulogio Bear, MD;  Location: [facility] ORS;  Service: Ophthalmology;  Laterality: Right;  [place] 54.8 AP% 5.4 CDE 2.96 Fluid pack lot # [id number]  . CHEST SURGERY     BENIGN TUMOR  . CHOLECYSTECTOMY    . CORONARY ANGIOPLASTY     STENT  . DIALYSIS/PERMA CATHETER INSERTION N/A 11/11/2017   Procedure: DIALYSIS/PERMA CATHETER INSERTION;  Surgeon: Katha Cabal, MD;  Location: [facility];  Service: Cardiovascular;  Laterality: N/A;  . EYE SURGERY    . JOINT REPLACEMENT Bilateral     TKR  . SHOULDER SURGERY Left    shoulder replacement  . ULTRASOUND GUIDANCE FOR VASCULAR ACCESS  10/16/2017   Procedure: ULTRASOUND GUIDANCE FOR MICROPUNCTURE VENOUS ACCESS;  Surgeon: Algernon Huxley, MD;  Location: [facility] ORS;  Service: Vascular;;    Family History  Problem Relation Age of Onset  . CAD Mother   . Hypertension Mother   . Stroke Mother   . Kidney failure Mother   . Cancer Father   . Kidney failure Brother   . Kidney failure Son        on hemodialysis in [place] History:  reports that  has never smoked. she has never used smokeless tobacco. She reports that she does not drink alcohol or use drugs.  Allergies  Allergen Reactions  . Pregabalin Other (See Comments)    Sleepy  . Azithromycin Other (See Comments)    Unknown  . Cymbalta [Duloxetine Hcl] Other (See Comments)    Per MAR  . Erythromycin Hives    Medications: I have reviewed the patient's current medications.  ROS: Unable to obtain due to confusion   Physical Examination: Blood pressure 125/68, pulse 72, temperature 98.7 F (37.1 C), resp. rate (!) 23, height 5\' 2"  (1.575 m), weight 296 lb 15.4 oz (134.7  kg), SpO2 92 %.    Neurological Examination   Mental Status: Alert to name place and location  Cranial Nerves: II: Discs flat bilaterally; Visual fields grossly normal, pupils equal, round, reactive to light and accommodation III,IV, VI: ptosis not present, extra-ocular motions intact bilaterally VIII: hearing normal bilaterally IX,X: gag reflex present XII: midline tongue extension Motor: Generalized weakness  Tone and bulk:normal tone throughout; no atrophy noted Sensory: not tested Deep Tendon Reflexes: 1+ and symmetric throughout Plantars: Right: downgoing   Left: downgoing Cerebellar: Not tested  Gait: not tested      Laboratory Studies:   Basic Metabolic Panel: Recent Labs  Lab 11/10/17 1542 11/11/17 0235 11/11/17 0454 11/12/17 2207 11/13/17 0442  11/14/17 0528  NA 143  --  142 138 135 137  K 6.3* 5.2* 5.7* 4.5 4.3 4.1  CL 109  --  110 100* 98* 98*  CO2 23  --  24 27 30  32  GLUCOSE 91  --  78 109* 97 92  BUN 74*  --  70* 35* 40* 27*  CREATININE 4.77*  --  4.55* 3.49* 3.43* 3.13*  CALCIUM 7.9*  --  7.3* 7.5* 7.5* 7.8*  PHOS  --   --   --   --  5.2*  --     Liver Function Tests: Recent Labs  Lab 11/12/17 1112 11/13/17 0442  AST 16  --   ALT 10*  --   ALKPHOS 66  --   BILITOT 0.5  --   PROT 6.0*  --   ALBUMIN 2.6* 2.4*   No results for input(s): LIPASE, AMYLASE in the last 168 hours. No results for input(s): AMMONIA in the last 168 hours.  CBC: Recent Labs  Lab 11/10/17 1542 11/11/17 0454 11/11/17 1100 11/12/17 2207 11/13/17 0442 11/14/17 0528  WBC 5.8 5.1  --  5.7 6.0 6.2  NEUTROABS 3.6  --   --  3.2  --   --   HGB 6.8* 6.9* 7.6* 8.5* 8.4* 8.1*  HCT 21.2* 21.6* 23.8* 25.8* 25.7* 24.8*  MCV 98.6 96.0  --  94.0 95.1 94.5  PLT 171 136*  --  152 144* 141*    Cardiac Enzymes: Recent Labs  Lab 11/10/17 1542 11/11/17 0454  TROPONINI <0.03 <0.03    BNP: Invalid input(s): POCBNP  CBG: Recent Labs  Lab 11/13/17 0748 11/13/17 1119 11/13/17 1640 11/13/17 2117 11/14/17 0748  GLUCAP 82 79 119* 113* 15    Microbiology: Results for orders placed or performed during the hospital encounter of 11/10/17  MRSA PCR Screening     Status: None   Collection Time: 11/10/17  9:41 PM  Result Value Ref Range Status   MRSA by PCR NEGATIVE NEGATIVE Final    Comment:        The GeneXpert MRSA Assay (FDA approved for NASAL specimens only), is one component of a comprehensive MRSA colonization surveillance program. It is not intended to diagnose MRSA infection nor to guide or monitor treatment for MRSA infections. Performed at [facility], [address] [place], [place] [address]     Coagulation Studies: No results for input(s): LABPROT, INR in the last 72 hours.  Urinalysis: No results for  input(s): COLORURINE, LABSPEC, PHURINE, GLUCOSEU, HGBUR, BILIRUBINUR, KETONESUR, PROTEINUR, UROBILINOGEN, NITRITE, LEUKOCYTESUR in the last 168 hours.  Invalid input(s): APPERANCEUR  Lipid Panel:  No results found for: CHOL, TRIG, HDL, CHOLHDL, VLDL, LDLCALC  HgbA1C: No results found for: HGBA1C  Urine Drug Screen:  No results found for:  LABOPIA, COCAINSCRNUR, LABBENZ, AMPHETMU, THCU, LABBARB  Alcohol Level: No results for input(s): ETH in the last 168 hours.   Imaging: No results found.   Assessment/Plan:  72 y.o. female with a known history of hypertension, CKD stage5,CAD status post stents, GERD, bilateral lower extremity edema, chronic iron deficiency anemia,seizure disorder presents to hospital secondary to worsening shortness of breath.Found to have acute on chronic ESRD. Pt has hx of seizures on depakote at home.   - VPA level repeat 29 after HD another 500 VPA given after HD - con't VPA 500 TID and 500 post each HD - call with questions.   11/14/2017, 11:32 AM

## 2017-11-14 NOTE — Evaluation (Signed)
Physical Therapy Evaluation Patient Details Name: Casady Voshell MRN: [id number] DOB: 1945-12-25 Today's Date: 11/14/2017   History of Present Illness  72 y.o. female with a known history of hypertension, CK D stage 5, CAD status post stents, GERD, bilateral lower extremity edema, chronic iron deficiency anemia, seizure disorder presents to hospital secondary to worsening shortness of breath.  Clinical Impression  Pt very weak and limited with PT exam, but was able to do ~10 minutes of bed exercises with frequent rest breaks.  She fatigued quickly and had R knee pain with nearly all movements.  Pt with R temporary femoral catheter do mobility deferred; regardless pt was very weak, very limited and will require further PT here and at [facility] on d/c.    Follow Up Recommendations SNF    Equipment Recommendations       Recommendations for Other Services       Precautions / Restrictions Precautions Precautions: Fall Restrictions Weight Bearing Restrictions: No      Mobility  Bed Mobility               General bed mobility comments: deferred mobility secondary to temporary femoral catheter  Transfers                    Ambulation/Gait                Stairs            Wheelchair Mobility    Modified Rankin (Stroke Patients Only)       Balance Overall balance assessment: (supine exercises only)                                           Pertinent Vitals/Pain Pain Assessment: (c/o general pain, 8/10 pain in R knee with any movement)    Home Living Family/patient expects to be discharged to:: Private residence Living Arrangements: Other relatives Available Help at Discharge: Family(brothers) Type of Home: [place] Access: Ramped entrance       Home Equipment: Environmental consultant - 4 wheels      Prior Function Level of Independence: Independent with assistive device(s)         Comments: Pt reports that she is  minimally active, but able to go shopping with aide, etc     Hand Dominance        Extremity/Trunk Assessment   Upper Extremity Assessment Upper Extremity Assessment: Generalized weakness    Lower Extremity Assessment Lower Extremity Assessment: Generalized weakness       Communication   Communication: No difficulties  Cognition Arousal/Alertness: Awake/alert Behavior During Therapy: WFL for tasks assessed/performed Overall Cognitive Status: Within Functional Limits for tasks assessed                                        General Comments      Exercises General Exercises - Lower Extremity Ankle Circles/Pumps: Strengthening;10 reps;AROM(resisted DF) Quad Sets: Strengthening;10 reps Short Arc Quad: Left;AROM;10 reps Heel Slides: AROM;AAROM;10 reps(pain/AAROM on R) Hip ABduction/ADduction: Strengthening;10 reps;AAROM(AAROM on R) Straight Leg Raises: AAROM;5 reps;Left   Assessment/Plan    PT Assessment Patient needs continued PT services  PT Problem List Decreased strength;Decreased range of motion;Decreased activity tolerance;Decreased balance;Decreased mobility;Decreased coordination;Decreased cognition;Decreased knowledge of use of DME;Decreased safety awareness;Pain;Cardiopulmonary status limiting activity  PT Treatment Interventions DME instruction;Gait training;Functional mobility training;Therapeutic activities;Balance training;Therapeutic exercise;Patient/family education    PT Goals (Current goals can be found in the Care Plan section)  Acute Rehab PT Goals Patient Stated Goal: get stronger PT Goal Formulation: With patient Time For Goal Achievement: 11/28/17 Potential to Achieve Goals: Fair    Frequency Min 2X/week   Barriers to discharge        Co-evaluation               AM-PAC PT "6 Clicks" Daily Activity  Outcome Measure Difficulty turning over in bed (including adjusting bedclothes, sheets and blankets)?:  Unable Difficulty moving from lying on back to sitting on the side of the bed? : Unable Difficulty sitting down on and standing up from a chair with arms (e.g., wheelchair, bedside commode, etc,.)?: Unable Help needed moving to and from a bed to chair (including a wheelchair)?: Total Help needed walking in hospital room?: Total Help needed climbing 3-5 steps with a railing? : Total 6 Click Score: 6    End of Session   Activity Tolerance: Patient limited by pain(deferred mobility secondary to temp fem cath) Patient left: with bed alarm set;with call bell/phone within reach   PT Visit Diagnosis: Muscle weakness (generalized) (M62.81);Difficulty in walking, not elsewhere classified (R26.2)    Time: 5697-9480 PT Time Calculation (min) (ACUTE ONLY): 38 min   Charges:   PT Evaluation $PT Eval Low Complexity: 1 Low PT Treatments $Therapeutic Exercise: 8-22 mins   PT G Codes:        Kreg Shropshire, DPT 11/14/2017, 3:39 PM

## 2017-11-14 NOTE — Progress Notes (Signed)
Patient arrived from [facility].  Eating supper

## 2017-11-14 NOTE — Clinical Social Work Note (Signed)
Clinical Social Work Assessment  Patient Details  Name: Sarah Hamilton MRN: [id number] Date of Birth: 27-Nov-1945  Date of referral:  11/14/17               Reason for consult:  Facility Placement                Permission sought to share information with:    Permission granted to share information::     Name::        Agency::     Relationship::     Contact Information:     Housing/Transportation Living arrangements for the past 2 months:  Single Family Home Source of Information:  Other (Comment Required)([facility]) Patient Interpreter Needed:  None Criminal Activity/Legal Involvement Pertinent to Current Situation/Hospitalization:  No - Comment as needed Significant Relationships:    Lives with:    Do you feel safe going back to the place where you live?    Need for family participation in patient care:     Care giving concerns:  Patient resides with a son according to [facility].   Social Worker assessment / plan:  CSW has spoken to [place] at [employer] and she confirmed to [place] that they are having patient go to [facility] at discharge. CSW contacted Broadus John at [facility] and he is aware and has the referral. Broadus John stated that [facility] will be transporting patient to and from dialysis as an outpatient. Unknown by CSW at this time if patient has an outpatient dialysis schedule. Patient has now moved out of ICU to the medical floor.   Employment status:    Insurance information:    PT Recommendations:    Information / Referral to community resources:     Patient/Family's Response to care:  [facility] discussing with family their discharge plan for patient and CSW is facilitating that plan when time.   Patient/Family's Understanding of and Emotional Response to Diagnosis, Current Treatment, and Prognosis:  Unknown at this time as [facility] is working with family to make these arrangements.  Emotional Assessment Appearance:  Appears stated age Attitude/Demeanor/Rapport:    Affect (typically observed):     Orientation:  Fluctuating Orientation (Suspected and/or reported Sundowners) Alcohol / Substance use:  Not Applicable Psych involvement (Current and /or in the community):  No (Comment)  Discharge Needs  Concerns to be addressed:  Care Coordination Readmission within the last 30 days:  No Current discharge risk:  None Barriers to Discharge:  No Barriers Identified   Shela Leff, LCSW 11/14/2017, 4:28 PM

## 2017-11-14 NOTE — Progress Notes (Signed)
Patient is floor status level of care.  Not seen officially today by [facility] team.  We are available as needed.  Merton Border, MD PCCM service Mobile [phone] Pager [phone] 11/14/2017 12:57 PM

## 2017-11-14 NOTE — Progress Notes (Signed)
[facility], [place] 11/14/17  Subjective:   Patient much better today She is currently on room air She has tolerated her dialysis treatments well Another 1800 cc of fluid was removed She still has edema but states that is back to baseline   Objective:  Vital signs in last 24 hours:  Temp:  [98.7 F (37.1 C)-99.8 F (37.7 C)] 98.7 F (37.1 C) (01/18 0700) Pulse Rate:  [59-86] 72 (01/18 0900) Resp:  [13-28] 23 (01/18 0900) BP: (78-182)/(50-152) 125/68 (01/18 0900) SpO2:  [90 %-100 %] 92 % (01/18 0900) Weight:  [134.7 kg (296 lb 15.4 oz)] 134.7 kg (296 lb 15.4 oz) (01/18 0424)  Weight change: -3 kg (-9.8 oz) Filed Weights   11/13/17 0210 11/13/17 0900 11/14/17 0424  Weight: (!) 141 kg (310 lb 13.6 oz) (!) 140 kg (308 lb 10.3 oz) 134.7 kg (296 lb 15.4 oz)    Intake/Output:    Intake/Output Summary (Last 24 hours) at 11/14/2017 0928 Last data filed at 11/14/2017 0316 Gross per 24 hour  Intake 200 ml  Output 2919 ml  Net -2719 ml     Physical Exam: General:  Chronically ill-appearing, mild distress  HEENT  moist oral mucous membranes  Neck  supple  Pulm/lungs  room air,   CVS/Heart  regular, no rub  Abdomen:   Soft, nontender   Extremities:  2+ pitting edema bilaterally  Neurologic:  Alert, able to follow commands  Skin:  Dry skin  Access:  Right arm developing brachiocephalic fistula, right femoral temporary dialysis catheter       Basic Metabolic Panel:  Recent Labs  Lab 11/10/17 1542 11/11/17 0235 11/11/17 0454 11/12/17 2207 11/13/17 0442 11/14/17 0528  NA 143  --  142 138 135 137  K 6.3* 5.2* 5.7* 4.5 4.3 4.1  CL 109  --  110 100* 98* 98*  CO2 23  --  24 27 30  32  GLUCOSE 91  --  78 109* 97 92  BUN 74*  --  70* 35* 40* 27*  CREATININE 4.77*  --  4.55* 3.49* 3.43* 3.13*  CALCIUM 7.9*  --  7.3* 7.5* 7.5* 7.8*  PHOS  --   --   --   --  5.2*  --      CBC: Recent Labs  Lab 11/10/17 1542 11/11/17 0454 11/11/17 1100  11/12/17 2207 11/13/17 0442 11/14/17 0528  WBC 5.8 5.1  --  5.7 6.0 6.2  NEUTROABS 3.6  --   --  3.2  --   --   HGB 6.8* 6.9* 7.6* 8.5* 8.4* 8.1*  HCT 21.2* 21.6* 23.8* 25.8* 25.7* 24.8*  MCV 98.6 96.0  --  94.0 95.1 94.5  PLT 171 136*  --  152 144* 141*      Lab Results  Component Value Date   HEPBSAG Negative 11/11/2017   HEPBSAB Reactive 11/11/2017      Microbiology:  Recent Results (from the past 240 hour(s))  MRSA PCR Screening     Status: None   Collection Time: 11/10/17  9:41 PM  Result Value Ref Range Status   MRSA by PCR NEGATIVE NEGATIVE Final    Comment:        The GeneXpert MRSA Assay (FDA approved for NASAL specimens only), is one component of a comprehensive MRSA colonization surveillance program. It is not intended to diagnose MRSA infection nor to guide or monitor treatment for MRSA infections. Performed at [facility], [address] [place], [place] [address]  Coagulation Studies: No results for input(s): LABPROT, INR in the last 72 hours.  Urinalysis: No results for input(s): COLORURINE, LABSPEC, PHURINE, GLUCOSEU, HGBUR, BILIRUBINUR, KETONESUR, PROTEINUR, UROBILINOGEN, NITRITE, LEUKOCYTESUR in the last 72 hours.  Invalid input(s): APPERANCEUR    Imaging: No results found.   Medications:   . albumin human     . atorvastatin  20 mg Oral QHS  . carvedilol  3.125 mg Oral BID  . cholecalciferol  1,000 Units Oral Daily  . febuxostat  40 mg Oral Daily  . heparin  5,000 Units Subcutaneous Q8H  . isosorbide mononitrate  60 mg Oral QPM  . pantoprazole  40 mg Oral Daily  . sevelamer carbonate  800 mg Oral TID WC  . sodium chloride flush  10-40 mL Intracatheter Q12H  . torsemide  100 mg Oral Daily  . valproic acid  500 mg Oral TID   acetaminophen **OR** acetaminophen, ipratropium-albuterol, LORazepam, nitroGLYCERIN, [DISCONTINUED] ondansetron **OR** ondansetron (ZOFRAN) IV, oxyCODONE, senna-docusate, sodium chloride  flush  Assessment/ Plan:  71 y.o. African-American female with severe hypertension, chronic kidney disease, obesity gout, was admitted on 11/10/2017 with acute shortness of breath and volume overload.   1.  ESRD   2.  Acute pulmonary edema 3.  Anemia of chronic kidney disease 4.  Lower extremity edema 5.  Hyperkalemia  Dialysis was initiated on January 15 She has had tree dialysis treatments which has resulted in significant clinical improvement Potassium level is now normal   IJ tunneled dialysis catheter is planned for later today Plan to get her to sit in a chair for dialysis tomorrow   Blood transfusion for anemia.   Hold Epogen for now due to recent seizures  D/C planning is in progress PPD was placed on 1/15 Hep B studies (core Ab + and Surface Ab POS) Antigen is NEG [facility]       LOS: [place] 1/18/20199:28 AM  [facility] [place], [place]

## 2017-11-14 NOTE — Progress Notes (Addendum)
TB skin test read- results negative.

## 2017-11-14 NOTE — Progress Notes (Signed)
Good day.  Bruising noted on left breast-scattered and left antecubital areas near insertion of her PICC line. No skin tear noted on left leg. Right arm Fistula- thrill heard and bruit felt. Vascular Procedure cancelled by Dr.Dew-no time to put her in the schedule. Family notified.Transferred to 227 via bed. Report given to [facility].

## 2017-11-15 LAB — GLUCOSE, CAPILLARY
GLUCOSE-CAPILLARY: 82 mg/dL (ref 65–99)
Glucose-Capillary: 85 mg/dL (ref 65–99)
Glucose-Capillary: 93 mg/dL (ref 65–99)
Glucose-Capillary: 103 mg/dL — ABNORMAL HIGH (ref 65–99)

## 2017-11-15 MED ORDER — SENNOSIDES-DOCUSATE SODIUM 8.6-50 MG PO TABS
2.0000 | ORAL_TABLET | Freq: Every day | ORAL | Status: DC
  Administered 2017-11-15: 2 via ORAL
  Filled 2017-11-15 (×4): qty 2

## 2017-11-15 NOTE — Progress Notes (Signed)
HD tx start 

## 2017-11-15 NOTE — Progress Notes (Signed)
[place] at [place] NAME: Sarah Hamilton    MR#:  [id number]  DATE OF BIRTH:  1946-03-28  SUBJECTIVE:  CHIEF COMPLAINT:   Chief Complaint  Patient presents with  . Shortness of Breath  Patient without complaint, no acute overnight per nursing staff, for hemodialysis later today, still in need of temporary tunneled hemodialysis catheter placement-suspect may be placed on Monday  REVIEW OF SYSTEMS:  CONSTITUTIONAL: No fever, fatigue or weakness.  EYES: No blurred or double vision.  EARS, NOSE, AND THROAT: No tinnitus or ear pain.  RESPIRATORY: No cough, shortness of breath, wheezing or hemoptysis.  CARDIOVASCULAR: No chest pain, orthopnea, edema.  GASTROINTESTINAL: No nausea, vomiting, diarrhea or abdominal pain.  GENITOURINARY: No dysuria, hematuria.  ENDOCRINE: No polyuria, nocturia,  HEMATOLOGY: No anemia, easy bruising or bleeding SKIN: No rash or lesion. MUSCULOSKELETAL: No joint pain or arthritis.   NEUROLOGIC: No tingling, numbness, weakness.  PSYCHIATRY: No anxiety or depression.   ROS  DRUG ALLERGIES:   Allergies  Allergen Reactions  . Pregabalin Other (See Comments)    Sleepy  . Azithromycin Other (See Comments)    Unknown  . Cymbalta [Duloxetine Hcl] Other (See Comments)    Per MAR  . Erythromycin Hives    VITALS:  Blood pressure (!) 162/130, pulse 78, temperature 98.7 F (37.1 C), temperature source Oral, resp. rate (!) 31, height 5\' 2"  (1.575 m), weight 104.4 kg (230 lb 2.6 oz), SpO2 91 %.  PHYSICAL EXAMINATION:  GENERAL:  72 y.o.-year-old patient lying in the bed with no acute distress.  EYES: Pupils equal, round, reactive to light and accommodation. No scleral icterus. Extraocular muscles intact.  HEENT: Head atraumatic, normocephalic. Oropharynx and nasopharynx clear.  NECK:  Supple, no jugular venous distention. No thyroid enlargement, no tenderness.  LUNGS: Normal breath sounds bilaterally, no wheezing,  rales,rhonchi or crepitation. No use of accessory muscles of respiration.  CARDIOVASCULAR: S1, S2 normal. No murmurs, rubs, or gallops.  ABDOMEN: Soft, nontender, nondistended. Bowel sounds present. No organomegaly or mass.  EXTREMITIES: No pedal edema, cyanosis, or clubbing.  NEUROLOGIC: Cranial nerves II through XII are intact. Muscle strength 5/5 in all extremities. Sensation intact. Gait not checked.  PSYCHIATRIC: The patient is alert and oriented x 3.  SKIN: No obvious rash, lesion, or ulcer.   Physical Exam LABORATORY PANEL:   CBC Recent Labs  Lab 11/14/17 0528  WBC 6.2  HGB 8.1*  HCT 24.8*  PLT 141*   ------------------------------------------------------------------------------------------------------------------  Chemistries  Recent Labs  Lab 11/12/17 1112  11/14/17 0528  NA  --    < > 137  K  --    < > 4.1  CL  --    < > 98*  CO2  --    < > 32  GLUCOSE  --    < > 92  BUN  --    < > 27*  CREATININE  --    < > 3.13*  CALCIUM  --    < > 7.8*  AST 16  --   --   ALT 10*  --   --   ALKPHOS 66  --   --   BILITOT 0.5  --   --    < > = values in this interval not displayed.   ------------------------------------------------------------------------------------------------------------------  Cardiac Enzymes Recent Labs  Lab 11/10/17 1542 11/11/17 0454  TROPONINI <0.03 <0.03   ------------------------------------------------------------------------------------------------------------------  RADIOLOGY:  No results found.  ASSESSMENT AND PLAN:  [name] a71  y.o.femalewith a known history of hypertension, CKD stage5,CAD status post stents, GERD, bilateral lower extremity edema, chronic iron deficiency anemia,seizure disorder presents to hospital secondary to worsening shortness of breath.  1acute end-stage renal disease For IJ tunnel dialysis catheter placement-suspect for placement on Monday Nephrology following for hemodialysis  needs-planning for hemodialysis later today, [place]  2acute hyperkalemia Secondary to above Resolved withcalcium gluconate, kayexalate,IV dextrose/insulin, and hemodialysis  3acute hypoxic respiratory failure Secondary to acute fluid overload state from newly diagnosed end-stage renal disease Resolved with hemodialysis, did require 1 night in the ICU on BiPAP which was successfully weaned off  4acute on chronic anemia most likely secondary to end-stage renal disease  Status post blood transfusion while in house and did receive Epogen  5chronic GERD without esophagitis  PPI daily  6acute on chronic seizure disorder, unspecified Patient transferred from floor to ICU for 1 night, seen by neurology -Depakote level repeat 29 after HD another 500 VPA given after HD, to continue Depakote 500 TID and 500 post each HD, continue seizure precautions, as needed Ativan for seizures  All the records are reviewed and case discussed with Care Management/Social Workerr. Management plans discussed with the patient, family and they are in agreement.  CODE STATUS: full  TOTAL TIME TAKING CARE OF THIS PATIENT: 35 minutes.     POSSIBLE D/C IN 2-5 DAYS, DEPENDING ON CLINICAL CONDITION.   Sarah Hamilton M.D on 11/15/2017   Between 7am to 6pm - Pager - [phone]  After 6pm go to [email or web address] - password [email or web address] [facility]  Office  [phone]  CC: Primary care physician; System, Pcp Not In  Note: This dictation was prepared with Dragon dictation along with smaller phrase technology. Any transcriptional errors that result from this process are unintentional.

## 2017-11-15 NOTE — Progress Notes (Signed)
HD tx end  

## 2017-11-15 NOTE — Progress Notes (Signed)
Post HD assessment  

## 2017-11-15 NOTE — Progress Notes (Signed)
[facility], [place] 11/15/17  Subjective:   Patient feels well today She is currently on room air She has tolerated her dialysis treatments well Patient is voiding good as well as fluid is being removed with dialysis She is scheduled for PermCath placement on Monday   HEMODIALYSIS FLOWSHEET:  Blood Flow Rate (mL/min): 300 mL/min Arterial Pressure (mmHg): -130 mmHg Venous Pressure (mmHg): 160 mmHg Transmembrane Pressure (mmHg): 70 mmHg Ultrafiltration Rate (mL/min): 1010 mL/min Dialysate Flow Rate (mL/min): 600 ml/min Conductivity: Machine : 14.5 Conductivity: Machine : 14.5 Dialysis Fluid Bolus: Normal Saline Bolus Amount (mL): 250 mL     Objective:  Vital signs in last 24 hours:  Temp:  [98.3 F (36.8 C)-98.8 F (37.1 C)] 98.4 F (36.9 C) (01/19 1304) Pulse Rate:  [65-84] 77 (01/19 1304) Resp:  [15-31] 17 (01/19 1304) BP: (109-185)/(64-130) 161/71 (01/19 1304) SpO2:  [91 %-99 %] 99 % (01/19 1304) Weight:  [104.4 kg (230 lb 2.6 oz)-141.2 kg (311 lb 4.6 oz)] 104.4 kg (230 lb 2.6 oz) (01/19 1241)  Weight change: -0.156 kg (-5.5 oz) Filed Weights   11/15/17 0515 11/15/17 0922 11/15/17 1241  Weight: (!) 139.8 kg (308 lb 4.8 oz) (!) 141.2 kg (311 lb 4.6 oz) 104.4 kg (230 lb 2.6 oz)    Intake/Output:    Intake/Output Summary (Last 24 hours) at 11/15/2017 1340 Last data filed at 11/15/2017 1241 Gross per 24 hour  Intake -  Output 4207 ml  Net -4207 ml     Physical Exam: General:  Chronically ill-appearing, mild distress  HEENT  moist oral mucous membranes  Neck  supple  Pulm/lungs  room air,   CVS/Heart  regular, no rub  Abdomen:   Soft, nontender   Extremities:  2+ pitting edema bilaterally  Neurologic:  Alert, able to follow commands  Skin:  Dry skin  Access:  Right arm developing brachiocephalic fistula, right femoral temporary dialysis catheter       Basic Metabolic Panel:  Recent Labs  Lab 11/10/17 1542 11/11/17 0235  11/11/17 0454 11/12/17 2207 11/13/17 0442 11/14/17 0528  NA 143  --  142 138 135 137  K 6.3* 5.2* 5.7* 4.5 4.3 4.1  CL 109  --  110 100* 98* 98*  CO2 23  --  24 27 30  32  GLUCOSE 91  --  78 109* 97 92  BUN 74*  --  70* 35* 40* 27*  CREATININE 4.77*  --  4.55* 3.49* 3.43* 3.13*  CALCIUM 7.9*  --  7.3* 7.5* 7.5* 7.8*  PHOS  --   --   --   --  5.2*  --      CBC: Recent Labs  Lab 11/10/17 1542 11/11/17 0454 11/11/17 1100 11/12/17 2207 11/13/17 0442 11/14/17 0528  WBC 5.8 5.1  --  5.7 6.0 6.2  NEUTROABS 3.6  --   --  3.2  --   --   HGB 6.8* 6.9* 7.6* 8.5* 8.4* 8.1*  HCT 21.2* 21.6* 23.8* 25.8* 25.7* 24.8*  MCV 98.6 96.0  --  94.0 95.1 94.5  PLT 171 136*  --  152 144* 141*      Lab Results  Component Value Date   HEPBSAG Negative 11/11/2017   HEPBSAB Reactive 11/11/2017      Microbiology:  Recent Results (from the past 240 hour(s))  MRSA PCR Screening     Status: None   Collection Time: 11/10/17  9:41 PM  Result Value Ref Range Status   MRSA by PCR  NEGATIVE NEGATIVE Final    Comment:        The GeneXpert MRSA Assay (FDA approved for NASAL specimens only), is one component of a comprehensive MRSA colonization surveillance program. It is not intended to diagnose MRSA infection nor to guide or monitor treatment for MRSA infections. Performed at [facility], [address] [place], [place] [address]     Coagulation Studies: No results for input(s): LABPROT, INR in the last 72 hours.  Urinalysis: No results for input(s): COLORURINE, LABSPEC, PHURINE, GLUCOSEU, HGBUR, BILIRUBINUR, KETONESUR, PROTEINUR, UROBILINOGEN, NITRITE, LEUKOCYTESUR in the last 72 hours.  Invalid input(s): APPERANCEUR    Imaging: No results found.   Medications:    . atorvastatin  20 mg Oral QHS  . carvedilol  3.125 mg Oral BID  . cholecalciferol  1,000 Units Oral Daily  . febuxostat  40 mg Oral Daily  . heparin  5,000 Units Subcutaneous Q8H  . isosorbide  mononitrate  60 mg Oral QPM  . pantoprazole  40 mg Oral Daily  . senna-docusate  2 tablet Oral QHS  . sevelamer carbonate  800 mg Oral TID WC  . sodium chloride flush  10-40 mL Intracatheter Q12H  . torsemide  100 mg Oral Daily  . valproic acid  500 mg Oral TID  . valproic acid  500 mg Oral Q T,Th,Sat-1800   acetaminophen **OR** acetaminophen, ipratropium-albuterol, LORazepam, nitroGLYCERIN, [DISCONTINUED] ondansetron **OR** ondansetron (ZOFRAN) IV, oxyCODONE, sodium chloride flush  Assessment/ Plan:  72 y.o. African-American female with severe hypertension, chronic kidney disease, obesity gout, was admitted on 11/10/2017 with acute shortness of breath and volume overload.   1.  ESRD   2.  Acute pulmonary edema 3.  Anemia of chronic kidney disease 4.  Lower extremity edema 5.  Hyperkalemia  Dialysis was initiated on January 15 She has had several dialysis treatments which has resulted in significant clinical improvement Potassium level is now normal   IJ tunneled dialysis catheter is planned for Monday May remove temporary dialysis catheter and increased mobility Blood transfusion for anemia.   Hold Epogen for now due to recent seizures  D/C planning is in progress PPD was placed on 1/15 Hep B studies (core Ab + and Surface Ab POS) Antigen is NEG Patient pathways [facility]       LOS: [place] 1/19/20191:40 PM  [address] [place], [place]

## 2017-11-15 NOTE — Progress Notes (Signed)
Pre HD assessment  

## 2017-11-15 NOTE — Progress Notes (Signed)
Per Dr. Candiss Norse order dialysis femoral catheter removed. Catheter was intact. Pt tolerated well catheter removal. No bleeding at this point will continue to assess and monitor pt.

## 2017-11-16 LAB — GLUCOSE, CAPILLARY
GLUCOSE-CAPILLARY: 89 mg/dL (ref 65–99)
GLUCOSE-CAPILLARY: 101 mg/dL — AB (ref 65–99)
Glucose-Capillary: 82 mg/dL (ref 65–99)
Glucose-Capillary: 85 mg/dL (ref 65–99)

## 2017-11-16 MED ORDER — HYDRALAZINE HCL 20 MG/ML IJ SOLN
10.0000 mg | INTRAMUSCULAR | Status: DC | PRN
  Administered 2017-11-16: 10 mg via INTRAVENOUS
  Filled 2017-11-16: qty 1

## 2017-11-16 MED ORDER — SODIUM CHLORIDE 0.9 % IV SOLN
INTRAVENOUS | Status: DC
  Administered 2017-11-16: 22:00:00 via INTRAVENOUS

## 2017-11-16 MED ORDER — DEXTROSE 5 % IV SOLN
1.5000 g | INTRAVENOUS | Status: DC
  Administered 2017-11-17: 1.5 g via INTRAVENOUS
  Filled 2017-11-16: qty 1.5

## 2017-11-16 MED ORDER — LACTULOSE 10 GM/15ML PO SOLN
30.0000 g | Freq: Two times a day (BID) | ORAL | Status: DC
  Administered 2017-11-16 – 2017-11-18 (×2): 30 g via ORAL
  Filled 2017-11-16 (×4): qty 60

## 2017-11-16 NOTE — Progress Notes (Signed)
[place] at [place] NAME: Sarah Hamilton    MR#:  [id number]  DATE OF BIRTH:  1945/11/28  SUBJECTIVE:  CHIEF COMPLAINT:   Chief Complaint  Patient presents with  . Shortness of Breath  Patient complains of indigestion only, no events overnight per nursing staff, successfully weaned off oxygen, for temporary IJ hemodialysis catheter placement on tomorrow  REVIEW OF SYSTEMS:  CONSTITUTIONAL: No fever, fatigue or weakness.  EYES: No blurred or double vision.  EARS, NOSE, AND THROAT: No tinnitus or ear pain.  RESPIRATORY: No cough, shortness of breath, wheezing or hemoptysis.  CARDIOVASCULAR: No chest pain, orthopnea, edema.  GASTROINTESTINAL: No nausea, vomiting, diarrhea or abdominal pain.  GENITOURINARY: No dysuria, hematuria.  ENDOCRINE: No polyuria, nocturia,  HEMATOLOGY: No anemia, easy bruising or bleeding SKIN: No rash or lesion. MUSCULOSKELETAL: No joint pain or arthritis.   NEUROLOGIC: No tingling, numbness, weakness.  PSYCHIATRY: No anxiety or depression.   ROS  DRUG ALLERGIES:   Allergies  Allergen Reactions  . Pregabalin Other (See Comments)    Sleepy  . Azithromycin Other (See Comments)    Unknown  . Cymbalta [Duloxetine Hcl] Other (See Comments)    Per MAR  . Erythromycin Hives    VITALS:  Blood pressure (!) 140/96, pulse 81, temperature 98.6 F (37 C), temperature source Oral, resp. rate 20, height 5\' 2"  (1.575 m), weight (!) 136.3 kg (300 lb 6.4 oz), SpO2 95 %.  PHYSICAL EXAMINATION:  GENERAL:  72 y.o.-year-old patient lying in the bed with no acute distress.  EYES: Pupils equal, round, reactive to light and accommodation. No scleral icterus. Extraocular muscles intact.  HEENT: Head atraumatic, normocephalic. Oropharynx and nasopharynx clear.  NECK:  Supple, no jugular venous distention. No thyroid enlargement, no tenderness.  LUNGS: Normal breath sounds bilaterally, no wheezing, rales,rhonchi or crepitation.  No use of accessory muscles of respiration.  CARDIOVASCULAR: S1, S2 normal. No murmurs, rubs, or gallops.  ABDOMEN: Soft, nontender, nondistended. Bowel sounds present. No organomegaly or mass.  EXTREMITIES: No pedal edema, cyanosis, or clubbing.  NEUROLOGIC: Cranial nerves II through XII are intact. Muscle strength 5/5 in all extremities. Sensation intact. Gait not checked.  PSYCHIATRIC: The patient is alert and oriented x 3.  SKIN: No obvious rash, lesion, or ulcer.   Physical Exam LABORATORY PANEL:   CBC Recent Labs  Lab 11/14/17 0528  WBC 6.2  HGB 8.1*  HCT 24.8*  PLT 141*   ------------------------------------------------------------------------------------------------------------------  Chemistries  Recent Labs  Lab 11/12/17 1112  11/14/17 0528  NA  --    < > 137  K  --    < > 4.1  CL  --    < > 98*  CO2  --    < > 32  GLUCOSE  --    < > 92  BUN  --    < > 27*  CREATININE  --    < > 3.13*  CALCIUM  --    < > 7.8*  AST 16  --   --   ALT 10*  --   --   ALKPHOS 66  --   --   BILITOT 0.5  --   --    < > = values in this interval not displayed.   ------------------------------------------------------------------------------------------------------------------  Cardiac Enzymes Recent Labs  Lab 11/10/17 1542 11/11/17 0454  TROPONINI <0.03 <0.03   ------------------------------------------------------------------------------------------------------------------  RADIOLOGY:  No results found.  ASSESSMENT AND PLAN:  [name] a71 y.o.femalewith a known history  of hypertension, CKD stage5,CAD status post stents, GERD, bilateral lower extremity edema, chronic iron deficiency anemia,seizure disorder presents to hospital secondary to worsening shortness of breath secondary to newly diagnosed end-stage renal disease, awaiting temporary IJ hemodialysis catheter-plan for Monday.  1acute end-stage renal disease Stable ForIJ tunnel dialysis catheter  placement on tomorrow Nephrology following for HD needs [place]  2acute hyperkalemia Secondary to above Resolved withcalcium gluconate, kayexalate,IV dextrose/insulin, and hemodialysis  3acute hypoxic respiratory failure Resolved with hemodialysis, did require 1 night in the ICU on BiPAP Secondary to acute fluid overload state from newly diagnosed end-stage renal disease  4acute on chronic anemia most likely secondary to end-stage renal disease S/p blood transfusion while in house and did receive Epogen  5chronic GERD without esophagitis  PPI daily  6acute on chronic seizure disorder, unspecified Stable Patient transferred from floor to ICU for1 night, seen by neurology-Depakotelevel repeat 29 after HD another 500 VPA given after HD, to continueDepakote500 TID and 500 post each HD, continue seizure precautions, as needed Ativan for seizures  All the records are reviewed and case discussed with Care Management/Social Workerr. Management plans discussed with the patient, family and they are in agreement.  CODE STATUS: full TOTAL TIME TAKING CARE OF THIS PATIENT: 45 minutes.     POSSIBLE D/C IN 1-2 DAYS, DEPENDING ON CLINICAL CONDITION.   Sarah Hamilton Salary M.D on 11/16/2017   Between 7am to 6pm - Pager - [phone]  After 6pm go to [email or web address] - password [email or web address] [facility]  Office  [phone]  CC: Primary care physician; System, Pcp Not In  Note: This dictation was prepared with Dragon dictation along with smaller phrase technology. Any transcriptional errors that result from this process are unintentional.

## 2017-11-16 NOTE — Progress Notes (Signed)
Per MD okay for RN to put order for hydralazine 10 mg PRN. Pt's BP 189/84. Also an order for lactulose twice a day.

## 2017-11-16 NOTE — Progress Notes (Signed)
[facility], [place] 11/16/17  Subjective:   Patient feels well today She is currently on room air She has tolerated her dialysis treatments well Patient is voiding good as well as fluid is being removed with dialysis She is scheduled for PermCath placement on Monday States she is able to eat without nausea or vomiting Femoral dialysis catheter has been removed   Objective:  Vital signs in last 24 hours:  Temp:  [98.4 F (36.9 C)-99.2 F (37.3 C)] 98.6 F (37 C) (01/20 0620) Pulse Rate:  [77-84] 81 (01/20 0900) Resp:  [15-31] 20 (01/20 0620) BP: (123-163)/(65-130) 140/96 (01/20 0900) SpO2:  [91 %-99 %] 95 % (01/20 0620) Weight:  [104.4 kg (230 lb 2.6 oz)-136.3 kg (300 lb 6.4 oz)] 136.3 kg (300 lb 6.4 oz) (01/20 0620)  Weight change: 1.356 kg (2 lb 15.8 oz) Filed Weights   11/15/17 0922 11/15/17 1241 11/16/17 0620  Weight: (!) 141.2 kg (311 lb 4.6 oz) 104.4 kg (230 lb 2.6 oz) (!) 136.3 kg (300 lb 6.4 oz)    Intake/Output:    Intake/Output Summary (Last 24 hours) at 11/16/2017 1135 Last data filed at 11/16/2017 1026 Gross per 24 hour  Intake 780 ml  Output 5307 ml  Net -4527 ml     Physical Exam: General:  Chronically ill-appearing, mild distress  HEENT  moist oral mucous membranes  Neck  supple  Pulm/lungs  room air,   CVS/Heart  regular, no rub  Abdomen:   Soft, nontender   Extremities:  2+ pitting edema bilaterally  Neurologic:  Alert, able to follow commands  Skin:  Dry skin  Access:  Right arm developing brachiocephalic fistula,        Basic Metabolic Panel:  Recent Labs  Lab 11/10/17 1542 11/11/17 0235 11/11/17 0454 11/12/17 2207 11/13/17 0442 11/14/17 0528  NA 143  --  142 138 135 137  K 6.3* 5.2* 5.7* 4.5 4.3 4.1  CL 109  --  110 100* 98* 98*  CO2 23  --  24 27 30  32  GLUCOSE 91  --  78 109* 97 92  BUN 74*  --  70* 35* 40* 27*  CREATININE 4.77*  --  4.55* 3.49* 3.43* 3.13*  CALCIUM 7.9*  --  7.3* 7.5* 7.5* 7.8*   PHOS  --   --   --   --  5.2*  --      CBC: Recent Labs  Lab 11/10/17 1542 11/11/17 0454 11/11/17 1100 11/12/17 2207 11/13/17 0442 11/14/17 0528  WBC 5.8 5.1  --  5.7 6.0 6.2  NEUTROABS 3.6  --   --  3.2  --   --   HGB 6.8* 6.9* 7.6* 8.5* 8.4* 8.1*  HCT 21.2* 21.6* 23.8* 25.8* 25.7* 24.8*  MCV 98.6 96.0  --  94.0 95.1 94.5  PLT 171 136*  --  152 144* 141*      Lab Results  Component Value Date   HEPBSAG Negative 11/11/2017   HEPBSAB Reactive 11/11/2017      Microbiology:  Recent Results (from the past 240 hour(s))  MRSA PCR Screening     Status: None   Collection Time: 11/10/17  9:41 PM  Result Value Ref Range Status   MRSA by PCR NEGATIVE NEGATIVE Final    Comment:        The GeneXpert MRSA Assay (FDA approved for NASAL specimens only), is one component of a comprehensive MRSA colonization surveillance program. It is not intended to diagnose MRSA infection  nor to guide or monitor treatment for MRSA infections. Performed at [facility], [address] [place], [place] [address]     Coagulation Studies: No results for input(s): LABPROT, INR in the last 72 hours.  Urinalysis: No results for input(s): COLORURINE, LABSPEC, PHURINE, GLUCOSEU, HGBUR, BILIRUBINUR, KETONESUR, PROTEINUR, UROBILINOGEN, NITRITE, LEUKOCYTESUR in the last 72 hours.  Invalid input(s): APPERANCEUR    Imaging: No results found.   Medications:    . atorvastatin  20 mg Oral QHS  . carvedilol  3.125 mg Oral BID  . cholecalciferol  1,000 Units Oral Daily  . febuxostat  40 mg Oral Daily  . heparin  5,000 Units Subcutaneous Q8H  . isosorbide mononitrate  60 mg Oral QPM  . pantoprazole  40 mg Oral Daily  . senna-docusate  2 tablet Oral QHS  . sevelamer carbonate  800 mg Oral TID WC  . sodium chloride flush  10-40 mL Intracatheter Q12H  . torsemide  100 mg Oral Daily  . valproic acid  500 mg Oral TID  . valproic acid  500 mg Oral Q T,Th,Sat-1800   acetaminophen  **OR** acetaminophen, ipratropium-albuterol, LORazepam, nitroGLYCERIN, [DISCONTINUED] ondansetron **OR** ondansetron (ZOFRAN) IV, oxyCODONE, sodium chloride flush  Assessment/ Plan:  72 y.o. African-American female with severe hypertension, chronic kidney disease, obesity gout, was admitted on 11/10/2017 with acute shortness of breath and volume overload.   1.  ESRD   2.  Acute pulmonary edema 3.  Anemia of chronic kidney disease 4.  Lower extremity edema 5.  Hyperkalemia  Dialysis was initiated on January 15 She has had several dialysis treatments which has resulted in significant clinical improvement Potassium level is now normal   IJ tunneled dialysis catheter is planned for Monday temporary dialysis catheter has been removed to increase mobility Blood transfusion for anemia.   Hold Epogen for now due to recent seizures  D/C planning is in progress PPD was placed on 1/15 Hep B studies (core Ab + and Surface Ab POS) Antigen is NEG Patient [facility] She will also be [facility]    LOS: [place] 1/20/201911:35 AM  [facility] [place], [place]

## 2017-11-17 ENCOUNTER — Inpatient Hospital Stay
Admission: EM | Disposition: A | Discharge status: Skilled Nursing Facility | Payer: Self-pay | Source: Home / Self Care | Attending: Family Medicine

## 2017-11-17 DIAGNOSIS — N186 End stage renal disease: Secondary | ICD-10-CM

## 2017-11-17 HISTORY — PX: DIALYSIS/PERMA CATHETER INSERTION: CATH118288

## 2017-11-17 LAB — GLUCOSE, CAPILLARY
GLUCOSE-CAPILLARY: 80 mg/dL (ref 65–99)
Glucose-Capillary: 82 mg/dL (ref 65–99)
Glucose-Capillary: 91 mg/dL (ref 65–99)

## 2017-11-17 SURGERY — DIALYSIS/PERMA CATHETER INSERTION
Anesthesia: Moderate Sedation

## 2017-11-17 MED ORDER — MIDAZOLAM HCL 2 MG/2ML IJ SOLN
INTRAMUSCULAR | Status: DC | PRN
  Administered 2017-11-17 (×3): 0.5 mg via INTRAVENOUS

## 2017-11-17 MED ORDER — DEXTROSE 5 % IV SOLN
INTRAVENOUS | Status: AC
  Filled 2017-11-17: qty 1.5

## 2017-11-17 MED ORDER — LIDOCAINE-EPINEPHRINE (PF) 1 %-1:200000 IJ SOLN
INTRAMUSCULAR | Status: AC
  Filled 2017-11-17: qty 30

## 2017-11-17 MED ORDER — HEPARIN SODIUM (PORCINE) 10000 UNIT/ML IJ SOLN
INTRAMUSCULAR | Status: AC
  Filled 2017-11-17: qty 1

## 2017-11-17 MED ORDER — NEPRO/CARBSTEADY PO LIQD
237.0000 mL | Freq: Two times a day (BID) | ORAL | Status: DC
  Administered 2017-11-18: 237 mL via ORAL

## 2017-11-17 MED ORDER — VALPROIC ACID 250 MG PO CAPS
500.0000 mg | ORAL_CAPSULE | Freq: Once | ORAL | Status: AC
  Filled 2017-11-17: qty 2

## 2017-11-17 MED ORDER — RENA-VITE PO TABS
1.0000 | ORAL_TABLET | Freq: Every day | ORAL | Status: DC
  Administered 2017-11-18: 1 via ORAL
  Filled 2017-11-17: qty 1

## 2017-11-17 MED ORDER — FENTANYL CITRATE (PF) 100 MCG/2ML IJ SOLN
INTRAMUSCULAR | Status: AC
  Filled 2017-11-17: qty 2

## 2017-11-17 MED ORDER — MIDAZOLAM HCL 5 MG/5ML IJ SOLN
INTRAMUSCULAR | Status: AC
  Filled 2017-11-17: qty 5

## 2017-11-17 MED ORDER — FENTANYL CITRATE (PF) 100 MCG/2ML IJ SOLN
INTRAMUSCULAR | Status: DC | PRN
  Administered 2017-11-17 (×4): 25 ug via INTRAVENOUS

## 2017-11-17 SURGICAL SUPPLY — 11 items
CANNULA 5F STIFF (CANNULA) ×3 IMPLANT
CATH PALINDROME RT-P 15FX19CM (CATHETERS) ×3 IMPLANT
COVER PROBE U/S 5X48 (MISCELLANEOUS) ×3 IMPLANT
DERMABOND ADVANCED (GAUZE/BANDAGES/DRESSINGS) ×2
DERMABOND ADVANCED .7 DNX12 (GAUZE/BANDAGES/DRESSINGS) ×1 IMPLANT
PACK ANGIOGRAPHY (CUSTOM PROCEDURE TRAY) ×3 IMPLANT
SUT MNCRL 4-0 (SUTURE) ×2
SUT MNCRL 4-0 27XMFL (SUTURE) ×1
SUT PROLENE 0 CT 1 30 (SUTURE) ×3 IMPLANT
SUTURE MNCRL 4-0 27XMF (SUTURE) ×1 IMPLANT
TOWEL OR 17X26 4PK STRL BLUE (TOWEL DISPOSABLE) ×3 IMPLANT

## 2017-11-17 NOTE — Progress Notes (Signed)
Dialysis treatment started. 

## 2017-11-17 NOTE — Progress Notes (Signed)
Physical Therapy Treatment Patient Details Name: Sarah Hamilton MRN: [id number] DOB: 1945/12/19 Today's Date: 11/17/2017    History of Present Illness 72 y.o. female with a known history of hypertension, CK D stage 5, CAD status post stents, GERD, bilateral lower extremity edema, chronic iron deficiency anemia, seizure disorder presents to hospital secondary to worsening shortness of breath.    PT Comments    Pt remains exceedingly weak but is able to perform mobility with therapist on this date. She requires maxA+2 for bed mobility and to scoot up to EOB to prepare for transfers. She requires cues for safe hand placement and sequencing with transfers. Once in standing pt able to perform limited marching with heavy UE assist. Attempted forward/backwards ambulation with patient but she is too weak to perform. Pt returned to sitting and Hoyer lift used to transfer pt from bed to recliner. Unable to safely ambulate at this time. Pt is too fatigued at end of session to participate with exercises. Pt left upright in recliner with RN to assess sitting tolerance for dialysis. Pt will benefit from PT services to address deficits in strength, balance, and mobility in order to return to full function at home.    Follow Up Recommendations  SNF     Equipment Recommendations       Recommendations for Other Services       Precautions / Restrictions Precautions Precautions: Fall Restrictions Weight Bearing Restrictions: No    Mobility  Bed Mobility Overal bed mobility: Needs Assistance Bed Mobility: Supine to Sit     Supine to sit: Max assist;+2 for physical assistance     General bed mobility comments: Heavy cues and instruction for bed mobility. HOB elevated and use of rails. Pt requires maxA+2 to scoot up toward HOB. Stable in sitting at EOB  Transfers Overall transfer level: Needs assistance Equipment used: Rolling walker (2 wheeled) Transfers: Sit to/from Stand Sit to Stand: Mod  assist;+2 physical assistance         General transfer comment: Pt requires cues for safe hand placement and sequencing with transfers. Once in standing pt able to perform limited marching with heavy UE assist. Attempted forward/backwards ambulation with patient but she is too weak to perform. Pt returned to sitting and Hoyer lift used to transfer pt from bed to recliner. Unable to safely ambulate at this time  Ambulation/Gait                 Stairs            Wheelchair Mobility    Modified Rankin (Stroke Patients Only)       Balance Overall balance assessment: Needs assistance Sitting-balance support: No upper extremity supported Sitting balance-Leahy Scale: Good     Standing balance support: Bilateral upper extremity supported Standing balance-Leahy Scale: Fair Standing balance comment: Able to stabilize with rolling walker in standing                            Cognition Arousal/Alertness: Awake/alert Behavior During Therapy: WFL for tasks assessed/performed Overall Cognitive Status: Within Functional Limits for tasks assessed                                        Exercises      General Comments        Pertinent Vitals/Pain Pain Assessment: No/denies pain    Home  Living                      Prior Function            PT Goals (current goals can now be found in the care plan section) Acute Rehab PT Goals Patient Stated Goal: get stronger PT Goal Formulation: With patient Time For Goal Achievement: 11/28/17 Potential to Achieve Goals: Fair Progress towards PT goals: Progressing toward goals    Frequency    Min 2X/week      PT Plan Current plan remains appropriate    Co-evaluation              AM-PAC PT "6 Clicks" Daily Activity  Outcome Measure  Difficulty turning over in bed (including adjusting bedclothes, sheets and blankets)?: Unable Difficulty moving from lying on back to sitting  on the side of the bed? : Unable Difficulty sitting down on and standing up from a chair with arms (e.g., wheelchair, bedside commode, etc,.)?: Unable Help needed moving to and from a bed to chair (including a wheelchair)?: Total Help needed walking in hospital room?: Total Help needed climbing 3-5 steps with a railing? : Total 6 Click Score: 6    End of Session Equipment Utilized During Treatment: Gait belt Activity Tolerance: Patient limited by fatigue(deferred mobility secondary to temp fem cath) Patient left: with call bell/phone within reach;in chair;with nursing/sitter in room Nurse Communication: Mobility status;Other (comment)(RN present to observe) PT Visit Diagnosis: Muscle weakness (generalized) (M62.81);Difficulty in walking, not elsewhere classified (R26.2)     Time: 3382-5053 PT Time Calculation (min) (ACUTE ONLY): 42 min  Charges:  $Therapeutic Activity: 23-37 mins                    G Codes:       Lyndel Safe Huprich PT, DPT     Huprich,Jason 11/17/2017, 1:17 PM

## 2017-11-17 NOTE — Progress Notes (Signed)
Dialysis Treatment ended safely.  

## 2017-11-17 NOTE — Care Management (Signed)
Per Elvera Bicker HD liaison outpatient schedule is as follows [facility] MWF at 10 am.

## 2017-11-17 NOTE — Progress Notes (Signed)
[facility], [place] 11/17/17  Subjective:  Patient reports that she is feeling well today admits to some edema in lower extremities slightly tender.  PermCath placement is planned today and dialysis will occur afterwards.  She expressed that she is wanting to go to dialysis at the same location as her son in [place] but the location is unknown at this time.  She is followed by [facility].    Objective:  Vital signs in last 24 hours:  Temp:  [98.2 F (36.8 C)-98.9 F (37.2 C)] 98.2 F (36.8 C) (01/21 0628) Pulse Rate:  [75-85] 85 (01/21 0628) Resp:  [18-20] 20 (01/21 0628) BP: (139-189)/(65-84) 139/65 (01/21 0628) SpO2:  [97 %-98 %] 98 % (01/21 0628) Weight:  [134.5 kg (296 lb 8 oz)] 134.5 kg (296 lb 8 oz) (01/21 0500)  Weight change: -6.708 kg (-12.6 oz) Filed Weights   11/15/17 1241 11/16/17 0620 11/17/17 0500  Weight: 104.4 kg (230 lb 2.6 oz) (!) 136.3 kg (300 lb 6.4 oz) 134.5 kg (296 lb 8 oz)    Intake/Output:    Intake/Output Summary (Last 24 hours) at 11/17/2017 1110 Last data filed at 11/17/2017 0710 Gross per 24 hour  Intake 176 ml  Output 1700 ml  Net -1524 ml     Physical Exam: General:  Chronically ill-appearing, resting comfortably.   HEENT  moist oral mucous membranes  Neck  supple  Pulm/lungs  room air, clear to auscultation.   CVS/Heart  regular, no rub  Abdomen:   Soft, nontender   Extremities:  2+ pitting edema bilaterally  Neurologic:  Alert, able to follow commands  Skin:  Dry skin  Access:  Right arm developing brachiocephalic fistula        Basic Metabolic Panel:  Recent Labs  Lab 11/10/17 1542 11/11/17 0235 11/11/17 0454 11/12/17 2207 11/13/17 0442 11/14/17 0528  NA 143  --  142 138 135 137  K 6.3* 5.2* 5.7* 4.5 4.3 4.1  CL 109  --  110 100* 98* 98*  CO2 23  --  24 27 30  32  GLUCOSE 91  --  78 109* 97 92  BUN 74*  --  70* 35* 40* 27*  CREATININE 4.77*  --  4.55* 3.49* 3.43* 3.13*  CALCIUM 7.9*  --  7.3* 7.5*  7.5* 7.8*  PHOS  --   --   --   --  5.2*  --      CBC: Recent Labs  Lab 11/10/17 1542 11/11/17 0454 11/11/17 1100 11/12/17 2207 11/13/17 0442 11/14/17 0528  WBC 5.8 5.1  --  5.7 6.0 6.2  NEUTROABS 3.6  --   --  3.2  --   --   HGB 6.8* 6.9* 7.6* 8.5* 8.4* 8.1*  HCT 21.2* 21.6* 23.8* 25.8* 25.7* 24.8*  MCV 98.6 96.0  --  94.0 95.1 94.5  PLT 171 136*  --  152 144* 141*      Lab Results  Component Value Date   HEPBSAG Negative 11/11/2017   HEPBSAB Reactive 11/11/2017      Microbiology:  Recent Results (from the past 240 hour(s))  MRSA PCR Screening     Status: None   Collection Time: 11/10/17  9:41 PM  Result Value Ref Range Status   MRSA by PCR NEGATIVE NEGATIVE Final    Comment:        The GeneXpert MRSA Assay (FDA approved for NASAL specimens only), is one component of a comprehensive MRSA colonization surveillance program. It is not intended  to diagnose MRSA infection nor to guide or monitor treatment for MRSA infections. Performed at [facility], [address] [place], [place] [address]     Coagulation Studies: No results for input(s): LABPROT, INR in the last 72 hours.  Urinalysis: No results for input(s): COLORURINE, LABSPEC, PHURINE, GLUCOSEU, HGBUR, BILIRUBINUR, KETONESUR, PROTEINUR, UROBILINOGEN, NITRITE, LEUKOCYTESUR in the last 72 hours.  Invalid input(s): APPERANCEUR    Imaging: No results found.   Medications:   . sodium chloride Stopped (11/17/17 0810)  . cefUROXime (ZINACEF)  IV     . atorvastatin  20 mg Oral QHS  . carvedilol  3.125 mg Oral BID  . cholecalciferol  1,000 Units Oral Daily  . febuxostat  40 mg Oral Daily  . heparin  5,000 Units Subcutaneous Q8H  . isosorbide mononitrate  60 mg Oral QPM  . lactulose  30 g Oral BID  . pantoprazole  40 mg Oral Daily  . senna-docusate  2 tablet Oral QHS  . sevelamer carbonate  800 mg Oral TID WC  . sodium chloride flush  10-40 mL Intracatheter Q12H  . torsemide  100  mg Oral Daily  . valproic acid  500 mg Oral TID  . valproic acid  500 mg Oral Q T,Th,Sat-1800   acetaminophen **OR** acetaminophen, hydrALAZINE, ipratropium-albuterol, LORazepam, nitroGLYCERIN, [DISCONTINUED] ondansetron **OR** ondansetron (ZOFRAN) IV, oxyCODONE, sodium chloride flush  Assessment/ Plan:  72 y.o. African-American female with severe hypertension, chronic kidney disease, obesity gout, was admitted on 11/10/2017 with acute shortness of breath and volume overload.   1.  ESRD   2.  Acute pulmonary edema 3.  Anemia of chronic kidney disease 4.  Lower extremity edema 5.  Hyperkalemia  Dialysis was initiated on January 15 She has had several dialysis treatments which has resulted in significant clinical improvement. Potassium level is now normal   Plan:  PermCath placement is planned for today.  Plan for dialysis to occur afterward.  Hold EPO at this time.  We will continue to monitor.  We are still planning for outpatient hemodialysis placement.  She would like to go to the Center where her son is at however this is in [facility].  Since the patient is a pace program patient we may not be able to accommodate this.    LOS: 7 Merelyn Klump 1/21/201911:10 AM  [facility] [place], [place]

## 2017-11-17 NOTE — Progress Notes (Addendum)
Initial Nutrition Assessment  DOCUMENTATION CODES:   Morbid obesity  INTERVENTION:   Nepro Shake po BID, each supplement provides 425 kcal and 19 grams protein  Rena-vite daily   NUTRITION DIAGNOSIS:   Increased nutrient needs related to catabolic illness(ESRD on HD) as evidenced by increased estimated needs from protein.  GOAL:   Patient will meet greater than or equal to 90% of their needs  MONITOR:   PO intake, Supplement acceptance, Labs, Weight trends, I & O's  REASON FOR ASSESSMENT:   Diagnosis    ASSESSMENT:   72 y.o. female with a known history of hypertension, CKD stage 5, CAD status post stents, GERD, bilateral lower extremity edema, chronic iron deficiency anemia, seizure disorder presents to hospital secondary to worsening shortness of breath secondary to newly diagnosed end-stage renal disease   Met with pt in room today. Pt slightly confused and is a poor historian. Pt reports good appetite today. Pt eating well prior to NPO. Pt NPO today for perm cath placement. Pt with new HD initiated on 1/15. Unable to provide renal education r/t confusion. RD will order supplements and rena-vite daily. Per chart, pt is weight stable. Pt to likely discharge tomorrow.   Medications reviewed and include: vitamin D, heparin, lactulose, protonix, senokot, renvela, torsemide, NaCl @ 14m/hr  Labs reviewed: P 5.2(H)- 1/17 Hgb 8.1(L), Hct 24.8(L)  Nutrition-Focused physical exam completed. Findings are no fat depletion, no muscle depletion, and moderate edema.   Diet Order:  Seizure precautions Diet NPO time specified  EDUCATION NEEDS:   Not appropriate for education at this time  Skin:  Reviewed RN Assessment  Last BM:  1/20- type 5  Height:   Ht Readings from Last 1 Encounters:  11/11/17 _0  (1.575 m)    Weight:   Wt Readings from Last 1 Encounters:  11/17/17 296 lb 8 oz (134.5 kg)    Ideal Body Weight:  50 kg  BMI:  Body mass index is 54.23  kg/m.  Estimated Nutritional Needs:   Kcal:  2000-2300kcal/day   Protein:  >134g/day   Fluid:  per MD  CKoleen DistanceMS, RD, LDN Pager #[phone] Hours Pager: [phone]

## 2017-11-17 NOTE — H&P (Signed)
 [facility] History & Physical Update  The patient was interviewed and re-examined.  The patient's previous History and Physical has been reviewed and is unchanged.  There is no change in the plan of care. We plan to proceed with the scheduled procedure.  Leotis Pain, MD  11/17/2017, 4:21 PM

## 2017-11-17 NOTE — Progress Notes (Signed)
Sarah Hamilton at [place] NAME: Sarah Hamilton    MR#:  [id number]  DATE OF BIRTH:  September 22, 1946  SUBJECTIVE:denies Shortness of breath, complains of leg pain.  CHIEF COMPLAINT:   For permacath placement today. REVIEW OF SYSTEMS:  CONSTITUTIONAL: No fever, fatigue or weakness.  EYES: No blurred or double vision.  EARS, NOSE, AND THROAT: No tinnitus or ear pain.  RESPIRATORY: No cough, shortness of breath, wheezing or hemoptysis.  CARDIOVASCULAR: No chest pain, orthopnea, edema.  GASTROINTESTINAL: No nausea, vomiting, diarrhea or abdominal pain.  GENITOURINARY: No dysuria, hematuria.  ENDOCRINE: No polyuria, nocturia,  HEMATOLOGY: No anemia, easy bruising or bleeding SKIN: No rash or lesion. MUSCULOSKELETAL: No joint pain or arthritis.   NEUROLOGIC: No tingling, numbness, weakness.  PSYCHIATRY: No anxiety or depression.   ROS  DRUG ALLERGIES:   Allergies  Allergen Reactions  . Pregabalin Other (See Comments)    Sleepy  . Azithromycin Other (See Comments)    Unknown  . Cymbalta [Duloxetine Hcl] Other (See Comments)    Per MAR  . Erythromycin Hives    VITALS:  Blood pressure (!) 156/75, pulse 73, temperature 98.3 F (36.8 C), temperature source Oral, resp. rate 14, height 5\' 2"  (1.575 m), weight 134.5 kg (296 lb 8 oz), SpO2 98 %.  PHYSICAL EXAMINATION:  GENERAL:  72 y.o.-year-old patient lying in the bed with no acute distress.  EYES: Pupils equal, round, reactive to light  No scleral icterus. Extraocular muscles intact.  HEENT: Head atraumatic, normocephalic. Oropharynx and nasopharynx clear.  NECK:  Supple, no jugular venous distention. No thyroid enlargement, no tenderness.  LUNGS: Normal breath sounds bilaterally, no wheezing, rales,rhonchi or crepitation. No use of accessory muscles of respiration.  CARDIOVASCULAR: S1, S2 normal. No murmurs, rubs, or gallops.  ABDOMEN: Soft, nontender, nondistended. Bowel sounds present. No  organomegaly or mass.  EXTREMITIES: No pedal edema, cyanosis, or clubbing.  NEUROLOGIC: Cranial nerves II through XII are intact. Muscle strength 5/5 in all extremities. Sensation intact. Gait not checked.  PSYCHIATRIC: The patient is alert and oriented x 3.  SKIN: No obvious rash, lesion, or ulcer.   Physical Exam LABORATORY PANEL:   CBC Recent Labs  Lab 11/14/17 0528  WBC 6.2  HGB 8.1*  HCT 24.8*  PLT 141*   ------------------------------------------------------------------------------------------------------------------  Chemistries  Recent Labs  Lab 11/12/17 1112  11/14/17 0528  NA  --    < > 137  K  --    < > 4.1  CL  --    < > 98*  CO2  --    < > 32  GLUCOSE  --    < > 92  BUN  --    < > 27*  CREATININE  --    < > 3.13*  CALCIUM  --    < > 7.8*  AST 16  --   --   ALT 10*  --   --   ALKPHOS 66  --   --   BILITOT 0.5  --   --    < > = values in this interval not displayed.   ------------------------------------------------------------------------------------------------------------------  Cardiac Enzymes Recent Labs  Lab 11/10/17 1542 11/11/17 0454  TROPONINI <0.03 <0.03   ------------------------------------------------------------------------------------------------------------------  RADIOLOGY:  No results found.  ASSESSMENT AND PLAN:  [name] a71 y.o.femalewith a known history of hypertension, CKD stage5,CAD status post stents, GERD, bilateral lower extremity edema, chronic iron deficiency anemia,seizure disorder presents to hospital secondary to worsening shortness of breath secondary  to newly diagnosed end-stage renal disease, awaiting temporary IJ hemodialysis catheter-plan for Monday.  1acute end-stage renal disease Stable ForIJ tunnel dialysis catheter placement today [place]rology [place] son also goes  for HD  2acute hyperkalemia Secondary to above Resolved withcalcium gluconate, kayexalate,IV dextrose/insulin, and hemodialysis  3acute hypoxic respiratory failure Resolved with hemodialysis, did require 1 night in the ICU on BiPAP Secondary to acute fluid overload state from newly diagnosed end-stage renal disease  4acute on chronic anemia most likely secondary to end-stage renal disease S/p blood transfusion while in house and did receive Epogen  5chronic GERD without esophagitis  PPI daily  6acute on chronic seizure disorder, unspecified Stable Patient transferred from floor to ICU for1 night, seen by neurology-Depakotelevel repeat 29 after HD another 500 VPA given after HD, to continueDepakote500 TID and 500 post each HD, continue seizure precautions, as needed Ativan for seizures  All the records are reviewed and case discussed with Care Management/Social Workerr. Management plans discussed with the patient, family and they are in agreement.  CODE STATUS: full TOTAL TIME TAKING CARE OF THIS PATIENT: 45 minutes.     POSSIBLE D/C IN 1-2 DAYS, DEPENDING ON CLINICAL CONDITION.   Sarah Hamilton M.D on 11/17/2017   Between 7am to 6pm - Pager - [phone]  After 6pm go to [email or web address] - password [email or web address] [facility]  Office  [phone]  CC: Primary care physician; System, Pcp Not In  Note: This dictation was prepared with Dragon dictation along with smaller phrase technology. Any transcriptional errors that result from this process are unintentional.

## 2017-11-17 NOTE — Op Note (Signed)
OPERATIVE NOTE    PRE-OPERATIVE DIAGNOSIS: 1. ESRD   POST-OPERATIVE DIAGNOSIS: same as above  PROCEDURE: 1. Ultrasound guidance for vascular access to the right internal jugular vein 2. Fluoroscopic guidance for placement of catheter 3. Placement of a 19 cm tip to cuff tunneled hemodialysis catheter via the right internal jugular vein  SURGEON: Leotis Pain, MD  ANESTHESIA:  Local with Moderate conscious sedation for approximately 15 minutes using 1.5 mg of Versed and 100 mcg of Fentanyl  ESTIMATED BLOOD LOSS: 15 cc  FLUORO TIME: less than one minute  CONTRAST: none  FINDING(S): 1.  Patent right internal jugular vein  SPECIMEN(S):  None  INDICATIONS:   Sarah Hamilton is a 72 y.o.female who presents with renal failure.  The patient needs long term dialysis access for their ESRD, and a Permcath is necessary.  Risks and benefits are discussed and informed consent is obtained.    DESCRIPTION: After obtaining full informed written consent, the patient was brought back to the vascular suited. The patient's right neck and chest were sterilely prepped and draped in a sterile surgical field was created. Moderate conscious sedation was administered during a face to face encounter with the patient throughout the procedure with my supervision of the RN administering medicines and monitoring the patient's vital signs, pulse oximetry, telemetry and mental status throughout from the start of the procedure until the patient was taken to the recovery room.  The right internal jugular vein was visualized with ultrasound and found to be patent. It was then accessed under direct ultrasound guidance and a permanent image was recorded. A wire was placed. After skin nick and dilatation, the peel-away sheath was placed over the wire. I then turned my attention to an area under the clavicle. Approximately 1-2 fingerbreadths below the clavicle a small counterincision was created and tunneled from the  subclavicular incision to the access site. Using fluoroscopic guidance, a 19 centimeter tip to cuff tunneled hemodialysis catheter was selected, and tunneled from the subclavicular incision to the access site. It was then placed through the peel-away sheath and the peel-away sheath was removed. Using fluoroscopic guidance the catheter tips were parked in the right atrium. The appropriate distal connectors were placed. It withdrew blood well and flushed easily with heparinized saline and a concentrated heparin solution was then placed. It was secured to the chest wall with 2 Prolene sutures. The access incision was closed single 4-0 Monocryl. A 4-0 Monocryl pursestring suture was placed around the exit site. Sterile dressings were placed. The patient tolerated the procedure well and was taken to the recovery room in stable condition.  COMPLICATIONS: None  CONDITION: Stable  Leotis Pain, MD 11/17/2017 4:59 PM   This note was created with Dragon Medical transcription system. Any errors in dictation are purely unintentional.

## 2017-11-17 NOTE — Progress Notes (Signed)
Post dialysis assessment 

## 2017-11-18 ENCOUNTER — Encounter: Payer: Self-pay | Admitting: Vascular Surgery

## 2017-11-18 LAB — GLUCOSE, CAPILLARY
Glucose-Capillary: 121 mg/dL — ABNORMAL HIGH (ref 65–99)
Glucose-Capillary: 84 mg/dL (ref 65–99)
Glucose-Capillary: 94 mg/dL (ref 65–99)

## 2017-11-18 LAB — CBC
HEMATOCRIT: 25.2 % — AB (ref 35.0–47.0)
HEMOGLOBIN: 8.2 g/dL — AB (ref 12.0–16.0)
MCH: 31.3 pg (ref 26.0–34.0)
MCHC: 32.6 g/dL (ref 32.0–36.0)
MCV: 96 fL (ref 80.0–100.0)
Platelets: 156 10*3/uL (ref 150–440)
RBC: 2.62 MIL/uL — ABNORMAL LOW (ref 3.80–5.20)
RDW: 16.2 % — AB (ref 11.5–14.5)
WBC: 4.4 10*3/uL (ref 3.6–11.0)

## 2017-11-18 MED ORDER — OXYCODONE HCL 5 MG PO TABS: 5.0000 mg | ORAL_TABLET | Freq: Four times a day (QID) | ORAL | 0 refills | Status: DC | PRN

## 2017-11-18 MED ORDER — VALPROIC ACID 250 MG PO CAPS: 500.0000 mg | ORAL_CAPSULE | ORAL | Status: DC

## 2017-11-18 MED ORDER — VALPROIC ACID 250 MG PO CAPS: 500.0000 mg | ORAL_CAPSULE | Freq: Three times a day (TID) | ORAL | 0 refills | Status: DC

## 2017-11-18 MED ORDER — NEPRO/CARBSTEADY PO LIQD: 237.0000 mL | Freq: Two times a day (BID) | ORAL | 0 refills | Status: DC

## 2017-11-18 MED ORDER — ISOSORBIDE MONONITRATE ER 60 MG PO TB24: 60.0000 mg | ORAL_TABLET | Freq: Every evening | ORAL | 0 refills | Status: DC

## 2017-11-18 MED ORDER — LACTULOSE 10 GM/15ML PO SOLN: 30.0000 g | Freq: Two times a day (BID) | ORAL | 0 refills | Status: DC

## 2017-11-18 MED ORDER — CARVEDILOL 3.125 MG PO TABS: 3.1250 mg | ORAL_TABLET | Freq: Two times a day (BID) | ORAL | 0 refills | Status: AC

## 2017-11-18 MED ORDER — VALPROIC ACID 250 MG PO CAPS: 500.0000 mg | ORAL_CAPSULE | ORAL | 0 refills | Status: DC

## 2017-11-18 MED ORDER — IPRATROPIUM-ALBUTEROL 0.5-2.5 (3) MG/3ML IN SOLN: 3.0000 mL | RESPIRATORY_TRACT | 0 refills | Status: DC | PRN

## 2017-11-18 NOTE — Clinical Social Work Placement (Signed)
   CLINICAL SOCIAL WORK PLACEMENT  NOTE  Date:  11/18/2017  Patient Details  Name: Sarah Hamilton MRN: [id number] Date of Birth: 09-06-1946  Clinical Social Work is seeking post-discharge placement for this patient at the [place] level of care (*CSW will initial, date and re-position this form in  chart as items are completed):  Yes   Patient/family provided with [facility] Work Department's list of facilities offering this level of care within the geographic area requested by the patient (or if unable, by the patient's family).  Yes   Patient/family informed of their freedom to choose among providers that offer the needed level of care, that participate in Medicare, Medicaid or managed care program needed by the patient, have an available bed and are willing to accept the patient.  Yes   Patient/family informed of [facility]'s ownership interest in [facility] and [facility], as well as of the fact that they are under no obligation to receive care at these facilities.  PASRR submitted to EDS on       PASRR number received on       Existing PASRR number confirmed on 11/14/17     FL2 transmitted to all facilities in geographic area requested by pt/family on 11/14/17     FL2 transmitted to all facilities within larger geographic area on       Patient informed that his/her managed care company has contracts with or will negotiate with certain facilities, including the following:        Yes   Patient/family informed of bed offers received.  Patient [facility] bed at [facility])     Physician recommends and patient chooses bed at [facility])    Patient to be transferred to ([facility]) on 11/18/17.  Patient to be transferred to facility by (EMS)     Patient family notified on 11/18/17 of transfer.  Name of family member notified:  (Dr. Stann Mainland: patient's daughter)     PHYSICIAN       Additional Comment:     _______________________________________________ Shela Leff, LCSW 11/18/2017, 11:42 AM

## 2017-11-18 NOTE — Clinical Social Work Note (Signed)
CSW received call from patient's daughter: Dr. Stann Mainland: [phone]. Dr. Stann Mainland wanted to relay that she is working to disenroll patient from the [facility] program as she has not been satisfied with how they have managed patient's care prior this hospitalization. She stated she spoke with Dr. Ovid Curd at [facility] and informed her that she is going to try and work on getting her mother closer to her in [place] at a facility called: [facility]. She requested I send patient's referral information to [facility] their admission's coordinator: [phone] ext: [phone] and fax: [phone]. She had wanted to see if her mother could be transferred from here to there on this admission. CSW informed Dr. Stann Mainland that patient was being discharge today and also informed her that it would take several days for patient's hemodialysis to be changed to the [place] area. Dr. Stann Mainland states she had forgotten about the dialysis and agreed for patient to go to [facility] today with [facility] transporting. CSW sent patient's referral to [facility] and left her a message as well in the event she has any questions she can call me. CSW has notified Broadus John at [facility] and sent discharge information. Shela Leff MSW,LcSW [phone]

## 2017-11-18 NOTE — Progress Notes (Signed)
Pt prepared for d/c to [facility]. Midline d/c'd. Skin intact except as charted in most recent assessments. Vitals are stable. Report given to [place] at [facility] [employer]. Pt to be transported by [facility]. RN and NT used lift to get pt into wheelchair. Pt tolerated transportation well. Discharge paperwork and prescriptions were given to [facility] upon discharge.   Sarely Stracener [employer]

## 2017-11-18 NOTE — Discharge Summary (Signed)
Sarah Hamilton, is a 72 y.o. female  DOB 01/12/1946  MRN [id number].  Admission date:  11/10/2017  Admitting Physician  Gladstone Lighter, MD  Discharge Date:  11/18/2017   Primary MD  System, Pcp Not In  Recommendations for primary care physician for things to follow:  [facility]-up [facility] as needed Patient scheduled to have dialysis at present he has and [place] on Monday, Wednesday, Friday at 10 AM   Admission Diagnosis  Hyperkalemia [E87.5] Anemia, chronic renal failure, stage 4 (severe) (HCC) [N18.4, D63.1] Acute renal failure superimposed on chronic kidney disease, unspecified CKD stage, unspecified acute renal failure type ([place]) [N17.9, N18.9] Acute on chronic congestive heart failure, unspecified heart failure type ([place]) [I50.9]   Discharge Diagnosis  Hyperkalemia [E87.5] Anemia, chronic renal failure, stage 4 (severe) ([place]) [N18.4, D63.1] Acute renal failure superimposed on chronic kidney disease, unspecified CKD stage, unspecified acute renal failure type ([place]) [N17.9, N18.9] Acute on chronic congestive heart failure, unspecified heart failure type ([place]) [I50.9]    Active Problems:   Acute pulmonary edema (HCC)      Past Medical History:  Diagnosis Date  . Anemia   . Anginal pain ([place])   . Arthritis   . CHF (congestive heart failure) ([place])   . CKD (chronic kidney disease), stage IV ([place])   . Coronary artery disease   . Dementia   . Diverticulitis   . Diverticulosis   . Dysphagia   . Dyspnea   . Edema    FEET/LEGS  . GERD (gastroesophageal reflux disease)   . Gout   . Heart murmur   . Hepatitis    B  . Hyperlipidemia   . Hyperparathyroidism ([place])   . Hypertension   . Hypothyroidism   . Orthopnea   . Pain    CHRONIC PAIN SYNDROME  . Palpitations   . Seizures ([place])     last seizure end of November 2018  . Spinal stenosis   . Tremor    ESSENTIAL  . Wheezing     Past Surgical History:  Procedure Laterality Date  . ABDOMINAL HYSTERECTOMY    . AV FISTULA PLACEMENT Right 10/16/2017   Procedure: ARTERIOVENOUS (AV) FISTULA CREATION (BRACHIOCEPHALIC);  Surgeon: Algernon Huxley, MD;  Location: [facility] ORS;  Service: Vascular;  Laterality: Right;  . CATARACT EXTRACTION W/PHACO Left 11/07/2016   Procedure: CATARACT EXTRACTION PHACO AND INTRAOCULAR LENS PLACEMENT (IOC);  Surgeon: Eulogio Bear, MD;  Location: [facility] ORS;  Service: Ophthalmology;  Laterality: Left;  Lot # [id number] H [place]: 00:38.0 AP%: 10.1 CDE: 3.86  . CATARACT EXTRACTION W/PHACO Right 01/09/2017   Procedure: CATARACT EXTRACTION PHACO AND INTRAOCULAR LENS PLACEMENT (IOC);  Surgeon: Eulogio Bear, MD;  Location: [facility] ORS;  Service: Ophthalmology;  Laterality: Right;  [place] 54.8 AP% 5.4 CDE 2.96 Fluid pack lot # [id number]  . CHEST SURGERY     BENIGN TUMOR  . CHOLECYSTECTOMY    . CORONARY ANGIOPLASTY     STENT  . DIALYSIS/PERMA CATHETER INSERTION N/A 11/11/2017   Procedure: DIALYSIS/PERMA CATHETER INSERTION;  Surgeon: Katha Cabal, MD;  Location: [facility] CV LAB;  Service: Cardiovascular;  Laterality: N/A;  . DIALYSIS/PERMA CATHETER INSERTION N/A 11/17/2017   Procedure: DIALYSIS/PERMA CATHETER INSERTION;  Surgeon: Algernon Huxley, MD;  Location: [facility] [place] [facility];  Service: Cardiovascular;  Laterality: N/A;  . EYE SURGERY    . JOINT REPLACEMENT Bilateral    TKR  . SHOULDER SURGERY Left    shoulder replacement  . ULTRASOUND GUIDANCE  FOR VASCULAR ACCESS  10/16/2017   Procedure: ULTRASOUND GUIDANCE FOR MICROPUNCTURE VENOUS ACCESS;  Surgeon: Algernon Huxley, MD;  Location: [facility] ORS;  Service: Vascular;;       History of present illness and  Hospital Course:     Kindly see H&P for history of present illness and admission details, please review complete Labs, Consult reports and Test reports for  all details in brief  HPI  from the history and physical done on the day of admission 72 year old female patient admitted on January 14 for shortness of breath.  Patient has history of CKD stage V, CAD, GERD, seizure disorder came in because of worsening shortness of breath, got progressive worsening of shortness of breath for 2 weeks associated with lower extremity edema, weight gain.  Found to have worsening creatinine from 3.8-4.7.  And also found to have potassium 6.3 on admission.  She had lots of fatigue, nausea, shortness of breath, generalized weakness.   Hospital Course  #1 severe hyperkalemia, secondary to acute on chronic renal failure.  Received calcium gluconate, Kayexalate, admitted to then transfer to telemetry.  Patient received temporary hemodialysis catheter.  #2 acute on chronic respiratory failure secondary to severe pulmonary edema from worsening kidney disease.  Nephrology consulted, patient had temporary hemodialysis catheter for initiation of hemodialysis.  Received IV Lasix, started on hemodialysis.  Patient received hemodialysis  CKD stage V with severe hyperkalemia and now progressed to ESRD, noted on hemodialysis on January 15, patient received several dialysis treatments since admission, resulted in significant clinical improvement, no shortness of breath.  But she feels really weak.  Temporary dialysis catheter removed, patient received  permacath yesterday.   3.  Gout: Continue Uloric. 4.  Acute hypoxic respiratory failure: Initially received BiPAP, patient monitored in ICU.  Now she is off the BiPAP, she is on room air. 5.  Acute on chronic anemia due to ESRD.  Received 1 unit of packed RBC transfusion, she is on Neupogen. 6.  Acute chronic seizure disorder: Seen by neurology, patient is on Depakote, continue Depakote 500 mg 3 times daily, 500 post each hemodialysis. #7 essential hypertension: Controlled, continue Coreg, Lasix, Norvasc Discussed with patient's  daughter over the phone.  Discharge Condition:    Follow UP      Discharge Instructions  and  Discharge Medications   Stable   Allergies as of 11/18/2017      Reactions   Pregabalin Other (See Comments)   Sleepy   Azithromycin Other (See Comments)   Unknown   Cymbalta [duloxetine Hcl] Other (See Comments)   Per MAR   Erythromycin Hives      Medication List    STOP taking these medications   divalproex 250 MG 24 hr tablet Commonly known as:  DEPAKOTE ER   divalproex 500 MG 24 hr tablet Commonly known as:  DEPAKOTE ER   oxyCODONE-acetaminophen 5-325 MG tablet Commonly known as:  PERCOCET/ROXICET   sodium bicarbonate 650 MG tablet   sodium polystyrene powder Commonly known as:  KAYEXALATE   VOLTAREN 1 % Gel Generic drug:  diclofenac sodium     TAKE these medications   acetaminophen 325 MG tablet Commonly known as:  TYLENOL Take 325-650 mg by mouth every 8 (eight) hours as needed for moderate pain.   amLODipine 5 MG tablet Commonly known as:  NORVASC Take 5 mg by mouth daily.   aspirin EC 81 MG tablet Take 81 mg by mouth daily.   atorvastatin 20 MG tablet Commonly known as:  LIPITOR  Take 20 mg by mouth at bedtime.   camphor-menthol lotion Commonly known as:  SARNA Apply 1 application topically 2 (two) times daily as needed for itching.   carvedilol 3.125 MG tablet Commonly known as:  COREG Take 1 tablet (3.125 mg total) by mouth 2 (two) times daily with a meal. What changed:    medication strength  how much to take  when to take this   feeding supplement (NEPRO CARB STEADY) Liqd Take 237 mLs by mouth 2 (two) times daily between meals.   furosemide 40 MG tablet Commonly known as:  LASIX Take 80 mg by mouth daily.   ipratropium-albuterol 0.5-2.5 (3) MG/3ML Soln Commonly known as:  DUONEB Take 3 mLs by nebulization every 4 (four) hours as needed.   isosorbide mononitrate 60 MG 24 hr tablet Commonly known as:  IMDUR Take 1 tablet (60  mg total) by mouth every evening. What changed:  when to take this   lactulose 10 GM/15ML solution Commonly known as:  CHRONULAC Take 45 mLs (30 g total) by mouth 2 (two) times daily.   naloxone 4 MG/0.1ML Liqd nasal spray kit Commonly known as:  NARCAN Place 1 spray into the nose once.   NITROSTAT 0.4 MG SL tablet Generic drug:  nitroGLYCERIN Place 0.4 mg under the tongue every 5 (five) minutes as needed for chest pain.   nystatin powder Generic drug:  nystatin Apply 1 g topically 2 (two) times daily.   ondansetron 4 MG disintegrating tablet Commonly known as:  ZOFRAN-ODT Take 4 mg by mouth daily as needed for nausea or vomiting.   oxyCODONE 5 MG immediate release tablet Commonly known as:  Oxy IR/ROXICODONE Take 1-2 tablets (5-10 mg total) by mouth every 6 (six) hours as needed for moderate pain or severe pain.   pantoprazole 40 MG tablet Commonly known as:  PROTONIX Take 1 tablet (40 mg total) by mouth daily.   promethazine 12.5 MG tablet Commonly known as:  PHENERGAN Take 12.5 mg by mouth every 6 (six) hours as needed for nausea or vomiting.   senna-docusate 8.6-50 MG tablet Commonly known as:  Senokot-S Take 2 tablets by mouth daily as needed for mild constipation.   sevelamer 800 MG tablet Commonly known as:  RENAGEL Take 800 mg by mouth 3 (three) times daily with meals.   ULORIC 40 MG tablet Generic drug:  febuxostat Take 40 mg by mouth daily.   valproic acid 250 MG capsule Commonly known as:  DEPAKENE Take 2 capsules (500 mg total) by mouth 3 (three) times daily.   valproic acid 250 MG capsule Commonly known as:  DEPAKENE Take 2 capsules (500 mg total) by mouth every Monday, Wednesday, and Friday at 6 PM. Start taking on:  11/19/2017   Vitamin D3 1000 units Caps Take 1,000 Units by mouth daily.         Diet and Activity recommendation: See Discharge Instructions above   Consults obtained -nephrology, vascular surgery, physical  therapy,   Major procedures and Radiology Reports - PLEASE review detailed and final reports for all details, in brief -      Dg Chest 2 View  Result Date: 11/10/2017 CLINICAL DATA:  15 pound weight gain during the past week. History of end-stage renal disease. EXAM: CHEST  2 VIEW COMPARISON:  01/14/2016 FINDINGS: Examination is degraded due to patient body habitus. Grossly unchanged enlarged cardiac silhouette and mediastinal contours given persistently reduced lung volumes. Atherosclerotic plaque within the thoracic aorta. Mild pulmonary venous congestion without frank evidence  of edema. Bibasilar heterogeneous opacities are unchanged and favored to represent atelectasis. No new focal airspace opacities. No pleural effusion or pneumothorax. No definite acute osseus abnormalities. Accentuated thoracic kyphosis. Post left hemi humeral arthroplasty, incompletely evaluated. IMPRESSION: Similar findings of cardiomegaly and pulmonary venous congestion without definitive superimposed acute cardiopulmonary disease on this hypoventilated, body habitus degraded examination. Specifically, no definite evidence of pulmonary edema. Electronically Signed   By: Sandi Mariscal M.D.   On: 11/10/2017 16:26    Micro Results     Recent Results (from the past 240 hour(s))  MRSA PCR Screening     Status: None   Collection Time: 11/10/17  9:41 PM  Result Value Ref Range Status   MRSA by PCR NEGATIVE NEGATIVE Final    Comment:        The GeneXpert MRSA Assay (FDA approved for NASAL specimens only), is one component of a comprehensive MRSA colonization surveillance program. It is not intended to diagnose MRSA infection nor to guide or monitor treatment for MRSA infections. Performed at [facility], [address] [place], [place] [address]        Today   Subjective:   Sarah Hamilton stable for discharge today spoke to patient's daughter over the phone, she wants her mom to make the move  close to her place in [facility] and arrangements for hemodialysis are made at [facility].  Objective:   Blood pressure (!) 145/51, pulse 82, temperature 98.3 F (36.8 C), temperature source Oral, resp. rate (!) 22, height '5\' 2"'$  (1.575 m), weight 134.5 kg (296 lb 8 oz), SpO2 96 %.   Intake/Output Summary (Last 24 hours) at 11/18/2017 1104 Last data filed at 11/18/2017 1009 Gross per 24 hour  Intake 240 ml  Output 4000 ml  Net -3760 ml    Exam Awake Alert, Oriented x 3, No new F.N deficits, Normal affect [place].AT,PERRAL Supple Neck,No JVD, No cervical lymphadenopathy appriciated.  Symmetrical Chest wall movement, Good air movement bilaterally, CTAB RRR,No Gallops,Rubs or new Murmurs, No Parasternal Heave +ve B.Sounds, Abd Soft, Non tender, No organomegaly appriciated, No rebound -guarding or rigidity. No Cyanosis, Clubbing or edema, No new Rash or bruise  Data Review   CBC w Diff:  Lab Results  Component Value Date   WBC 4.4 11/17/2017   HGB 8.2 (L) 11/17/2017   HCT 25.2 (L) 11/17/2017   PLT 156 11/17/2017   LYMPHOPCT 22 11/12/2017   MONOPCT 16 11/12/2017   EOSPCT 5 11/12/2017   BASOPCT 0 11/12/2017    CMP:  Lab Results  Component Value Date   NA 137 11/14/2017   K 4.1 11/14/2017   CL 98 (L) 11/14/2017   CO2 32 11/14/2017   BUN 27 (H) 11/14/2017   CREATININE 3.13 (H) 11/14/2017   PROT 6.0 (L) 11/12/2017   ALBUMIN 2.4 (L) 11/13/2017   BILITOT 0.5 11/12/2017   ALKPHOS 66 11/12/2017   AST 16 11/12/2017   ALT 10 (L) 11/12/2017  .   Total Time in preparing paper work, data evaluation and todays exam - 35 minutes  Epifanio Lesches M.D on 11/18/2017 at 11:04 AM    Note: This dictation was prepared with Dragon dictation along with smaller phrase technology. Any transcriptional errors that result from this process are unintentional.

## 2017-11-18 NOTE — Progress Notes (Signed)
[facility], [place] 11/18/17  Subjective:  Right IJ PermCath has been placed. Patient underwent hemodialysis yesterday. Outpatient dialysis has been set up.   Objective:  Vital signs in last 24 hours:  Temp:  [98.1 F (36.7 C)-98.5 F (36.9 C)] 98.3 F (36.8 C) (01/22 0535) Pulse Rate:  [73-94] 82 (01/22 0535) Resp:  [14-26] 22 (01/22 0535) BP: (112-191)/(51-103) 145/51 (01/22 0535) SpO2:  [94 %-100 %] 96 % (01/22 0535) Weight:  [134.5 kg (296 lb 8 oz)] 134.5 kg (296 lb 8 oz) (01/21 1541)  Weight change: 0 kg (0 lb) Filed Weights   11/16/17 0620 11/17/17 0500 11/17/17 1541  Weight: (!) 136.3 kg (300 lb 6.4 oz) 134.5 kg (296 lb 8 oz) 134.5 kg (296 lb 8 oz)    Intake/Output:    Intake/Output Summary (Last 24 hours) at 11/18/2017 1145 Last data filed at 11/18/2017 1009 Gross per 24 hour  Intake 240 ml  Output 4000 ml  Net -3760 ml     Physical Exam: General:  Chronically ill-appearing, resting comfortably.   HEENT  moist oral mucous membranes  Neck  supple  Pulm/lungs  room air, clear to auscultation.   CVS/Heart  regular, no rub  Abdomen:   Soft, nontender   Extremities:  2+ pitting edema bilaterally  Neurologic:  Alert, able to follow commands  Skin:  Dry skin  Access:  Right arm developing brachiocephalic fistula        Basic Metabolic Panel:  Recent Labs  Lab 11/12/17 2207 11/13/17 0442 11/14/17 0528  NA 138 135 137  K 4.5 4.3 4.1  CL 100* 98* 98*  CO2 27 30 32  GLUCOSE 109* 97 92  BUN 35* 40* 27*  CREATININE 3.49* 3.43* 3.13*  CALCIUM 7.5* 7.5* 7.8*  PHOS  --  5.2*  --      CBC: Recent Labs  Lab 11/12/17 2207 11/13/17 0442 11/14/17 0528 11/17/17 1628  WBC 5.7 6.0 6.2 4.4  NEUTROABS 3.2  --   --   --   HGB 8.5* 8.4* 8.1* 8.2*  HCT 25.8* 25.7* 24.8* 25.2*  MCV 94.0 95.1 94.5 96.0  PLT 152 144* 141* 156      Lab Results  Component Value Date   HEPBSAG Negative 11/11/2017   HEPBSAB Reactive 11/11/2017       Microbiology:  Recent Results (from the past 240 hour(s))  MRSA PCR Screening     Status: None   Collection Time: 11/10/17  9:41 PM  Result Value Ref Range Status   MRSA by PCR NEGATIVE NEGATIVE Final    Comment:        The GeneXpert MRSA Assay (FDA approved for NASAL specimens only), is one component of a comprehensive MRSA colonization surveillance program. It is not intended to diagnose MRSA infection nor to guide or monitor treatment for MRSA infections. Performed at [facility], [address] [place], [place] [address]     Coagulation Studies: No results for input(s): LABPROT, INR in the last 72 hours.  Urinalysis: No results for input(s): COLORURINE, LABSPEC, PHURINE, GLUCOSEU, HGBUR, BILIRUBINUR, KETONESUR, PROTEINUR, UROBILINOGEN, NITRITE, LEUKOCYTESUR in the last 72 hours.  Invalid input(s): APPERANCEUR    Imaging: No results found.   Medications:    . atorvastatin  20 mg Oral QHS  . carvedilol  3.125 mg Oral BID  . cholecalciferol  1,000 Units Oral Daily  . febuxostat  40 mg Oral Daily  . feeding supplement (NEPRO CARB STEADY)  237 mL Oral BID  BM  . heparin  5,000 Units Subcutaneous Q8H  . isosorbide mononitrate  60 mg Oral QPM  . lactulose  30 g Oral BID  . multivitamin  1 tablet Oral QHS  . pantoprazole  40 mg Oral Daily  . senna-docusate  2 tablet Oral QHS  . sevelamer carbonate  800 mg Oral TID WC  . sodium chloride flush  10-40 mL Intracatheter Q12H  . torsemide  100 mg Oral Daily  . valproic acid  500 mg Oral TID  . [START ON 11/19/2017] valproic acid  500 mg Oral Q M,W,F-1800   acetaminophen **OR** acetaminophen, hydrALAZINE, ipratropium-albuterol, LORazepam, nitroGLYCERIN, [DISCONTINUED] ondansetron **OR** ondansetron (ZOFRAN) IV, oxyCODONE, sodium chloride flush  Assessment/ Plan:  72 y.o. African-American female with severe hypertension, chronic kidney disease, obesity gout, was admitted on 11/10/2017 with acute  shortness of breath and volume overload.   1.  ESRD   2.  Acute pulmonary edema 3.  Anemia of chronic kidney disease 4.  Lower extremity edema 5.  Hyperkalemia  Dialysis was initiated on January 15 She has had several dialysis treatments which has resulted in significant clinical improvement. Potassium level is now normal   Plan:  PermCath was placed successfully yesterday.  She did undergo hemodialysis yesterday as well.  Her outpatient dialysis has been set for [facility]  She will be followed by nephrology while there.  No acute indication for dialysis today.  Point stimulating agent therapy should also be considered as an outpatient.  Otherwise no additional input at this time.   LOS: [place] 1/22/201911:45 AM  [address] [place], [place]

## 2017-11-20 ENCOUNTER — Ambulatory Visit (INDEPENDENT_AMBULATORY_CARE_PROVIDER_SITE_OTHER): Payer: Medicare (Managed Care) | Admitting: Vascular Surgery

## 2018-03-03 ENCOUNTER — Encounter (INDEPENDENT_AMBULATORY_CARE_PROVIDER_SITE_OTHER): Payer: Self-pay

## 2018-03-03 ENCOUNTER — Encounter (INDEPENDENT_AMBULATORY_CARE_PROVIDER_SITE_OTHER): Payer: Self-pay | Admitting: Vascular Surgery

## 2018-03-03 ENCOUNTER — Other Ambulatory Visit (INDEPENDENT_AMBULATORY_CARE_PROVIDER_SITE_OTHER): Payer: Self-pay | Admitting: Vascular Surgery

## 2018-03-03 ENCOUNTER — Ambulatory Visit (INDEPENDENT_AMBULATORY_CARE_PROVIDER_SITE_OTHER): Payer: Medicare (Managed Care) | Admitting: Vascular Surgery

## 2018-03-03 VITALS — BP 151/81 | HR 82 | Resp 18 | Ht 64.0 in | Wt 278.0 lb

## 2018-03-03 DIAGNOSIS — Z992 Dependence on renal dialysis: Secondary | ICD-10-CM

## 2018-03-03 DIAGNOSIS — E785 Hyperlipidemia, unspecified: Secondary | ICD-10-CM | POA: Diagnosis not present

## 2018-03-03 DIAGNOSIS — I10 Essential (primary) hypertension: Secondary | ICD-10-CM

## 2018-03-03 DIAGNOSIS — N186 End stage renal disease: Secondary | ICD-10-CM | POA: Diagnosis not present

## 2018-03-03 NOTE — Assessment & Plan Note (Signed)
lipid control important in reducing the progression of atherosclerotic disease. Continue statin therapy  

## 2018-03-03 NOTE — Progress Notes (Signed)
MRN : [id number]  Sarah Hamilton is a 72 y.o. (06/05/1946) female who presents with chief complaint of  Chief Complaint  Patient presents with  . Follow-up    Discuss Surgery  .  History of Present Illness: Patient returns today in follow up of her dialysis access.  She had a first stage right brachiobasilic AV fistula placed about 4 months ago.  At the time, she was not yet on dialysis.  She has since started dialysis via catheter placed about 2 to 3 months ago.  At this point, her catheter is working well but she has not yet had her basilic transposition.  She has a very large arm and her basilic vein is quite deep and the fistula would not be accessible at the current location.  Current Outpatient Medications  Medication Sig Dispense Refill  . acetaminophen (TYLENOL) 325 MG tablet Take 325-650 mg by mouth every 8 (eight) hours as needed for moderate pain.    Marland Kitchen amLODipine (NORVASC) 5 MG tablet Take 5 mg by mouth daily.    Marland Kitchen aspirin EC 81 MG tablet Take 81 mg by mouth daily.    Marland Kitchen atorvastatin (LIPITOR) 20 MG tablet Take 20 mg by mouth at bedtime.    . camphor-menthol (SARNA) lotion Apply 1 application topically 2 (two) times daily as needed for itching.    . carvedilol (COREG) 3.125 MG tablet Take 1 tablet (3.125 mg total) by mouth 2 (two) times daily with a meal. 30 tablet 0  . Cholecalciferol (VITAMIN D3) 1000 units CAPS Take 1,000 Units by mouth daily.    . febuxostat (ULORIC) 40 MG tablet Take 40 mg by mouth daily.    . furosemide (LASIX) 40 MG tablet Take 80 mg by mouth daily.    Marland Kitchen gabapentin (NEURONTIN) 100 MG capsule Take by mouth.    Marland Kitchen ipratropium-albuterol (DUONEB) 0.5-2.5 (3) MG/3ML SOLN Take 3 mLs by nebulization every 4 (four) hours as needed. 360 mL 0  . isosorbide mononitrate (IMDUR) 60 MG 24 hr tablet Take 1 tablet (60 mg total) by mouth every evening. 30 tablet 0  . lactulose (CHRONULAC) 10 GM/15ML solution Take 45 mLs (30 g total) by mouth 2 (two) times daily. 240 mL 0   . levETIRAcetam (KEPPRA) 500 MG tablet Take by mouth.    . meclizine (ANTIVERT) 25 MG tablet Take by mouth.    . naloxone (NARCAN) nasal spray 4 mg/0.1 mL Place 1 spray into the nose once.     . nitroGLYCERIN (NITROSTAT) 0.4 MG SL tablet Place 0.4 mg under the tongue every 5 (five) minutes as needed for chest pain.     . Nutritional Supplements (FEEDING SUPPLEMENT, NEPRO CARB STEADY,) LIQD Take 237 mLs by mouth 2 (two) times daily between meals. 60 Can 0  . nystatin (NYSTATIN) powder Apply 1 g topically 2 (two) times daily.    . ondansetron (ZOFRAN-ODT) 4 MG disintegrating tablet Take 4 mg by mouth daily as needed for nausea or vomiting.    Marland Kitchen oxyCODONE (OXY IR/ROXICODONE) 5 MG immediate release tablet Take 1-2 tablets (5-10 mg total) by mouth every 6 (six) hours as needed for moderate pain or severe pain. 30 tablet 0  . promethazine (PHENERGAN) 12.5 MG tablet Take 12.5 mg by mouth every 6 (six) hours as needed for nausea or vomiting.     . senna-docusate (SENOKOT-S) 8.6-50 MG tablet Take 2 tablets by mouth daily as needed for mild constipation.     . sevelamer (RENAGEL) 800 MG tablet Take  800 mg by mouth 3 (three) times daily with meals.    . sodium bicarbonate 650 MG tablet Take by mouth.    Marland Kitchen tiZANidine (ZANAFLEX) 2 MG tablet Take by mouth.    . valproic acid (DEPAKENE) 250 MG capsule Take 2 capsules (500 mg total) by mouth every Monday, Wednesday, and Friday at 6 PM. 30 capsule 0  . valproic acid (DEPAKENE) 250 MG capsule Take 2 capsules (500 mg total) by mouth 3 (three) times daily. 30 capsule 0  . pantoprazole (PROTONIX) 40 MG tablet Take 1 tablet (40 mg total) by mouth daily. 30 tablet 0   No current facility-administered medications for this visit.     Past Medical History:  Diagnosis Date  . Anemia   . Anginal pain ([place])   . Arthritis   . CHF (congestive heart failure) ([place])   . CKD (chronic kidney disease), stage IV ([place])   . Coronary artery disease   . Dementia   .  Diverticulitis   . Diverticulosis   . Dysphagia   . Dyspnea   . Edema    FEET/LEGS  . GERD (gastroesophageal reflux disease)   . Gout   . Heart murmur   . Hepatitis    B  . Hyperlipidemia   . Hyperparathyroidism ([place])   . Hypertension   . Hypothyroidism   . Orthopnea   . Pain    CHRONIC PAIN SYNDROME  . Palpitations   . Seizures ([place])    last seizure end of November 2018  . Spinal stenosis   . Tremor    ESSENTIAL  . Wheezing     Past Surgical History:  Procedure Laterality Date  . ABDOMINAL HYSTERECTOMY    . AV FISTULA PLACEMENT Right 10/16/2017   Procedure: ARTERIOVENOUS (AV) FISTULA CREATION (BRACHIOCEPHALIC);  Surgeon: Algernon Huxley, MD;  Location: [facility] ORS;  Service: Vascular;  Laterality: Right;  . CATARACT EXTRACTION W/PHACO Left 11/07/2016   Procedure: CATARACT EXTRACTION PHACO AND INTRAOCULAR LENS PLACEMENT (IOC);  Surgeon: Eulogio Bear, MD;  Location: [facility] ORS;  Service: Ophthalmology;  Laterality: Left;  Lot # [id number] H [place]: 00:38.0 AP%: 10.1 CDE: 3.86  . CATARACT EXTRACTION W/PHACO Right 01/09/2017   Procedure: CATARACT EXTRACTION PHACO AND INTRAOCULAR LENS PLACEMENT (IOC);  Surgeon: Eulogio Bear, MD;  Location: [facility] ORS;  Service: Ophthalmology;  Laterality: Right;  [place] 54.8 AP% 5.4 CDE 2.96 Fluid pack lot # [id number]  . CHEST SURGERY     BENIGN TUMOR  . CHOLECYSTECTOMY    . CORONARY ANGIOPLASTY     STENT  . DIALYSIS/PERMA CATHETER INSERTION N/A 11/11/2017   Procedure: DIALYSIS/PERMA CATHETER INSERTION;  Surgeon: Katha Cabal, MD;  Location: [facility];  Service: Cardiovascular;  Laterality: N/A;  . DIALYSIS/PERMA CATHETER INSERTION N/A 11/17/2017   Procedure: DIALYSIS/PERMA CATHETER INSERTION;  Surgeon: Algernon Huxley, MD;  Location: [facility] [facility];  Service: Cardiovascular;  Laterality: N/A;  . EYE SURGERY    . JOINT REPLACEMENT Bilateral    TKR  . SHOULDER SURGERY Left    shoulder replacement  . ULTRASOUND GUIDANCE FOR  VASCULAR ACCESS  10/16/2017   Procedure: ULTRASOUND GUIDANCE FOR MICROPUNCTURE VENOUS ACCESS;  Surgeon: Algernon Huxley, MD;  Location: [facility] ORS;  Service: Vascular;;    Social History Social History   Tobacco Use  . Smoking status: Never Smoker  . Smokeless tobacco: Never Used  Substance Use Topics  . Alcohol use: No    Alcohol/week: 0.0 oz  . Drug use: No  Family History Family History  Problem Relation Age of Onset  . CAD Mother   . Hypertension Mother   . Stroke Mother   . Kidney failure Mother   . Cancer Father   . Kidney failure Brother   . Kidney failure Son        on hemodialysis in [place]     Allergies  Allergen Reactions  . Pregabalin Other (See Comments)    Sleepy  . Azithromycin Other (See Comments)    Unknown  . Cymbalta [Duloxetine Hcl] Other (See Comments)    Per MAR  . Erythromycin Hives      REVIEW OF SYSTEMS (Negative unless checked)  Constitutional: [] Weight loss  [] Fever  [] Chills Cardiac: [] Chest pain   [] Chest pressure   [] Palpitations   [] Shortness of breath when laying flat   [] Shortness of breath at rest   [x] Shortness of breath with exertion. Vascular:  [] Pain in legs with walking   [] Pain in legs at rest   [] Pain in legs when laying flat   [] Claudication   [] Pain in feet when walking  [] Pain in feet at rest  [] Pain in feet when laying flat   [] History of DVT   [] Phlebitis   [x] Swelling in legs   [] Varicose veins   [] Non-healing ulcers Pulmonary:   [] Uses home oxygen   [] Productive cough   [] Hemoptysis   [] Wheeze  [] COPD   [] Asthma Neurologic:  [] Dizziness  [] Blackouts   [x] Seizures   [] History of stroke   [] History of TIA  [] Aphasia   [] Temporary blindness   [] Dysphagia   [] Weakness or numbness in arms   [] Weakness or numbness in legs Musculoskeletal:  [x] Arthritis   [] Joint swelling   [] Joint pain   [x] Low back pain Hematologic:  [] Easy bruising  [] Easy bleeding   [] Hypercoagulable state   [] Anemic  [] Hepatitis Gastrointestinal:   [] Blood in stool   [] Vomiting blood  [x] Gastroesophageal reflux/heartburn   [] Abdominal pain Genitourinary:  [x] Chronic kidney disease   [] Difficult urination  [] Frequent urination  [] Burning with urination   [] Hematuria Skin:  [] Rashes   [] Ulcers   [] Wounds Psychological:  [] History of anxiety   []  History of major depression.     Physical Examination  BP (!) 151/81 (BP Location: Left Arm, Patient Position: Sitting)   Pulse 82   Resp 18   Ht 5\' 4"  (1.626 m)   Wt 278 lb (126.1 kg)   BMI 47.72 kg/m  Gen:  WD/WN, NAD. Morbidly obese Head: [place]/AT, No temporalis wasting. Ear/Nose/Throat: Hearing grossly intact, nares w/o erythema or drainage Eyes: Conjunctiva clear. Sclera non-icteric Neck: Supple.  Trachea midline Pulmonary:  Good air movement, no use of accessory muscles.  Cardiac: RRR, no JVD Vascular: good thrill in right arm brachiobasilic AVF Vessel Right Left  Radial Palpable Palpable                                    Musculoskeletal: M/S 5/5 throughout.  No deformity or atrophy. Walks with a w edema. Neurologic: Sensation grossly intact in extremities.  Symmetrical.  Speech is fluent.  Psychiatric: Judgment intact, Mood & affect appropriate for pt's clinical situation. Dermatologic: No rashes or ulcers noted.  No cellulitis or open wounds.       Labs No results found for this or any previous visit (from the past 2160 hour(s)).  Radiology No results found.  Assessment/Plan  HTN (hypertension) blood pressure control important in reducing the progression  of atherosclerotic disease. On appropriate oral medications.   HLD (hyperlipidemia) lipid control important in reducing the progression of atherosclerotic disease. Continue statin therapy   ESRD on dialysis [facility]) The patient has a right brachiobasilic AV fistula which is not currently usable.  She needs a second stage transposition to bring this fistula closer to the skin to allow access.  I discussed  the risks and benefits the procedure.  I have recommended this be performed at her convenience in the near future.  She is agreeable to proceed and a second stage basilic transposition will be scheduled.    Leotis Pain, MD  03/03/2018 12:18 PM    This note was created with Dragon medical transcription system.  Any errors from dictation are purely unintentional

## 2018-03-03 NOTE — Assessment & Plan Note (Signed)
blood pressure control important in reducing the progression of atherosclerotic disease. On appropriate oral medications.  

## 2018-03-03 NOTE — Patient Instructions (Signed)
Vascular Access for Hemodialysis A vascular access is a connection between two blood vessels that allows blood to be easily removed from the body and returned to the body during hemodialysis. Hemodialysis is a procedure in which a machine outside of the body filters the blood. There are three types of vascular accesses:  Arteriovenous fistula. This is a connection between an artery and a vein (usually in the arm) that is made by sewing them together. Blood in the artery flows directly into the vein, causing it to get larger over time. This makes it easier for the vein to be used for hemodialysis. An arteriovenous fistula takes 1-6 months to develop after surgery.  Arteriovenous graft. This is a connection between an artery and a vein in the arm that is made with a tube. An arteriovenous graft can be used within 2-3 weeks of surgery.  Venous catheter. This is a thin, flexible tube that is placed in a large vein (usually in the neck, chest, or groin). A venous catheter for hemodialysis contains two tubes that come out of the skin. A venous catheter can be used right away. It is usually used as a temporary access if you need hemodialysis before a fistula or graft has developed. It may also be used as a permanent access if a fistula or graft cannot be created.  Which type of access is best for me? The type of access that is best for you depends on the size and strength of your veins. A fistula is usually the preferred type of access. It can last several years and is less likely than the other types of accesses to become infected or to cause blood clots within a blood vessel (thrombosis). However, a fistula is not an option for everyone. If your veins are not the right size, a graft may be used instead. Grafts require you to have strong veins. If your veins are not strong enough for a graft, a catheter may be used. Catheters are more likely than fistulas and grafts to become infected or to have  thrombosis. Sometimes, only one type of access is an option. Your health care provider will help you determine which type of access is best for you. How is a vascular access used? The way the access is used depends on the type of access:  If the access is a fistula or graft, two needles are inserted through the skin into the access before each hemodialysis session. Blood leaves the body through one of the needles and travels through a tube to the hemodialysis machine (dialyzer). It then flows through another tube and returns to the body through the second needle.  If the access is a catheter, one tube is connected directly to the tube that leads to the dialyzer and the other is connected to a tube that leads away from the dialyzer. Blood leaves the body through one tube and returns to the body through the other.  What kind of problems can occur with vascular accesses?  Blood clots within a blood vessel (thrombosis). Thrombosis can lead to a narrowing of a blood vessel or tube (stenosis). If thrombosis occurs frequently, another access site may be created as a backup.  Infection. These problems are most likely to occur with a venous catheter and least likely to occur with an arteriovenous fistula. How do I care for my vascular access? Wear a medical alert bracelet. This tells health care providers that you are a dialysis patient in the case of an emergency and   allows them to care for your veins appropriately. If you have a graft or fistula:  A "bruit" is a noise that is heard with a stethoscope and a "thrill" is a vibration felt over the graft or fistula. The presence of the bruit and thrill indicates that the access is working. You will be taught to feel for the thrill each day. If this is not felt, the access may be clotted. Call your health care provider.  You may use the arm where your vascular access is located freely after the site heals. Keep the following in mind: ? Avoid pressure on the  arm. ? Avoid lifting heavy objects with the arm. ? Avoid sleeping on the arm. ? Avoid wearing tight-sleeved shirts or jewelry around the graft or fistula.  Do not allow blood pressure monitoring or needle punctures on the side where the graft or fistula is located.  With permission from your health care provider, you may do exercises to help with blood flow through a fistula. These exercises involve squeezing a rubber ball or other soft objects as instructed.  Contact a health care provider if:  Chills develop.  You have an oral temperature above 102 F (38.9 C).  Swelling around the graft or fistula gets worse.  New pain develops.  Pus or other fluid (drainage) is seen at the vascular access site.  Skin redness or red streaking is seen on the skin around, above, or below the vascular access. Get help right away if:  Pain, numbness, or an unusual pale skin color develops in the hand on the side of your fistula.  Dizziness or weakness develops that you have not had before.  The vascular access has bleeding that cannot be easily controlled. This information is not intended to replace advice given to you by your health care provider. Make sure you discuss any questions you have with your health care provider. Document Released: 01/04/2003 Document Revised: 03/21/2016 Document Reviewed: 03/02/2013 Elsevier Interactive Patient Education  2017 [employer]vier Inc.  

## 2018-03-03 NOTE — Assessment & Plan Note (Signed)
The patient has a right brachiobasilic AV fistula which is not currently usable.  She needs a second stage transposition to bring this fistula closer to the skin to allow access.  I discussed the risks and benefits the procedure.  I have recommended this be performed at her convenience in the near future.  She is agreeable to proceed and a second stage basilic transposition will be scheduled.

## 2018-03-05 ENCOUNTER — Encounter
Admission: RE | Admit: 2018-03-05 | Discharge: 2018-03-05 | Disposition: A | Discharge status: Home / Self Care | Payer: Medicare (Managed Care) | Source: Ambulatory Visit | Attending: Vascular Surgery | Admitting: Vascular Surgery

## 2018-03-05 ENCOUNTER — Other Ambulatory Visit: Payer: Self-pay

## 2018-03-05 DIAGNOSIS — Z01812 Encounter for preprocedural laboratory examination: Secondary | ICD-10-CM | POA: Diagnosis not present

## 2018-03-05 DIAGNOSIS — Z01810 Encounter for preprocedural cardiovascular examination: Secondary | ICD-10-CM | POA: Insufficient documentation

## 2018-03-05 HISTORY — DX: Major depressive disorder, single episode, unspecified: F32.9

## 2018-03-05 HISTORY — DX: Depression, unspecified: F32.A

## 2018-03-05 HISTORY — DX: Restless legs syndrome: G25.81

## 2018-03-05 NOTE — Patient Instructions (Signed)
Your procedure is scheduled on:Mar 12, 2018 thursday Report to Day Surgery on the 2nd floor of the [facility]. To find out your arrival time, please call [phone] between 1PM - 3PM on: Mar 11, 2018 [facility]  REMEMBER: Instructions that are not followed completely may result in serious medical risk, up to and including death; or upon the discretion of your surgeon and anesthesiologist your surgery may need to be rescheduled.  Do not eat food after midnight the night before your procedure.  No gum chewing, lozengers or hard candies.  You may however, drink CLEAR liquids up to 2 hours before you are scheduled to arrive for your surgery. Do not drink anything within 2 hours of the start of your surgery.  Clear liquids include: - water  - apple juice without pulp - clear gatorade - black coffee or tea (Do NOT add anything to the coffee or tea) Do NOT drink anything that is not on this list.  Type 1 and Type 2 diabetics should only drink water.  No Alcohol for 24 hours before or after surgery.  No Smoking including e-cigarettes for 24 hours prior to surgery.  No chewable tobacco products for at least 6 hours prior to surgery.  No nicotine patches on the day of surgery.  On the morning of surgery brush your teeth with toothpaste and water, you may rinse your mouth with mouthwash if you wish. Do not swallow any toothpaste or mouthwash.  Notify your doctor if there is any change in your medical condition (cold, fever, infection).  Do not wear jewelry, make-up, hairpins, clips or nail polish.  Do not wear lotions, powders, or perfumes. You may NOT wear deodorant.  Do not shave 48 hours prior to surgery. Men may shave face and neck.  Contacts and dentures may not be worn into surgery.  Do not bring valuables to the hospital, including drivers license, insurance or credit cards.  [facility] is not responsible for any belongings or valuables.   TAKE THESE MEDICATIONS THE  MORNING OF SURGERY: ISOSORBIDE CARVEDILOL DEPAKENE ULORIC PANTOPRAZOLE  Use SAGE WIPES  as directed on instruction sheet.  Follow recommendations from Cardiologist, Pulmonologist or PCP regarding stopping Aspirin, Coumadin, Plavix, Eliquis, Pradaxa, or Pletal.  Stop Anti-inflammatories (NSAIDS) such as Advil, Aleve, Ibuprofen, Motrin, Naproxen, Naprosyn and Aspirin based products such as Excedrin, Goodys Powder, BC Powder. (May take Tylenol or Acetaminophen if needed.)  Stop ANY OVER THE COUNTER supplements until after surgery. (May continue Vitamin D, Vitamin B, and multivitamin.)  Wear comfortable clothing (specific to your surgery type) to the hospital.  Plan for stool softeners for home use.  If you are being discharged the day of surgery, you will not be allowed to drive home. You will need a responsible adult to drive you home and stay with you that night.   If you are taking public transportation, you will need to have a responsible adult with you. Please confirm with your physician that it is acceptable to use public transportation.   Please call [phone] if you have any questions about these instructions.

## 2018-03-05 NOTE — Pre-Procedure Instructions (Signed)
AS INSTRUCTED BY DR Gildardo Griffes, EKG/CARDIAC CLEARANCE REQUEST CALLED AND FAXED TO DR Ubaldo Glassing. ALSO FAXED TO DR Lucky [email or web address].SPOKE WITH PATRICIA AT DR FATH'S.

## 2018-03-06 ENCOUNTER — Telehealth (INDEPENDENT_AMBULATORY_CARE_PROVIDER_SITE_OTHER): Payer: Self-pay

## 2018-03-06 NOTE — Telephone Encounter (Signed)
Sarah Hamilton from [facility] called stating the patient told them she was unable to give blood at her pre-op. She wanted the hospital to know that they will be drawing her full monthly labs on Monday if it was needed. The patient is needing cardiac clearance which was faxed to Dr. Bethanne Ginger office from pre-admission.

## 2018-03-09 NOTE — Pre-Procedure Instructions (Addendum)
CARDIOLOGY FAXED BACK REQUEST FOR CLEARANCE DID NOT REACH PATIENT. LM ON EMERGENCY CONTACT NUMBER. LM  ON NURSES LINE DR Lucky Cowboy PATIENT AT [facility]. SPOKE WITH AND FAXED TO [facility]/REQUEST TO HAVE DR BETH ROSENGERG,CARDIOLOGIST CLEAR FOR SURGERY. LM ON NURSES;S LINE AT DR DEW'S

## 2018-03-11 MED ORDER — LACTATED RINGERS IV SOLN: INTRAVENOUS | Status: DC

## 2018-03-11 NOTE — Pre-Procedure Instructions (Addendum)
CLEARANCE BY DR ROSENBERG OMN CHART RECEIVED CBC ,MET B FROM [facility].M ORDERED TS,PT/APTT, I SAT FOR AM OF SURGERY

## 2018-03-11 NOTE — Anesthesia Pain Management Evaluation Note (Addendum)
LIMIT FLUIDS IN CLEARANCE NOTE FROM DR ROSENBERG CARDIOLOGIST.

## 2018-03-12 ENCOUNTER — Ambulatory Visit: Payer: Medicare (Managed Care) | Admitting: Anesthesiology

## 2018-03-12 ENCOUNTER — Encounter
Admission: RE | Disposition: A | Discharge status: Home / Self Care | Payer: Self-pay | Source: Ambulatory Visit | Attending: Vascular Surgery

## 2018-03-12 ENCOUNTER — Other Ambulatory Visit: Payer: Self-pay

## 2018-03-12 ENCOUNTER — Encounter: Payer: Self-pay | Admitting: *Deleted

## 2018-03-12 ENCOUNTER — Ambulatory Visit
Admission: RE | Admit: 2018-03-12 | Discharge: 2018-03-12 | Disposition: A | Discharge status: Home / Self Care | Payer: Medicare (Managed Care) | Source: Ambulatory Visit | Attending: Vascular Surgery | Admitting: Vascular Surgery

## 2018-03-12 DIAGNOSIS — I509 Heart failure, unspecified: Secondary | ICD-10-CM | POA: Insufficient documentation

## 2018-03-12 DIAGNOSIS — N186 End stage renal disease: Secondary | ICD-10-CM | POA: Diagnosis not present

## 2018-03-12 DIAGNOSIS — Z87891 Personal history of nicotine dependence: Secondary | ICD-10-CM | POA: Diagnosis not present

## 2018-03-12 DIAGNOSIS — Z79899 Other long term (current) drug therapy: Secondary | ICD-10-CM | POA: Insufficient documentation

## 2018-03-12 DIAGNOSIS — N2581 Secondary hyperparathyroidism of renal origin: Secondary | ICD-10-CM | POA: Diagnosis not present

## 2018-03-12 DIAGNOSIS — E039 Hypothyroidism, unspecified: Secondary | ICD-10-CM | POA: Diagnosis not present

## 2018-03-12 DIAGNOSIS — I132 Hypertensive heart and chronic kidney disease with heart failure and with stage 5 chronic kidney disease, or end stage renal disease: Secondary | ICD-10-CM | POA: Insufficient documentation

## 2018-03-12 DIAGNOSIS — M109 Gout, unspecified: Secondary | ICD-10-CM | POA: Insufficient documentation

## 2018-03-12 DIAGNOSIS — E785 Hyperlipidemia, unspecified: Secondary | ICD-10-CM | POA: Insufficient documentation

## 2018-03-12 DIAGNOSIS — I2510 Atherosclerotic heart disease of native coronary artery without angina pectoris: Secondary | ICD-10-CM | POA: Diagnosis not present

## 2018-03-12 DIAGNOSIS — Z6841 Body Mass Index (BMI) 40.0 and over, adult: Secondary | ICD-10-CM | POA: Insufficient documentation

## 2018-03-12 DIAGNOSIS — K219 Gastro-esophageal reflux disease without esophagitis: Secondary | ICD-10-CM | POA: Diagnosis not present

## 2018-03-12 DIAGNOSIS — I120 Hypertensive chronic kidney disease with stage 5 chronic kidney disease or end stage renal disease: Secondary | ICD-10-CM | POA: Diagnosis present

## 2018-03-12 HISTORY — PX: BASCILIC VEIN TRANSPOSITION: SHX5742

## 2018-03-12 LAB — POCT I-STAT 4, (NA,K, GLUC, HGB,HCT)
Glucose, Bld: 76 mg/dL (ref 65–99)
HCT: 36 % (ref 36.0–46.0)
Hemoglobin: 12.2 g/dL (ref 12.0–15.0)
POTASSIUM: 3.9 mmol/L (ref 3.5–5.1)
SODIUM: 139 mmol/L (ref 135–145)

## 2018-03-12 SURGERY — TRANSPOSITION, VEIN, BASILIC
Anesthesia: General | Laterality: Right

## 2018-03-12 MED ORDER — SODIUM CHLORIDE 0.9 % IV SOLN
INTRAVENOUS | Status: DC | PRN
  Administered 2018-03-12: 55 mL via INTRAMUSCULAR

## 2018-03-12 MED ORDER — FENTANYL CITRATE (PF) 100 MCG/2ML IJ SOLN
25.0000 ug | INTRAMUSCULAR | Status: DC | PRN
Start: 2018-03-12 — End: 2018-03-12
  Administered 2018-03-12 (×4): 25 ug via INTRAVENOUS

## 2018-03-12 MED ORDER — HEPARIN SODIUM (PORCINE) 1000 UNIT/ML IJ SOLN
INTRAMUSCULAR | Status: AC
  Filled 2018-03-12: qty 1

## 2018-03-12 MED ORDER — FENTANYL CITRATE (PF) 100 MCG/2ML IJ SOLN
INTRAMUSCULAR | Status: DC | PRN
  Administered 2018-03-12 (×3): 50 ug via INTRAVENOUS

## 2018-03-12 MED ORDER — OXYCODONE HCL 5 MG PO TABS: 5.0000 mg | ORAL_TABLET | Freq: Once | ORAL | Status: DC

## 2018-03-12 MED ORDER — SODIUM CHLORIDE 0.9 % IJ SOLN
INTRAMUSCULAR | Status: AC
  Filled 2018-03-12: qty 10

## 2018-03-12 MED ORDER — HEPARIN SODIUM (PORCINE) 1000 UNIT/ML IJ SOLN
INTRAMUSCULAR | Status: DC | PRN
  Administered 2018-03-12: 4000 [IU] via INTRAVENOUS

## 2018-03-12 MED ORDER — PENTAFLUOROPROP-TETRAFLUOROETH EX AERO
INHALATION_SPRAY | CUTANEOUS | Status: AC
  Filled 2018-03-12: qty 30

## 2018-03-12 MED ORDER — SUGAMMADEX SODIUM 200 MG/2ML IV SOLN
INTRAVENOUS | Status: AC
  Filled 2018-03-12: qty 2

## 2018-03-12 MED ORDER — ONDANSETRON HCL 4 MG/2ML IJ SOLN
4.0000 mg | Freq: Once | INTRAMUSCULAR | Status: AC | PRN
  Administered 2018-03-12: 4 mg via INTRAVENOUS

## 2018-03-12 MED ORDER — FENTANYL CITRATE (PF) 100 MCG/2ML IJ SOLN
INTRAMUSCULAR | Status: AC
  Administered 2018-03-12: 25 ug via INTRAVENOUS
  Filled 2018-03-12: qty 2

## 2018-03-12 MED ORDER — PROPOFOL 10 MG/ML IV BOLUS
INTRAVENOUS | Status: DC | PRN
  Administered 2018-03-12: 100 mg via INTRAVENOUS

## 2018-03-12 MED ORDER — DEXAMETHASONE SODIUM PHOSPHATE 10 MG/ML IJ SOLN
INTRAMUSCULAR | Status: AC
  Filled 2018-03-12: qty 1

## 2018-03-12 MED ORDER — ONDANSETRON HCL 4 MG/2ML IJ SOLN
INTRAMUSCULAR | Status: AC
  Filled 2018-03-12: qty 2

## 2018-03-12 MED ORDER — SUGAMMADEX SODIUM 200 MG/2ML IV SOLN
INTRAVENOUS | Status: DC | PRN
  Administered 2018-03-12: 260 mg via INTRAVENOUS

## 2018-03-12 MED ORDER — BUPIVACAINE HCL (PF) 0.5 % IJ SOLN
INTRAMUSCULAR | Status: AC
Start: 2018-03-12 — End: ?
  Filled 2018-03-12: qty 30

## 2018-03-12 MED ORDER — CHLORHEXIDINE GLUCONATE CLOTH 2 % EX PADS: 6.0000 | MEDICATED_PAD | Freq: Once | CUTANEOUS | Status: DC

## 2018-03-12 MED ORDER — CEFAZOLIN SODIUM-DEXTROSE 2-4 GM/100ML-% IV SOLN
INTRAVENOUS | Status: AC
  Filled 2018-03-12: qty 100

## 2018-03-12 MED ORDER — LIDOCAINE HCL (CARDIAC) PF 100 MG/5ML IV SOSY
PREFILLED_SYRINGE | INTRAVENOUS | Status: DC | PRN
  Administered 2018-03-12: 50 mg via INTRAVENOUS

## 2018-03-12 MED ORDER — ROCURONIUM BROMIDE 100 MG/10ML IV SOLN
INTRAVENOUS | Status: DC | PRN
  Administered 2018-03-12: 40 mg via INTRAVENOUS

## 2018-03-12 MED ORDER — ONDANSETRON HCL 4 MG/2ML IJ SOLN
INTRAMUSCULAR | Status: DC | PRN
  Administered 2018-03-12: 4 mg via INTRAVENOUS

## 2018-03-12 MED ORDER — EVICEL 2 ML EX KIT
PACK | CUTANEOUS | Status: DC | PRN
  Administered 2018-03-12: 2 mL

## 2018-03-12 MED ORDER — OXYCODONE-ACETAMINOPHEN 5-325 MG PO TABS: 1.0000 | ORAL_TABLET | Freq: Four times a day (QID) | ORAL | 0 refills | Status: DC | PRN

## 2018-03-12 MED ORDER — EVICEL 2 ML EX KIT
PACK | CUTANEOUS | Status: AC
  Filled 2018-03-12: qty 1

## 2018-03-12 MED ORDER — EPHEDRINE SULFATE 50 MG/ML IJ SOLN: INTRAMUSCULAR | Status: DC | PRN

## 2018-03-12 MED ORDER — SODIUM CHLORIDE 0.9 % IV SOLN
INTRAVENOUS | Status: DC
Start: 2018-03-12 — End: 2018-03-12
  Administered 2018-03-12 (×2): via INTRAVENOUS

## 2018-03-12 MED ORDER — HEPARIN SODIUM (PORCINE) 5000 UNIT/ML IJ SOLN
INTRAMUSCULAR | Status: AC
  Filled 2018-03-12: qty 1

## 2018-03-12 MED ORDER — HEPARIN SODIUM (PORCINE) 1000 UNIT/ML DIALYSIS
2000.0000 [IU] | Freq: Once | INTRAMUSCULAR | Status: AC
  Administered 2018-03-12: 1500 [IU]
  Filled 2018-03-12 (×4): qty 2

## 2018-03-12 MED ORDER — DEXAMETHASONE SODIUM PHOSPHATE 10 MG/ML IJ SOLN
INTRAMUSCULAR | Status: DC | PRN
  Administered 2018-03-12: 5 mg via INTRAVENOUS

## 2018-03-12 MED ORDER — FENTANYL CITRATE (PF) 250 MCG/5ML IJ SOLN
INTRAMUSCULAR | Status: AC
  Filled 2018-03-12: qty 5

## 2018-03-12 MED ORDER — EPHEDRINE SULFATE 50 MG/ML IJ SOLN
INTRAMUSCULAR | Status: DC | PRN
  Administered 2018-03-12 (×2): 5 mg via INTRAVENOUS

## 2018-03-12 MED ORDER — PHENYLEPHRINE HCL 10 MG/ML IJ SOLN
INTRAMUSCULAR | Status: DC | PRN
  Administered 2018-03-12 (×3): 50 ug via INTRAVENOUS
  Administered 2018-03-12 (×2): 150 ug via INTRAVENOUS

## 2018-03-12 MED ORDER — GLYCOPYRROLATE 0.2 MG/ML IJ SOLN
INTRAMUSCULAR | Status: DC | PRN
  Administered 2018-03-12: 0.1 mg via INTRAVENOUS

## 2018-03-12 MED ORDER — CEFAZOLIN SODIUM-DEXTROSE 2-4 GM/100ML-% IV SOLN
2.0000 g | INTRAVENOUS | Status: AC
  Administered 2018-03-12: 2 g via INTRAVENOUS

## 2018-03-12 SURGICAL SUPPLY — 58 items
APPLIER CLIP 11 MED OPEN (CLIP)
APPLIER CLIP 9.375 SM OPEN (CLIP)
BAG DECANTER FOR FLEXI CONT (MISCELLANEOUS) ×3 IMPLANT
BLADE SURG 15 STRL LF DISP TIS (BLADE) ×1 IMPLANT
BLADE SURG 15 STRL SS (BLADE) ×2
BLADE SURG SZ11 CARB STEEL (BLADE) ×3 IMPLANT
BOOT SUTURE AID YELLOW STND (SUTURE) ×3 IMPLANT
BRUSH SCRUB EZ  4% CHG (MISCELLANEOUS) ×2
BRUSH SCRUB EZ 4% CHG (MISCELLANEOUS) ×1 IMPLANT
CANISTER SUCT 1200ML W/VALVE (MISCELLANEOUS) ×3 IMPLANT
CHLORAPREP W/TINT 26ML (MISCELLANEOUS) ×3 IMPLANT
CLIP APPLIE 11 MED OPEN (CLIP) IMPLANT
CLIP APPLIE 9.375 SM OPEN (CLIP) IMPLANT
CLIP SPRNG 6MM S-JAW DBL (CLIP) ×3
DECANTER SPIKE VIAL GLASS SM (MISCELLANEOUS) ×3 IMPLANT
DERMABOND ADVANCED (GAUZE/BANDAGES/DRESSINGS) ×2
DERMABOND ADVANCED .7 DNX12 (GAUZE/BANDAGES/DRESSINGS) ×1 IMPLANT
DRESSING SURGICEL FIBRLLR 1X2 (HEMOSTASIS) ×1 IMPLANT
DRSG SURGICEL FIBRILLAR 1X2 (HEMOSTASIS) ×3
ELECT CAUTERY BLADE 6.4 (BLADE) ×3 IMPLANT
ELECT REM PT RETURN 9FT ADLT (ELECTROSURGICAL) ×3
ELECTRODE REM PT RTRN 9FT ADLT (ELECTROSURGICAL) ×1 IMPLANT
GEL ULTRASOUND 20GR AQUASONIC (MISCELLANEOUS) ×3 IMPLANT
GLOVE BIO SURGEON STRL SZ7 (GLOVE) ×3 IMPLANT
GLOVE INDICATOR 7.5 STRL GRN (GLOVE) ×3 IMPLANT
GLOVE SURG SYN 8.0 (GLOVE) ×3 IMPLANT
GOWN L4 XLG 20 PK N/S (GOWN DISPOSABLE) ×6 IMPLANT
GOWN STRL REUS W/ TWL LRG LVL3 (GOWN DISPOSABLE) ×1 IMPLANT
GOWN STRL REUS W/TWL LRG LVL3 (GOWN DISPOSABLE) ×2
IV NS 500ML (IV SOLUTION) ×2
IV NS 500ML BAXH (IV SOLUTION) ×1 IMPLANT
KIT TURNOVER KIT A (KITS) ×3 IMPLANT
LABEL OR SOLS (LABEL) ×3 IMPLANT
LOOP RED MAXI  1X406MM (MISCELLANEOUS) ×2
LOOP VESSEL MAXI 1X406 RED (MISCELLANEOUS) ×1 IMPLANT
LOOP VESSEL MINI 0.8X406 BLUE (MISCELLANEOUS) ×2 IMPLANT
LOOPS BLUE MINI 0.8X406MM (MISCELLANEOUS) ×4
NEEDLE FILTER BLUNT 18X 1/2SAF (NEEDLE) ×2
NEEDLE FILTER BLUNT 18X1 1/2 (NEEDLE) ×1 IMPLANT
PACK EXTREMITY ARMC (MISCELLANEOUS) ×3 IMPLANT
PAD PREP 24X41 OB/GYN DISP (PERSONAL CARE ITEMS) ×3 IMPLANT
SOLUTION CELL SAVER (CLIP) ×1 IMPLANT
STOCKINETTE ORTHO 4X25 (MISCELLANEOUS) ×3 IMPLANT
STOCKINETTE STRL 4IN 9604848 (GAUZE/BANDAGES/DRESSINGS) ×3 IMPLANT
SUT MNCRL+ 5-0 UNDYED PC-3 (SUTURE) ×2 IMPLANT
SUT MONOCRYL 5-0 (SUTURE) ×4
SUT PROLENE 6 0 BV (SUTURE) ×12 IMPLANT
SUT SILK 2 0 (SUTURE) ×2
SUT SILK 2 0 SH (SUTURE) ×3 IMPLANT
SUT SILK 2-0 18XBRD TIE 12 (SUTURE) ×1 IMPLANT
SUT SILK 3 0 (SUTURE) ×2
SUT SILK 3-0 18XBRD TIE 12 (SUTURE) ×1 IMPLANT
SUT SILK 4 0 (SUTURE) ×2
SUT SILK 4-0 18XBRD TIE 12 (SUTURE) ×1 IMPLANT
SUT VIC AB 3-0 SH 27 (SUTURE) ×6
SUT VIC AB 3-0 SH 27X BRD (SUTURE) ×3 IMPLANT
SYR 20CC LL (SYRINGE) ×3 IMPLANT
SYR 3ML LL SCALE MARK (SYRINGE) ×3 IMPLANT

## 2018-03-12 NOTE — Anesthesia Postprocedure Evaluation (Signed)
Anesthesia Post Note  Patient: Sarah Hamilton  Procedure(s) Performed: [place] (Right )  Patient location during evaluation: PACU Anesthesia Type: General Level of consciousness: awake and alert and oriented Pain management: pain level controlled Vital Signs Assessment: post-procedure vital signs reviewed and stable Respiratory status: spontaneous breathing, nonlabored ventilation and respiratory function stable Cardiovascular status: blood pressure returned to baseline and stable Postop Assessment: no signs of nausea or vomiting Anesthetic complications: no     Last Vitals:  Vitals:   03/12/18 1747 03/12/18 1748  BP: 135/73   Pulse: 97 97  Resp: 19 16  Temp:    SpO2: 95% 97%    Last Pain:  Vitals:   03/12/18 1759  TempSrc:   PainSc: 7                  Detravion Tester

## 2018-03-12 NOTE — Anesthesia Procedure Notes (Signed)
Procedure Name: Intubation Date/Time: 03/12/2018 2:38 PM Performed by: Nile Riggs, CRNA Pre-anesthesia Checklist: Patient identified, Emergency Drugs available, Suction available, Patient being monitored and Timeout performed Patient Re-evaluated:Patient Re-evaluated prior to induction Oxygen Delivery Method: Circle system utilized Preoxygenation: Pre-oxygenation with 100% oxygen Induction Type: IV induction Ventilation: Mask ventilation without difficulty Laryngoscope Size: Miller and 2 Grade View: Grade I Tube type: Oral Tube size: 7.0 mm Number of attempts: 1 Airway Equipment and Method: Stylet and Patient positioned with wedge pillow Placement Confirmation: ETT inserted through vocal cords under direct vision,  positive ETCO2,  CO2 detector and breath sounds checked- equal and bilateral Secured at: 22 cm Tube secured with: Tape Dental Injury: Teeth and Oropharynx as per pre-operative assessment

## 2018-03-12 NOTE — Op Note (Signed)
OPERATIVE NOTE   PROCEDURE: Right arm second stage basilic vein transposition (brachiobasilic arteriovenous fistula) placement  PRE-OPERATIVE DIAGNOSIS: ESRD, non-useable right arm brachiobasilic AVF  POST-OPERATIVE DIAGNOSIS: same as above  SURGEON: Leotis Pain, MD  ASSISTANT(S): Hezzie Bump, PA-C  ANESTHESIA: general  ESTIMATED BLOOD LOSS: 25 cc  FINDING(S): None  SPECIMEN(S):  None  INDICATIONS:   Patient is a 72 y.o. female who presents with end-stage renal disease and is status post an initial brachiobasilic AV fistula. The vein is deep and is not in a position that it can be used for dialysis.  The patient is scheduled for right arm second stage basilic vein transposition.  The patient is aware the risks include but are not limited to: bleeding, infection, steal syndrome, nerve damage, ischemic monomelic neuropathy, failure to mature, and need for additional procedures.  The patient is aware of the risks of the procedure and elects to proceed forward.  DESCRIPTION: After full informed written consent was obtained from the patient, the patient was brought back to the operating room and placed supine upon the operating table.  Prior to induction, the patient received IV antibiotics.   After obtaining adequate anesthesia, the patient was then prepped and draped in the standard fashion for a right arm access procedure.  I turned my attention first to identifying the patient's brachiobasilic arteriovenous fistula.  I made an longitudinal incision over the fistula from its arterial anastomosis up to its axillary extent.  I carefully dissected the fistula away from its adjacent nerves.  Eventually the entirety of this fistula was mobilized and I dissected a plane on top of the bicipital fascia with electrocautery. Several venous branches were ligated and divided between silk ties to facilitate the exposure and transposition of the fistula. I then used a large Vanderbilt clamp to  tunnel superficially from the distal portion of the incision to the proximal portion of the incision on the top of the arm. The patient was given 4000 units of intravenous heparin. I then clamped the fistula just beyond the anastomosis in the basilic vein with a profunda clamp and transected the vein about 2 cm beyond this. The vein was then transposed through the tunnel created with the large Vanderbilt clamp, taking care not to twist the vein after marking it for orientation. The vessel was flushed with heparinized saline. An end to end anastomosis was then created with the newly transposed basilic vein to the initial portion of the fistula and the basilic vein with 6-0 Prolene sutures creating an anastomosis in an end to end fashion. The vessel was flushed and de-aired prior to release of control. On release, a palpable thrill could not be felt within the basilic vein. I interrogated the access and although a kink was not obvious, I knew the access would not be adequate.  I then took down the anastomosis and reclamped the inflow portion.  I then created a tunnel and brought the vein through the tunnel and down to the antecubital area.  The anastomosis was then created with two 6-0 prolene sutures in an end to end fashion.  The vessel was flushed and deaired prior to release of control. This time, there was a palpable thrill in the vein up to the axilla that could be palpated. The deep subcutaneous tissue was inspected for bleeding. I washed out the surgical site after waiting a few minutes, and there was no further bleeding.  Bleeding was controlled with electrocautery and placement of pieces of Surgicel and Evicel.  The fascia was reapproximated with interrupted stitches of 2-0 Vicryl to eliminate some of the deep space.  The superficial subcutaneous tissue was then reapproximated along the incision line with a running stitch of 3-0 Vicryl.  The skin was then reapproximated with a running subcuticular of 4-0  Monocryl.  The skin was then cleaned, dried, and reinforced with Dermabond.  The patient tolerated this procedure well and was taken to the recovery room in stable condition.  COMPLICATIONS: None  CONDITION: Stable    Leotis Pain  03/12/2018, 4:51 PM      This note was created with Dragon Medical transcription system. Any errors in dictation are purely unintentional.

## 2018-03-12 NOTE — Anesthesia Preprocedure Evaluation (Signed)
Anesthesia Evaluation  Patient identified by MRN, date of birth, ID band Patient awake    Reviewed: Allergy & Precautions, NPO status , Patient's Chart, lab work & pertinent test results, reviewed documented beta blocker date and time   Airway Mallampati: III  TM Distance: >3 FB     Dental  (+) Chipped   Pulmonary shortness of breath, former smoker,           Cardiovascular hypertension, Pt. on medications and Pt. on home beta blockers + angina + CAD, +CHF and + Orthopnea  + Valvular Problems/Murmurs      Neuro/Psych Seizures -,  PSYCHIATRIC DISORDERS    GI/Hepatic GERD  Controlled,(+) Hepatitis -  Endo/Other  Hypothyroidism Morbid obesity  Renal/GU Renal disease     Musculoskeletal  (+) Arthritis ,   Abdominal   Peds  Hematology  (+) anemia ,   Anesthesia Other Findings Gout.Last seizure. Hb 8. On depakote.  Reproductive/Obstetrics                             Anesthesia Physical  Anesthesia Plan  ASA: IV  Anesthesia Plan: General   Post-op Pain Management:    Induction: Intravenous  PONV Risk Score and Plan:   Airway Management Planned: Oral ETT  Additional Equipment:   Intra-op Plan:   Post-operative Plan: Extubation in OR  Informed Consent: I have reviewed the patients History and Physical, chart, labs and discussed the procedure including the risks, benefits and alternatives for the proposed anesthesia with the patient or authorized representative who has indicated his/her understanding and acceptance.     Plan Discussed with: CRNA  Anesthesia Plan Comments:         Anesthesia Quick Evaluation

## 2018-03-12 NOTE — Discharge Instructions (Signed)

## 2018-03-12 NOTE — H&P (Signed)
[facility] History & Physical Update  The patient was interviewed and re-examined.  The patient's previous History and Physical has been reviewed and is unchanged.  There is no change in the plan of care. We plan to proceed with the scheduled procedure.  Leotis Pain, MD  03/12/2018, 1:32 PM

## 2018-03-12 NOTE — Transfer of Care (Signed)
Immediate Anesthesia Transfer of Care Note  Patient: Sarah Hamilton  Procedure(s) Performed: [place] (Right )  Patient Location: PACU  Anesthesia Type:General  Level of Consciousness: sedated  Airway & Oxygen Therapy: Patient Spontanous Breathing and Patient connected to face mask oxygen  Post-op Assessment: Report given to RN and Post -op Vital signs reviewed and stable  Post vital signs: Reviewed and stable  Last Vitals:  Vitals Value Taken Time  BP 143/73 03/12/2018  5:35 PM  Temp    Pulse 88 03/12/2018  5:34 PM  Resp    SpO2 97 % 03/12/2018  5:34 PM  Vitals shown include unvalidated device data.  Last Pain:  Vitals:   03/12/18 1313  TempSrc: Temporal  PainSc: 0-No pain         Complications: No apparent anesthesia complications

## 2018-03-12 NOTE — Anesthesia Post-op Follow-up Note (Signed)
Anesthesia QCDR form completed.        

## 2018-03-13 ENCOUNTER — Encounter: Payer: Self-pay | Admitting: Vascular Surgery

## 2018-03-18 ENCOUNTER — Telehealth (INDEPENDENT_AMBULATORY_CARE_PROVIDER_SITE_OTHER): Payer: Self-pay | Admitting: Vascular Surgery

## 2018-03-18 NOTE — Telephone Encounter (Signed)
The patient had a basilic transposition on 03/12/18. See note below.

## 2018-03-18 NOTE — Telephone Encounter (Signed)
Spoke with Sarah Hamilton the charge nurse and let her know that per Dr. Lucky Cowboy this is normal considering she is barely a week out from surgery. Sarah Hamilton also needed to schedule a follow up appt as well so she was transferred to the front desk.

## 2018-03-18 NOTE — Telephone Encounter (Signed)
Sarah Hamilton the vascular active nures doing her rounds called about this patient. She stated that the patients arm where she had surgery is warm, swollen, red and tender to the touch. [name] said she can be reached at [phone].

## 2018-04-10 ENCOUNTER — Telehealth (INDEPENDENT_AMBULATORY_CARE_PROVIDER_SITE_OTHER): Payer: Self-pay | Admitting: Vascular Surgery

## 2018-04-13 ENCOUNTER — Ambulatory Visit (INDEPENDENT_AMBULATORY_CARE_PROVIDER_SITE_OTHER): Payer: Medicare (Managed Care) | Admitting: Vascular Surgery

## 2018-04-13 ENCOUNTER — Encounter (INDEPENDENT_AMBULATORY_CARE_PROVIDER_SITE_OTHER): Payer: Medicare (Managed Care)

## 2018-04-23 ENCOUNTER — Ambulatory Visit (INDEPENDENT_AMBULATORY_CARE_PROVIDER_SITE_OTHER): Payer: Medicare (Managed Care) | Admitting: Vascular Surgery

## 2018-04-27 ENCOUNTER — Ambulatory Visit (INDEPENDENT_AMBULATORY_CARE_PROVIDER_SITE_OTHER): Payer: Medicare (Managed Care) | Admitting: Vascular Surgery

## 2018-04-28 ENCOUNTER — Ambulatory Visit (INDEPENDENT_AMBULATORY_CARE_PROVIDER_SITE_OTHER): Payer: Medicare (Managed Care) | Admitting: Vascular Surgery

## 2018-05-05 ENCOUNTER — Ambulatory Visit (INDEPENDENT_AMBULATORY_CARE_PROVIDER_SITE_OTHER): Payer: Medicare (Managed Care) | Admitting: Vascular Surgery

## 2018-07-07 ENCOUNTER — Other Ambulatory Visit (INDEPENDENT_AMBULATORY_CARE_PROVIDER_SITE_OTHER): Payer: Self-pay | Admitting: Vascular Surgery

## 2018-07-07 ENCOUNTER — Encounter

## 2018-07-07 ENCOUNTER — Encounter (INDEPENDENT_AMBULATORY_CARE_PROVIDER_SITE_OTHER): Payer: Self-pay | Admitting: Vascular Surgery

## 2018-07-07 ENCOUNTER — Ambulatory Visit (INDEPENDENT_AMBULATORY_CARE_PROVIDER_SITE_OTHER): Payer: Medicare (Managed Care) | Admitting: Vascular Surgery

## 2018-07-07 ENCOUNTER — Ambulatory Visit (INDEPENDENT_AMBULATORY_CARE_PROVIDER_SITE_OTHER): Payer: Medicare (Managed Care)

## 2018-07-07 VITALS — BP 140/82 | HR 100 | Resp 16 | Ht 64.0 in | Wt 275.0 lb

## 2018-07-07 DIAGNOSIS — N186 End stage renal disease: Secondary | ICD-10-CM | POA: Diagnosis not present

## 2018-07-07 DIAGNOSIS — E785 Hyperlipidemia, unspecified: Secondary | ICD-10-CM

## 2018-07-07 DIAGNOSIS — N184 Chronic kidney disease, stage 4 (severe): Secondary | ICD-10-CM | POA: Diagnosis not present

## 2018-07-07 DIAGNOSIS — I10 Essential (primary) hypertension: Secondary | ICD-10-CM

## 2018-07-07 NOTE — Assessment & Plan Note (Signed)
Likely an underlying cause of her renal failure and blood pressure control important in reducing the progression of atherosclerotic disease. On appropriate oral medications.

## 2018-07-07 NOTE — Progress Notes (Signed)
MRN : [id number]  Sarah Hamilton is a 72 y.o. (12-Jul-1946) female who presents with chief complaint of  Chief Complaint  Patient presents with  . Follow-up    3week HDA  .  History of Present Illness: Patient returns today in follow up of renal insufficiency.  She has not yet started dialysis and says that as far she knows her renal function is stable.  She had a basilic transposition performed about 4 months ago.  This was never used for dialysis and on duplex today appears occluded.  Current Outpatient Medications  Medication Sig Dispense Refill  . acetaminophen (TYLENOL) 650 MG CR tablet Take 650 mg by mouth 3 (three) times daily.    Marland Kitchen amLODipine (NORVASC) 5 MG tablet Take 5 mg by mouth at bedtime.     Marland Kitchen aspirin EC 81 MG tablet Take 81 mg by mouth daily.    Marland Kitchen atorvastatin (LIPITOR) 20 MG tablet Take 20 mg by mouth at bedtime.    . camphor-menthol (SARNA) lotion Apply 1 application topically See admin instructions. Apply to dry skin areas twice daily (scheduled) & Applied to right shoulder as needed for itching    . carvedilol (COREG) 3.125 MG tablet Take 1 tablet (3.125 mg total) by mouth 2 (two) times daily with a meal. 30 tablet 0  . Cholecalciferol (VITAMIN D3) 1000 units CAPS Take 1,000 Units by mouth daily.    . diclofenac sodium (VOLTAREN) 1 % GEL Apply 2-4 g topically 4 (four) times daily as needed (for pain.). Applied to affected joint    . febuxostat (ULORIC) 40 MG tablet Take 40 mg by mouth daily.    . isosorbide mononitrate (IMDUR) 60 MG 24 hr tablet Take 1 tablet (60 mg total) by mouth every evening. 30 tablet 0  . lidocaine (LIDODERM) 5 % Place 1 patch onto the skin daily as needed (for pain in lower back as needed). Remove & Discard patch within 12 hours or as directed by MD    . naloxone (NARCAN) nasal spray 4 mg/0.1 mL Place 1 spray into the nose See admin instructions. Administer a single spray of NARCAN in one nostril. Repeat every 3 minutes as needed if no response.     . nitroGLYCERIN (NITROSTAT) 0.4 MG SL tablet Place 0.4 mg under the tongue every 5 (five) minutes x 3 doses as needed for chest pain.     Marland Kitchen nystatin (NYSTATIN) powder Apply 1 g topically 2 (two) times daily. Sprinkle on clean skin twice daily for rash    . ondansetron (ZOFRAN-ODT) 4 MG disintegrating tablet Take 4 mg by mouth daily as needed for nausea or vomiting.    Marland Kitchen oxyCODONE-acetaminophen (ENDOCET) 5-325 MG tablet Take 1-2 tablets by mouth every 6 (six) hours as needed for severe pain. 30 tablet 0  . pantoprazole (PROTONIX) 40 MG tablet Take 1 tablet (40 mg total) by mouth daily. 30 tablet 0  . promethazine (PHENERGAN) 12.5 MG tablet Take 12.5 mg by mouth every 6 (six) hours as needed for nausea or vomiting.     Marland Kitchen rOPINIRole (REQUIP) 0.25 MG tablet Take 0.25 mg by mouth at bedtime.    . senna-docusate (SENOKOT-S) 8.6-50 MG tablet Take 2 tablets by mouth at bedtime.     . sevelamer (RENAGEL) 800 MG tablet Take 800 mg by mouth 3 (three) times daily with meals.    . torsemide (DEMADEX) 100 MG tablet Take 100 mg by mouth daily.    Marland Kitchen valproic acid (DEPAKENE) 250 MG capsule Take  2 capsules (500 mg total) by mouth 3 (three) times daily. (Patient taking differently: Take 500 mg by mouth 3 (three) times daily. Morning, 1400, & at bedtime) 30 capsule 0  . Zinc Oxide (DESITIN) 13 % CREA Apply 1 application topically 2 (two) times daily. Apply to proximal, medial, inner thigh on left thigh twice daily.    Marland Kitchen ipratropium-albuterol (DUONEB) 0.5-2.5 (3) MG/3ML SOLN Take 3 mLs by nebulization every 4 (four) hours as needed. (Patient not taking: Reported on 03/04/2018) 360 mL 0  . lactulose (CHRONULAC) 10 GM/15ML solution Take 45 mLs (30 g total) by mouth 2 (two) times daily. (Patient not taking: Reported on 03/04/2018) 240 mL 0  . Nutritional Supplements (FEEDING SUPPLEMENT, NEPRO CARB STEADY,) LIQD Take 237 mLs by mouth 2 (two) times daily between meals. (Patient not taking: Reported on 03/04/2018) 60 Can 0  .  oxyCODONE (OXY IR/ROXICODONE) 5 MG immediate release tablet Take 1-2 tablets (5-10 mg total) by mouth every 6 (six) hours as needed for moderate pain or severe pain. (Patient not taking: Reported on 03/04/2018) 30 tablet 0   No current facility-administered medications for this visit.     Past Medical History:  Diagnosis Date  . Anemia   . Anginal pain ([place])   . Arthritis   . CHF (congestive heart failure) ([place])   . CKD (chronic kidney disease), stage IV ([place])   . Coronary artery disease   . Dementia   . Depression   . Diverticulitis   . Diverticulosis   . Dysphagia   . Dyspnea   . Edema    FEET/LEGS  . GERD (gastroesophageal reflux disease)   . Gout   . Heart murmur   . Hepatitis    B  . Hyperlipidemia   . Hyperparathyroidism ([place])   . Hypertension   . Hypothyroidism   . Orthopnea   . Pain    CHRONIC PAIN SYNDROME  . Palpitations   . Restless leg syndrome   . Seizures ([place])    last seizure end of November 2018  . Spinal stenosis   . Tremor    ESSENTIAL  . Wheezing     Past Surgical History:  Procedure Laterality Date  . ABDOMINAL HYSTERECTOMY    . AV FISTULA PLACEMENT Right 10/16/2017   Procedure: ARTERIOVENOUS (AV) FISTULA CREATION (BRACHIOCEPHALIC);  Surgeon: Algernon Huxley, MD;  Location: [facility];  Service: Vascular;  Laterality: Right;  . BASCILIC VEIN TRANSPOSITION Right 03/12/2018   Procedure: BASCILIC VEIN TRANSPOSITION;  Surgeon: Algernon Huxley, MD;  Location: [facility];  Service: Vascular;  Laterality: Right;  . CATARACT EXTRACTION W/PHACO Left 11/07/2016   Procedure: CATARACT EXTRACTION PHACO AND INTRAOCULAR LENS PLACEMENT (IOC);  Surgeon: Eulogio Bear, MD;  Location: [facility];  Service: Ophthalmology;  Laterality: Left;  Lot # [id number] H [place]: 00:38.0 AP%: 10.1 CDE: 3.86  . CATARACT EXTRACTION W/PHACO Right 01/09/2017   Procedure: CATARACT EXTRACTION PHACO AND INTRAOCULAR LENS PLACEMENT (IOC);  Surgeon: Eulogio Bear, MD;  Location: [facility];  Service:  Ophthalmology;  Laterality: Right;  [place] 54.8 AP% 5.4 CDE 2.96 Fluid pack lot # [id number]  . CHEST SURGERY     BENIGN TUMOR  . CHOLECYSTECTOMY    . CORONARY ANGIOPLASTY     STENT  . DIALYSIS/PERMA CATHETER INSERTION N/A 11/11/2017   Procedure: DIALYSIS/PERMA CATHETER INSERTION;  Surgeon: Katha Cabal, MD;  Location: [place] [facility];  Service: Cardiovascular;  Laterality: N/A;  . DIALYSIS/PERMA CATHETER INSERTION N/A 11/17/2017   Procedure: DIALYSIS/PERMA CATHETER  INSERTION;  Surgeon: Algernon Huxley, MD;  Location: [facility] [facility];  Service: Cardiovascular;  Laterality: N/A;  . EYE SURGERY    . JOINT REPLACEMENT Bilateral    TKR  . SHOULDER SURGERY Left    shoulder replacement  . ULTRASOUND GUIDANCE FOR VASCULAR ACCESS  10/16/2017   Procedure: ULTRASOUND GUIDANCE FOR MICROPUNCTURE VENOUS ACCESS;  Surgeon: Algernon Huxley, MD;  Location: [facility];  Service: Vascular;;    Social History Social History   Tobacco Use  . Smoking status: Former Research scientist (life sciences)  . Smokeless tobacco: Never Used  Substance Use Topics  . Alcohol use: No    Alcohol/week: 0.0 standard drinks  . Drug use: No    Family History Family History  Problem Relation Age of Onset  . CAD Mother   . Hypertension Mother   . Stroke Mother   . Kidney failure Mother   . Cancer Father   . Kidney failure Brother   . Kidney failure Son        on hemodialysis in [place]    Allergies  Allergen Reactions  . Pregabalin Other (See Comments)    Sleepy  . Azithromycin Other (See Comments)    Unknown  . Cymbalta [Duloxetine Hcl] Other (See Comments)    Per MAR  . Erythromycin Hives     REVIEW OF SYSTEMS (Negative unless checked)  Constitutional: [] Weight loss  [] Fever  [] Chills Cardiac: [] Chest pain   [] Chest pressure   [] Palpitations   [] Shortness of breath when laying flat   [] Shortness of breath at rest   [] Shortness of breath with exertion. Vascular:  [] Pain in legs with walking   [] Pain in legs at rest    [] Pain in legs when laying flat   [] Claudication   [] Pain in feet when walking  [] Pain in feet at rest  [] Pain in feet when laying flat   [] History of DVT   [] Phlebitis   [x] Swelling in legs   [] Varicose veins   [] Non-healing ulcers Pulmonary:   [] Uses home oxygen   [] Productive cough   [] Hemoptysis   [] Wheeze  [] COPD   [] Asthma Neurologic:  [] Dizziness  [] Blackouts   [] Seizures   [] History of stroke   [] History of TIA  [] Aphasia   [] Temporary blindness   [] Dysphagia   [] Weakness or numbness in arms   [] Weakness or numbness in legs Musculoskeletal:  [x] Arthritis   [] Joint swelling   [] Joint pain   [] Low back pain Hematologic:  [] Easy bruising  [] Easy bleeding   [] Hypercoagulable state   [] Anemic   Gastrointestinal:  [] Blood in stool   [] Vomiting blood  [] Gastroesophageal reflux/heartburn   [] Abdominal pain Genitourinary:  [x] Chronic kidney disease   [] Difficult urination  [] Frequent urination  [] Burning with urination   [] Hematuria Skin:  [] Rashes   [] Ulcers   [] Wounds Psychological:  [] History of anxiety   []  History of major depression.  Physical Examination  BP 140/82 (BP Location: Left Arm)   Pulse 100   Resp 16   Ht 5\' 4"  (1.626 m)   Wt 275 lb (124.7 kg)   BMI 47.20 kg/m  Gen:  WD/WN, NAD. Morbidly obese Head: [place]/AT, No temporalis wasting. Ear/Nose/Throat: Hearing grossly intact, nares w/o erythema or drainage Eyes: Conjunctiva clear. Sclera non-icteric Neck: Supple.  Trachea midline Pulmonary:  Good air movement, no use of accessory muscles.  Cardiac: RRR, no JVD Vascular: no thrill in right arm AVF site Vessel Right Left  Radial Palpable Palpable  Musculoskeletal: M/S 5/5 throughout.  No deformity or atrophy. Mild LE edema. Neurologic: Sensation grossly intact in extremities.  Symmetrical.  Speech is fluent.  Psychiatric: Judgment intact, Mood & affect appropriate for pt's clinical situation. Dermatologic: No rashes or ulcers noted.   No cellulitis or open wounds.       Labs No results found for this or any previous visit (from the past 2160 hour(s)).  Radiology No results found.  Assessment/Plan  HTN (hypertension) Likely an underlying cause of her renal failure and blood pressure control important in reducing the progression of atherosclerotic disease. On appropriate oral medications.   HLD (hyperlipidemia) lipid control important in reducing the progression of atherosclerotic disease. Continue statin therapy   Chronic kidney disease (CKD), stage IV (severe) ([place]) Her basilic transposition has occluded and is not salvageable at this point.  It is not clear when it occluded and it was never used for dialysis.  At this point, she will not really have any other fistula options available for dialysis and I would await her initiation of dialysis to consider placement of an AV graft.  I will await word from her nephrologist at this point.    Leotis Pain, MD  07/07/2018 11:55 AM    This note was created with Dragon medical transcription system.  Any errors from dictation are purely unintentional

## 2018-07-07 NOTE — Assessment & Plan Note (Signed)
lipid control important in reducing the progression of atherosclerotic disease. Continue statin therapy  

## 2018-07-07 NOTE — Assessment & Plan Note (Signed)
Her basilic transposition has occluded and is not salvageable at this point.  It is not clear when it occluded and it was never used for dialysis.  At this point, she will not really have any other fistula options available for dialysis and I would await her initiation of dialysis to consider placement of an AV graft.  I will await word from her nephrologist at this point.

## 2018-07-08 ENCOUNTER — Encounter (INDEPENDENT_AMBULATORY_CARE_PROVIDER_SITE_OTHER): Payer: Self-pay

## 2018-07-08 ENCOUNTER — Telehealth (INDEPENDENT_AMBULATORY_CARE_PROVIDER_SITE_OTHER): Payer: Self-pay

## 2018-07-08 NOTE — Telephone Encounter (Signed)
Lorre Nick called and stated that the patient was seen yesterday by Maudie Mercury, and that she knows that the patient is occluded, but she did want you to know that the patient has been receiving dialysis since May and that we should go ahead and proceed with getting her on the schedule for a graft to be places as soon as possible.  Lorre Nick states that she would like a call back to ensure that this is being handled. She can be reached at [phone].

## 2018-07-08 NOTE — Telephone Encounter (Signed)
Patient is scheduled for a Left arm AVG on 07/23/18.

## 2018-07-08 NOTE — Telephone Encounter (Signed)
Unfortunately, I did not see this patient. Please touch base with the appropriate provider - I am unaware of the plan.

## 2018-07-10 ENCOUNTER — Other Ambulatory Visit (INDEPENDENT_AMBULATORY_CARE_PROVIDER_SITE_OTHER): Payer: Self-pay | Admitting: Nurse Practitioner

## 2018-07-16 ENCOUNTER — Encounter
Admission: RE | Admit: 2018-07-16 | Discharge: 2018-07-16 | Disposition: A | Discharge status: Home / Self Care | Payer: Medicare (Managed Care) | Source: Ambulatory Visit | Attending: Vascular Surgery | Admitting: Vascular Surgery

## 2018-07-16 ENCOUNTER — Other Ambulatory Visit: Payer: Self-pay

## 2018-07-16 DIAGNOSIS — Z01812 Encounter for preprocedural laboratory examination: Secondary | ICD-10-CM | POA: Diagnosis not present

## 2018-07-16 NOTE — Patient Instructions (Signed)
Your procedure is scheduled on: Thursday 07/23/18  Report to [facility]. To find out your arrival time please call [phone] between 1PM - 3PM on Wednesday 07/22/18  Remember: Instructions that are not followed completely may result in serious medical risk, up to and including death, or upon the discretion of your surgeon and anesthesiologist your surgery may need to be rescheduled.     _X__ 1. Do not eat food after midnight the night before your procedure.                 No gum chewing or hard candies. You may drink clear liquids up to 2 hours                 before you are scheduled to arrive for your surgery- DO NOT drink clear                 liquids within 2 hours of the start of your surgery.                 Clear Liquids include:  water, apple juice without pulp, clear carbohydrate                 drink such as Clearfast or Gatorade, Black Coffee or Tea (Do not add                 anything to coffee or tea).  __X__2.  On the morning of surgery brush your teeth with toothpaste and water, you may rinse your mouth with mouthwash if you wish.  Do not swallow any toothpaste of mouthwash.     _X__ 3.  No Alcohol for 24 hours before or after surgery.   _X__ 4.  Do Not Smoke or use e-cigarettes For 24 Hours Prior to Your Surgery.                 Do not use any chewable tobacco products for at least 6 hours prior to                 surgery.  ____  5.  Bring all medications with you on the day of surgery if instructed.   __X__  6.  Notify your doctor if there is any change in your medical condition      (cold, fever, infections).     Do not wear jewelry, make-up, hairpins, clips or nail polish. Do not wear lotions, powders, or perfumes.  Do not shave 48 hours prior to surgery. Men may shave face and neck. Do not bring valuables to the hospital.    [facility] is not responsible for any belongings or valuables.  Contacts,  dentures/partials or body piercings may not be worn into surgery. Bring a case for your contacts, glasses or hearing aids, a denture cup will be supplied. Leave your suitcase in the car. After surgery it may be brought to your room. For patients admitted to the hospital, discharge time is determined by your treatment team.   Patients discharged the day of surgery will not be allowed to drive home.   Please read over the following fact sheets that you were given:   MRSA Information  __X__ Take these medicines the morning of surgery with A SIP OF WATER:     1. acetaminophen (TYLENOL) 650 MG CR tablet  2. carvedilol (COREG) 3.125 MG tablet  3. febuxostat (ULORIC) 40 MG tablet  4. pantoprazole (PROTONIX) 40 MG  tablet  5. isosorbide mononitrate (IMDUR) 60 MG 24 hr tablet  6. valproic acid (DEPAKENE) 250 MG capsule   __X__ Use SAGE wipes as directed   __X__ Stop Blood Thinners Coumadin/Plavix/Xarelto/Pleta/Pradaxa/Eliquis/Effient/Aspirin Today  __X__ Stop Anti-inflammatories 7 days before surgery such as Advil, Ibuprofen, Motrin, BC or Goodies Powder, Naprosyn, Naproxen, Aleve, Aspirin, Meloxicam. May take Tylenol if needed for pain or discomfort.   __X__ Stop all herbal supplements, fish oil or vitamin E until after surgery.

## 2018-07-16 NOTE — Pre-Procedure Instructions (Addendum)
The patient refused to have labs drawn here as she states she is a very hard stick and no one is ever able to get blood other than at dialysis. I have left a message with Mickel Baas at Dr. Bunnie Domino office regarding this.  1438: I spoke with Mickel Baas. I will fax the orders to [facility]. [phone]) I called Legrand Como at [employer] [phone]) to let him know. He will let me know if it is possible for her labs to be drawn there. Fax confirmation received.  1516: Legrand Como with [facility] will fax the labs that he has available tomorrow. Some may not be faxed until Tuesday 07/21/18. When we receive the results from him, they will be faxed to Dr. Bunnie Domino office. Mickel Baas is aware. If lab results are not sufficient, they may need to be drawn morning of surgery. Her Type & Screen will have to be done the morning of surgery.   1658: Faxed results received from [facility] to Dr. Lucky Cowboy. Fax confirmation received.   *[place] is now known as [place].

## 2018-07-23 ENCOUNTER — Ambulatory Visit (INDEPENDENT_AMBULATORY_CARE_PROVIDER_SITE_OTHER): Payer: Medicare (Managed Care) | Admitting: Vascular Surgery

## 2018-07-23 ENCOUNTER — Encounter (INDEPENDENT_AMBULATORY_CARE_PROVIDER_SITE_OTHER): Payer: Medicare (Managed Care)

## 2018-07-23 ENCOUNTER — Ambulatory Visit
Admission: RE | Admit: 2018-07-23 | Payer: Medicare (Managed Care) | Source: Ambulatory Visit | Admitting: Vascular Surgery

## 2018-07-23 ENCOUNTER — Encounter

## 2018-07-23 ENCOUNTER — Encounter: Admission: RE | Payer: Self-pay | Source: Ambulatory Visit

## 2018-07-23 SURGERY — INSERTION OF ARTERIOVENOUS (AV) GORE-TEX GRAFT ARM
Anesthesia: General | Laterality: Left

## 2018-07-24 ENCOUNTER — Encounter (INDEPENDENT_AMBULATORY_CARE_PROVIDER_SITE_OTHER): Payer: Self-pay

## 2018-07-31 ENCOUNTER — Other Ambulatory Visit (INDEPENDENT_AMBULATORY_CARE_PROVIDER_SITE_OTHER): Payer: Self-pay | Admitting: Nurse Practitioner

## 2018-08-05 MED ORDER — CEFAZOLIN SODIUM-DEXTROSE 1-4 GM/50ML-% IV SOLN
1.0000 g | INTRAVENOUS | Status: AC
  Administered 2018-08-06: 1 g via INTRAVENOUS

## 2018-08-06 ENCOUNTER — Encounter
Admission: RE | Disposition: A | Discharge status: Home / Self Care | Payer: Self-pay | Source: Ambulatory Visit | Attending: Vascular Surgery

## 2018-08-06 ENCOUNTER — Encounter: Payer: Self-pay | Admitting: *Deleted

## 2018-08-06 ENCOUNTER — Ambulatory Visit
Admission: RE | Admit: 2018-08-06 | Discharge: 2018-08-06 | Disposition: A | Discharge status: Home / Self Care | Payer: Medicare (Managed Care) | Source: Ambulatory Visit | Attending: Vascular Surgery | Admitting: Vascular Surgery

## 2018-08-06 ENCOUNTER — Ambulatory Visit: Payer: Medicare (Managed Care) | Admitting: Anesthesiology

## 2018-08-06 ENCOUNTER — Other Ambulatory Visit: Payer: Self-pay

## 2018-08-06 DIAGNOSIS — D631 Anemia in chronic kidney disease: Secondary | ICD-10-CM | POA: Diagnosis not present

## 2018-08-06 DIAGNOSIS — G894 Chronic pain syndrome: Secondary | ICD-10-CM | POA: Diagnosis not present

## 2018-08-06 DIAGNOSIS — I25119 Atherosclerotic heart disease of native coronary artery with unspecified angina pectoris: Secondary | ICD-10-CM | POA: Insufficient documentation

## 2018-08-06 DIAGNOSIS — Z79899 Other long term (current) drug therapy: Secondary | ICD-10-CM | POA: Diagnosis not present

## 2018-08-06 DIAGNOSIS — Z9841 Cataract extraction status, right eye: Secondary | ICD-10-CM | POA: Insufficient documentation

## 2018-08-06 DIAGNOSIS — R0601 Orthopnea: Secondary | ICD-10-CM | POA: Insufficient documentation

## 2018-08-06 DIAGNOSIS — Z6841 Body Mass Index (BMI) 40.0 and over, adult: Secondary | ICD-10-CM | POA: Insufficient documentation

## 2018-08-06 DIAGNOSIS — R1310 Dysphagia, unspecified: Secondary | ICD-10-CM | POA: Insufficient documentation

## 2018-08-06 DIAGNOSIS — G2581 Restless legs syndrome: Secondary | ICD-10-CM | POA: Insufficient documentation

## 2018-08-06 DIAGNOSIS — F0390 Unspecified dementia without behavioral disturbance: Secondary | ICD-10-CM | POA: Insufficient documentation

## 2018-08-06 DIAGNOSIS — E039 Hypothyroidism, unspecified: Secondary | ICD-10-CM | POA: Insufficient documentation

## 2018-08-06 DIAGNOSIS — I132 Hypertensive heart and chronic kidney disease with heart failure and with stage 5 chronic kidney disease, or end stage renal disease: Secondary | ICD-10-CM | POA: Insufficient documentation

## 2018-08-06 DIAGNOSIS — F329 Major depressive disorder, single episode, unspecified: Secondary | ICD-10-CM | POA: Insufficient documentation

## 2018-08-06 DIAGNOSIS — Z9049 Acquired absence of other specified parts of digestive tract: Secondary | ICD-10-CM | POA: Insufficient documentation

## 2018-08-06 DIAGNOSIS — R062 Wheezing: Secondary | ICD-10-CM | POA: Diagnosis not present

## 2018-08-06 DIAGNOSIS — M109 Gout, unspecified: Secondary | ICD-10-CM | POA: Diagnosis not present

## 2018-08-06 DIAGNOSIS — R002 Palpitations: Secondary | ICD-10-CM | POA: Diagnosis not present

## 2018-08-06 DIAGNOSIS — Z87891 Personal history of nicotine dependence: Secondary | ICD-10-CM | POA: Insufficient documentation

## 2018-08-06 DIAGNOSIS — R251 Tremor, unspecified: Secondary | ICD-10-CM | POA: Insufficient documentation

## 2018-08-06 DIAGNOSIS — K5790 Diverticulosis of intestine, part unspecified, without perforation or abscess without bleeding: Secondary | ICD-10-CM | POA: Insufficient documentation

## 2018-08-06 DIAGNOSIS — I509 Heart failure, unspecified: Secondary | ICD-10-CM | POA: Diagnosis not present

## 2018-08-06 DIAGNOSIS — M1990 Unspecified osteoarthritis, unspecified site: Secondary | ICD-10-CM | POA: Insufficient documentation

## 2018-08-06 DIAGNOSIS — Z96653 Presence of artificial knee joint, bilateral: Secondary | ICD-10-CM | POA: Insufficient documentation

## 2018-08-06 DIAGNOSIS — Z90710 Acquired absence of both cervix and uterus: Secondary | ICD-10-CM | POA: Insufficient documentation

## 2018-08-06 DIAGNOSIS — R011 Cardiac murmur, unspecified: Secondary | ICD-10-CM | POA: Diagnosis not present

## 2018-08-06 DIAGNOSIS — E213 Hyperparathyroidism, unspecified: Secondary | ICD-10-CM | POA: Diagnosis not present

## 2018-08-06 DIAGNOSIS — Z7982 Long term (current) use of aspirin: Secondary | ICD-10-CM | POA: Diagnosis not present

## 2018-08-06 DIAGNOSIS — Z8619 Personal history of other infectious and parasitic diseases: Secondary | ICD-10-CM | POA: Diagnosis not present

## 2018-08-06 DIAGNOSIS — E785 Hyperlipidemia, unspecified: Secondary | ICD-10-CM | POA: Diagnosis not present

## 2018-08-06 DIAGNOSIS — N186 End stage renal disease: Secondary | ICD-10-CM | POA: Diagnosis not present

## 2018-08-06 DIAGNOSIS — Z9842 Cataract extraction status, left eye: Secondary | ICD-10-CM | POA: Insufficient documentation

## 2018-08-06 DIAGNOSIS — R569 Unspecified convulsions: Secondary | ICD-10-CM | POA: Insufficient documentation

## 2018-08-06 DIAGNOSIS — K219 Gastro-esophageal reflux disease without esophagitis: Secondary | ICD-10-CM | POA: Insufficient documentation

## 2018-08-06 DIAGNOSIS — Z955 Presence of coronary angioplasty implant and graft: Secondary | ICD-10-CM | POA: Insufficient documentation

## 2018-08-06 HISTORY — PX: AV FISTULA PLACEMENT: SHX1204

## 2018-08-06 LAB — CBC WITH DIFFERENTIAL/PLATELET
ABS IMMATURE GRANULOCYTES: 0.04 10*3/uL (ref 0.00–0.07)
BASOS PCT: 0 %
Basophils Absolute: 0 10*3/uL (ref 0.0–0.1)
EOS PCT: 3 %
Eosinophils Absolute: 0.2 10*3/uL (ref 0.0–0.5)
HCT: 32.3 % — ABNORMAL LOW (ref 36.0–46.0)
HEMOGLOBIN: 10.4 g/dL — AB (ref 12.0–15.0)
Immature Granulocytes: 1 %
LYMPHS PCT: 30 %
Lymphs Abs: 1.7 10*3/uL (ref 0.7–4.0)
MCH: 32.4 pg (ref 26.0–34.0)
MCHC: 32.2 g/dL (ref 30.0–36.0)
MCV: 100.6 fL — ABNORMAL HIGH (ref 80.0–100.0)
MONO ABS: 0.6 10*3/uL (ref 0.1–1.0)
Monocytes Relative: 11 %
NEUTROS ABS: 3 10*3/uL (ref 1.7–7.7)
Neutrophils Relative %: 55 %
Platelets: 225 10*3/uL (ref 150–400)
RBC: 3.21 MIL/uL — ABNORMAL LOW (ref 3.87–5.11)
RDW: 13.5 % (ref 11.5–15.5)
WBC: 5.5 10*3/uL (ref 4.0–10.5)
nRBC: 0 % (ref 0.0–0.2)

## 2018-08-06 LAB — BASIC METABOLIC PANEL
Anion gap: 15 (ref 5–15)
BUN: 21 mg/dL (ref 8–23)
CO2: 28 mmol/L (ref 22–32)
CREATININE: 4.59 mg/dL — AB (ref 0.44–1.00)
Calcium: 8.5 mg/dL — ABNORMAL LOW (ref 8.9–10.3)
Chloride: 95 mmol/L — ABNORMAL LOW (ref 98–111)
GFR calc Af Amer: 10 mL/min — ABNORMAL LOW (ref >60)
GFR, EST NON AFRICAN AMERICAN: 9 mL/min — AB (ref >60)
GLUCOSE: 85 mg/dL (ref 70–99)
POTASSIUM: 2.9 mmol/L — AB (ref 3.5–5.1)
Sodium: 138 mmol/L (ref 135–145)

## 2018-08-06 LAB — TYPE AND SCREEN
ABO/RH(D): O POS
Antibody Screen: NEGATIVE

## 2018-08-06 LAB — APTT: APTT: 31 s (ref 24–36)

## 2018-08-06 LAB — PROTIME-INR
INR: 0.98
Prothrombin Time: 12.9 seconds (ref 11.4–15.2)

## 2018-08-06 LAB — POCT I-STAT 4, (NA,K, GLUC, HGB,HCT)
Glucose, Bld: 80 mg/dL (ref 70–99)
HCT: 29 % — ABNORMAL LOW (ref 36.0–46.0)
HEMOGLOBIN: 9.9 g/dL — AB (ref 12.0–15.0)
POTASSIUM: 4.3 mmol/L (ref 3.5–5.1)
Sodium: 133 mmol/L — ABNORMAL LOW (ref 135–145)

## 2018-08-06 SURGERY — INSERTION OF ARTERIOVENOUS (AV) GORE-TEX GRAFT ARM
Anesthesia: General | Laterality: Left

## 2018-08-06 MED ORDER — FENTANYL CITRATE (PF) 100 MCG/2ML IJ SOLN
25.0000 ug | INTRAMUSCULAR | Status: AC | PRN
  Administered 2018-08-06 (×6): 25 ug via INTRAVENOUS

## 2018-08-06 MED ORDER — EVICEL 2 ML EX KIT
PACK | CUTANEOUS | Status: AC
  Filled 2018-08-06: qty 1

## 2018-08-06 MED ORDER — MIDAZOLAM HCL 2 MG/2ML IJ SOLN
INTRAMUSCULAR | Status: AC
  Filled 2018-08-06: qty 2

## 2018-08-06 MED ORDER — ONDANSETRON HCL 4 MG/2ML IJ SOLN
INTRAMUSCULAR | Status: DC | PRN
  Administered 2018-08-06: 4 mg via INTRAVENOUS

## 2018-08-06 MED ORDER — PROPOFOL 10 MG/ML IV BOLUS
INTRAVENOUS | Status: DC | PRN
  Administered 2018-08-06: 100 mg via INTRAVENOUS

## 2018-08-06 MED ORDER — SUGAMMADEX SODIUM 200 MG/2ML IV SOLN
INTRAVENOUS | Status: DC | PRN
  Administered 2018-08-06: 250 mg via INTRAVENOUS

## 2018-08-06 MED ORDER — HEPARIN SODIUM (PORCINE) 1000 UNIT/ML IJ SOLN
INTRAMUSCULAR | Status: AC
  Filled 2018-08-06: qty 1

## 2018-08-06 MED ORDER — EVICEL 2 ML EX KIT
PACK | CUTANEOUS | Status: DC | PRN
  Administered 2018-08-06: 2 mL

## 2018-08-06 MED ORDER — HEPARIN SODIUM (PORCINE) 5000 UNIT/ML IJ SOLN
INTRAMUSCULAR | Status: AC
  Filled 2018-08-06: qty 1

## 2018-08-06 MED ORDER — SUGAMMADEX SODIUM 200 MG/2ML IV SOLN
INTRAVENOUS | Status: AC
  Filled 2018-08-06: qty 2

## 2018-08-06 MED ORDER — BUPIVACAINE-EPINEPHRINE (PF) 0.5% -1:200000 IJ SOLN
INTRAMUSCULAR | Status: AC
  Filled 2018-08-06: qty 30

## 2018-08-06 MED ORDER — CEFAZOLIN SODIUM-DEXTROSE 1-4 GM/50ML-% IV SOLN
INTRAVENOUS | Status: AC
  Filled 2018-08-06: qty 50

## 2018-08-06 MED ORDER — HYDROCODONE-ACETAMINOPHEN 5-325 MG PO TABS: 1.0000 | ORAL_TABLET | Freq: Four times a day (QID) | ORAL | 0 refills | Status: AC | PRN

## 2018-08-06 MED ORDER — PHENYLEPHRINE HCL 10 MG/ML IJ SOLN
INTRAMUSCULAR | Status: DC | PRN
  Administered 2018-08-06 (×2): 100 ug via INTRAVENOUS
  Administered 2018-08-06: 200 ug via INTRAVENOUS

## 2018-08-06 MED ORDER — FAMOTIDINE 20 MG PO TABS
20.0000 mg | ORAL_TABLET | Freq: Once | ORAL | Status: AC
  Administered 2018-08-06: 20 mg via ORAL

## 2018-08-06 MED ORDER — MIDAZOLAM HCL 2 MG/2ML IJ SOLN
INTRAMUSCULAR | Status: DC | PRN
  Administered 2018-08-06: 2 mg via INTRAVENOUS

## 2018-08-06 MED ORDER — FENTANYL CITRATE (PF) 100 MCG/2ML IJ SOLN
INTRAMUSCULAR | Status: AC
  Administered 2018-08-06: 25 ug via INTRAVENOUS
  Filled 2018-08-06: qty 2

## 2018-08-06 MED ORDER — POTASSIUM CHLORIDE CRYS ER 20 MEQ PO TBCR
40.0000 meq | EXTENDED_RELEASE_TABLET | Freq: Once | ORAL | Status: AC
  Administered 2018-08-06: 40 meq via ORAL

## 2018-08-06 MED ORDER — LACTATED RINGERS IV SOLN: INTRAVENOUS | Status: DC

## 2018-08-06 MED ORDER — SODIUM CHLORIDE 0.9 % IV SOLN
INTRAVENOUS | Status: DC
  Administered 2018-08-06: 11:00:00 via INTRAVENOUS

## 2018-08-06 MED ORDER — HYDROCODONE-ACETAMINOPHEN 5-325 MG PO TABS
ORAL_TABLET | ORAL | Status: AC
  Filled 2018-08-06: qty 1

## 2018-08-06 MED ORDER — ROCURONIUM BROMIDE 100 MG/10ML IV SOLN
INTRAVENOUS | Status: DC | PRN
  Administered 2018-08-06 (×2): 10 mg via INTRAVENOUS
  Administered 2018-08-06: 40 mg via INTRAVENOUS

## 2018-08-06 MED ORDER — ONDANSETRON HCL 4 MG/2ML IJ SOLN: 4.0000 mg | Freq: Once | INTRAMUSCULAR | Status: DC | PRN

## 2018-08-06 MED ORDER — DEXAMETHASONE SODIUM PHOSPHATE 10 MG/ML IJ SOLN
INTRAMUSCULAR | Status: AC
  Filled 2018-08-06: qty 1

## 2018-08-06 MED ORDER — POTASSIUM CHLORIDE CRYS ER 20 MEQ PO TBCR
EXTENDED_RELEASE_TABLET | ORAL | Status: AC
  Administered 2018-08-06: 40 meq via ORAL
  Filled 2018-08-06: qty 2

## 2018-08-06 MED ORDER — FAMOTIDINE 20 MG PO TABS
ORAL_TABLET | ORAL | Status: AC
  Administered 2018-08-06: 20 mg via ORAL
  Filled 2018-08-06: qty 1

## 2018-08-06 MED ORDER — PHENYLEPHRINE HCL 10 MG/ML IJ SOLN
INTRAMUSCULAR | Status: AC
  Filled 2018-08-06: qty 1

## 2018-08-06 MED ORDER — FENTANYL CITRATE (PF) 100 MCG/2ML IJ SOLN
INTRAMUSCULAR | Status: DC | PRN
  Administered 2018-08-06: 50 ug via INTRAVENOUS

## 2018-08-06 MED ORDER — HYDROCODONE-ACETAMINOPHEN 5-325 MG PO TABS
1.0000 | ORAL_TABLET | Freq: Four times a day (QID) | ORAL | Status: DC | PRN
  Administered 2018-08-06: 1 via ORAL

## 2018-08-06 MED ORDER — CARVEDILOL 3.125 MG PO TABS
3.1250 mg | ORAL_TABLET | Freq: Once | ORAL | Status: AC
  Administered 2018-08-06: 3.125 mg via ORAL
  Filled 2018-08-06: qty 1

## 2018-08-06 MED ORDER — FENTANYL CITRATE (PF) 100 MCG/2ML IJ SOLN
INTRAMUSCULAR | Status: AC
  Filled 2018-08-06: qty 2

## 2018-08-06 MED ORDER — PROPOFOL 10 MG/ML IV BOLUS
INTRAVENOUS | Status: AC
  Filled 2018-08-06: qty 20

## 2018-08-06 MED ORDER — HEPARIN SODIUM (PORCINE) 1000 UNIT/ML IJ SOLN
INTRAMUSCULAR | Status: DC | PRN
  Administered 2018-08-06: 3000 [IU] via INTRAVENOUS

## 2018-08-06 MED ORDER — DEXAMETHASONE SODIUM PHOSPHATE 10 MG/ML IJ SOLN
INTRAMUSCULAR | Status: DC | PRN
  Administered 2018-08-06: 10 mg via INTRAVENOUS

## 2018-08-06 MED ORDER — HYDROMORPHONE HCL 1 MG/ML IJ SOLN: 1.0000 mg | Freq: Once | INTRAMUSCULAR | Status: DC | PRN

## 2018-08-06 MED ORDER — ONDANSETRON HCL 4 MG/2ML IJ SOLN
INTRAMUSCULAR | Status: AC
  Filled 2018-08-06: qty 2

## 2018-08-06 MED ORDER — ONDANSETRON HCL 4 MG/2ML IJ SOLN: 4.0000 mg | Freq: Four times a day (QID) | INTRAMUSCULAR | Status: DC | PRN

## 2018-08-06 SURGICAL SUPPLY — 60 items
BAG DECANTER FOR FLEXI CONT (MISCELLANEOUS) ×3 IMPLANT
BLADE SURG SZ11 CARB STEEL (BLADE) ×3 IMPLANT
BOOT SUTURE AID YELLOW STND (SUTURE) ×3 IMPLANT
BRUSH SCRUB EZ  4% CHG (MISCELLANEOUS) ×2
BRUSH SCRUB EZ 4% CHG (MISCELLANEOUS) ×1 IMPLANT
CANISTER SUCT 1200ML W/VALVE (MISCELLANEOUS) ×3 IMPLANT
CHLORAPREP W/TINT 26ML (MISCELLANEOUS) ×3 IMPLANT
CLIP SPRNG 6 S-JAW DBL (CLIP) ×1 IMPLANT
CLIP SPRNG 6MM S-JAW DBL (CLIP) ×3
COVER WAND RF STERILE (DRAPES) ×3 IMPLANT
DECANTER SPIKE VIAL GLASS SM (MISCELLANEOUS) ×3 IMPLANT
DERMABOND ADVANCED (GAUZE/BANDAGES/DRESSINGS) ×2
DERMABOND ADVANCED .7 DNX12 (GAUZE/BANDAGES/DRESSINGS) ×1 IMPLANT
ELECT CAUTERY BLADE 6.4 (BLADE) ×3 IMPLANT
ELECT REM PT RETURN 9FT ADLT (ELECTROSURGICAL) ×3
ELECTRODE REM PT RTRN 9FT ADLT (ELECTROSURGICAL) ×1 IMPLANT
GEL ULTRASOUND 20GR AQUASONIC (MISCELLANEOUS) IMPLANT
GLOVE BIO SURGEON STRL SZ7 (GLOVE) ×6 IMPLANT
GLOVE INDICATOR 7.5 STRL GRN (GLOVE) ×3 IMPLANT
GOWN STRL REUS W/ TWL LRG LVL3 (GOWN DISPOSABLE) ×1 IMPLANT
GOWN STRL REUS W/ TWL XL LVL3 (GOWN DISPOSABLE) ×2 IMPLANT
GOWN STRL REUS W/TWL LRG LVL3 (GOWN DISPOSABLE) ×2
GOWN STRL REUS W/TWL XL LVL3 (GOWN DISPOSABLE) ×4
GRAFT PROPATEN STD WALL 6X40 (Vascular Products) ×2 IMPLANT
HEMOSTAT SURGICEL 2X3 (HEMOSTASIS) ×3 IMPLANT
IV NS 500ML (IV SOLUTION) ×2
IV NS 500ML BAXH (IV SOLUTION) ×1 IMPLANT
KIT TURNOVER KIT A (KITS) ×3 IMPLANT
LABEL OR SOLS (LABEL) ×3 IMPLANT
LOOP RED MAXI  1X406MM (MISCELLANEOUS) ×2
LOOP VESSEL MAXI 1X406 RED (MISCELLANEOUS) ×1 IMPLANT
LOOP VESSEL MINI 0.8X406 BLUE (MISCELLANEOUS) ×1 IMPLANT
LOOPS BLUE MINI 0.8X406MM (MISCELLANEOUS) ×2
NDL FILTER BLUNT 18X1 1/2 (NEEDLE) ×1 IMPLANT
NDL HYPO 30X.5 LL (NEEDLE) IMPLANT
NEEDLE FILTER BLUNT 18X 1/2SAF (NEEDLE) ×2
NEEDLE FILTER BLUNT 18X1 1/2 (NEEDLE) ×1 IMPLANT
NEEDLE HYPO 30X.5 LL (NEEDLE) IMPLANT
NS IRRIG 500ML POUR BTL (IV SOLUTION) ×3 IMPLANT
PACK EXTREMITY ARMC (MISCELLANEOUS) ×3 IMPLANT
PAD PREP 24X41 OB/GYN DISP (PERSONAL CARE ITEMS) ×3 IMPLANT
SOLUTION CELL SAVER (CLIP) ×1 IMPLANT
STOCKINETTE 48X4 2 PLY STRL (GAUZE/BANDAGES/DRESSINGS) ×1 IMPLANT
STOCKINETTE STRL 4IN 9604848 (GAUZE/BANDAGES/DRESSINGS) ×3 IMPLANT
SUT GORETEX CV-6TTC-13 36IN (SUTURE) ×6 IMPLANT
SUT MNCRL AB 4-0 PS2 18 (SUTURE) ×3 IMPLANT
SUT PROLENE 6 0 BV (SUTURE) ×12 IMPLANT
SUT SILK 0 SH 30 (SUTURE) ×3 IMPLANT
SUT SILK 2 0 (SUTURE) ×2
SUT SILK 2-0 18XBRD TIE 12 (SUTURE) ×1 IMPLANT
SUT SILK 3 0 (SUTURE) ×2
SUT SILK 3-0 18XBRD TIE 12 (SUTURE) ×1 IMPLANT
SUT SILK 4 0 (SUTURE) ×2
SUT SILK 4-0 18XBRD TIE 12 (SUTURE) ×1 IMPLANT
SUT VIC AB 3-0 SH 27 (SUTURE) ×2
SUT VIC AB 3-0 SH 27X BRD (SUTURE) ×1 IMPLANT
SYR 20CC LL (SYRINGE) ×3 IMPLANT
SYR 3ML LL SCALE MARK (SYRINGE) ×3 IMPLANT
SYR TB 1ML 27GX1/2 LL (SYRINGE) IMPLANT
TOWEL OR 17X26 4PK STRL BLUE (TOWEL DISPOSABLE) ×3 IMPLANT

## 2018-08-06 NOTE — Transfer of Care (Signed)
Immediate Anesthesia Transfer of Care Note  Patient: Sarah Hamilton  Procedure(s) Performed: INSERTION OF ARTERIOVENOUS (AV) GORE-TEX GRAFT ARM (Left )  Patient Location: PACU  Anesthesia Type:General  Level of Consciousness: sedated  Airway & Oxygen Therapy: Patient Spontanous Breathing and Patient connected to face mask oxygen  Post-op Assessment: Report given to RN and Post -op Vital signs reviewed and stable  Post vital signs: Reviewed and stable  Last Vitals:  Vitals Value Taken Time  BP 147/84 08/06/2018  3:32 PM  Temp    Pulse 86 08/06/2018  3:37 PM  Resp 19 08/06/2018  3:37 PM  SpO2 99 % 08/06/2018  3:37 PM  Vitals shown include unvalidated device data.  Last Pain:  Vitals:   08/06/18 1010  TempSrc: Tympanic         Complications: No apparent anesthesia complications

## 2018-08-06 NOTE — Anesthesia Post-op Follow-up Note (Signed)
Anesthesia QCDR form completed.        

## 2018-08-06 NOTE — Discharge Instructions (Addendum)

## 2018-08-06 NOTE — Anesthesia Preprocedure Evaluation (Addendum)
Anesthesia Evaluation  Patient identified by MRN, date of birth, ID band Patient awake    Reviewed: Allergy & Precautions, NPO status , Patient's Chart, lab work & pertinent test results, reviewed documented beta blocker date and time   Airway Mallampati: III  TM Distance: >3 FB     Dental  (+) Chipped   Pulmonary shortness of breath, former smoker,    Pulmonary exam normal        Cardiovascular hypertension, Pt. on medications + angina + CAD, +CHF and + Orthopnea  Normal cardiovascular exam+ Valvular Problems/Murmurs      Neuro/Psych Seizures -,  PSYCHIATRIC DISORDERS Depression Dementia    GI/Hepatic GERD  Controlled,(+) Hepatitis -  Endo/Other  Hypothyroidism Morbid obesity  Renal/GU ESRF and DialysisRenal disease     Musculoskeletal  (+) Arthritis ,   Abdominal (+) + obese,   Peds  Hematology  (+) anemia ,   Anesthesia Other Findings Gout.Last seizure. Hb 8. On depakote.  Reproductive/Obstetrics                             Anesthesia Physical  Anesthesia Plan  ASA: III  Anesthesia Plan: General   Post-op Pain Management:    Induction: Intravenous  PONV Risk Score and Plan:   Airway Management Planned: Oral ETT  Additional Equipment:   Intra-op Plan:   Post-operative Plan:   Informed Consent: I have reviewed the patients History and Physical, chart, labs and discussed the procedure including the risks, benefits and alternatives for the proposed anesthesia with the patient or authorized representative who has indicated his/her understanding and acceptance.     Plan Discussed with: CRNA  Anesthesia Plan Comments:         Anesthesia Quick Evaluation

## 2018-08-06 NOTE — Op Note (Addendum)
OPERATIVE NOTE   PROCEDURE:  Left brachial artery to brachial vein forearm loop arteriovenous graft with  6 mm pTFE  PRE-OPERATIVE DIAGNOSIS: 1. ESRD   POST-OPERATIVE DIAGNOSIS: same as above  SURGEON: Leotis Pain, MD  ASSISTANT(S): Hezzie Bump, PA-C  ANESTHESIA: general  ESTIMATED BLOOD LOSS: 50 cc  FINDING(S): 1. none  SPECIMEN(S):  none  INDICATIONS:   Sarah Hamilton is a 72 y.o. female who  presents with ESRD and need for permanent dialysis access.  Risk, benefits, and alternatives to access surgery were discussed.  The difference between AV fistulas and AV grafts were discussed.  The patient is aware the risks include but are not limited to: bleeding, infection, steal syndrome, nerve damage, ischemic monomelic neuropathy, failure to mature, and need for additional procedures.  The patient is aware of the risks and elects to proceed forward. An assistant was present during the procedure to help facilitate the exposure and expedite the procedure.  DESCRIPTION: After full informed written consent was obtained from the patient, the patient was brought back to the operating room and placed supine upon the operating table.  The patientwas given IV antibiotics prior to proceeding.  After obtaining adequate general anesthesia, the patient was prepped and draped in standard fashion. The assistant provided retraction and mobilization to help facilitate exposure and expedite the procedure throughout the entire procedure.  This included following suture, using retractors, assisting with closure, and optimizing lighting.   I made a transverse incision at the antecubital fossa and dissected down through the subcutaneous tissue and fascia.  The brachial artery was dissected proximally and distally and vessel loops placed for control.  The artery was patent and adequate sized for AV graft creation.  One of the brachial veins was in close proximity and was found to be patent and of adequate  size for AV graft creation.  I dissected it out and placed a vessel loop around the vein, and would later control this with bulldog clamps.  I then obtained a 6 mm Propaten pTFE graft, which I stretched to full length to help determine the apex of this looped forearm arteriovenous graft.  I made a transverse incision at the determined apex site and dissected down out a pocket.  I then took a curved metal tunneler and dissected from the antecubital incision up to the apical incision.  I delivered the graft through the metal tunnel, taking care to maintain the orientation of the graft.  I then tunneled from the apex to the antecubital incision, leaving the metal tunnel in place and delivered the remainder of the graft through the tunnel.   I then gave the patient 3000 units of heparin to gain some anticoagulation, and allowed this to circulate for several minutes. I placed the brachial artery under tension proximally and distally with vessel loops.  I made an arteriotomy with a 11 blade and extended it with a Potts scissor for.  The graft was then cut and beveled to match the arteriotomy.  The graft was sewn to the artery with a running CV-6 Goretex suture, in a end-to-side configuration.  Prior to completing the anastomosis, I allowed the artery to backbleed from both ends.  I then  released the vessel loops on the inflow and allowed the artery to decompress through the graft. There was good pulsatile bleeding through this graft.  I then clamped the graft near its arterial anastomosis.  I then suctioned out all the blood in the graft and instilled heparinized saline into  the graft.   At this point, I pulled the graft to appropriate tension to remove any redundancy.  I then used the bulldog clamps to control the vein.  An anterior wall venotomy was then made with an 11 blade and extended with Potts scissors.  The graft was then cut an beveled to match the venotomy.  The venous anastomosis was created with a CV-6  suture. Prior to completing this anastomosis, I allowed the vein to back bleed and then I also allowed the artery to bleed in an antegrade fashion.  I completed this anastomosis in the usual fashion, and irrigated out the wound with sterile saline.  I released all clamps.  There was excellent flow in the graft, and a palpable pulse in the artery beyond the graft. At this point, I washed out the antecubital wound.  I placed Surgicel and Evicel topical hemostatic agents and hemostasis was complete. The subcutaneous tissue was reapproximated in all incisions with a running stitch of 3-0 Vicryl.  The skin was then reapproximated in all incisions with a running subcuticular 4-0 Monocryl.  The skin was then cleaned and dried at all incisions, and Dermabond used to reinforce the skin closure.  The patient was taken to the recovery room in stable condition having tolerated the procedure well.  COMPLICATIONS: none  CONDITION: stable  Leotis Pain  08/06/2018, 3:03 PM                  This note was created with Dragon Medical transcription system. Any errors in dictation are purely unintentional.

## 2018-08-06 NOTE — H&P (Signed)
[facility] History & Physical Update  The patient was interviewed and re-examined.  The patient's previous History and Physical has been reviewed and is unchanged.  There is no change in the plan of care. We plan to proceed with the scheduled procedure.  Leotis Pain, MD  08/06/2018, 12:33 PM

## 2018-08-06 NOTE — Anesthesia Procedure Notes (Signed)
Procedure Name: Intubation Date/Time: 08/06/2018 1:20 PM Performed by: Lorie Apley, CRNA Pre-anesthesia Checklist: Patient identified, Patient being monitored, Timeout performed, Emergency Drugs available and Suction available Patient Re-evaluated:Patient Re-evaluated prior to induction Oxygen Delivery Method: Circle system utilized Preoxygenation: Pre-oxygenation with 100% oxygen Induction Type: IV induction Ventilation: Mask ventilation without difficulty Laryngoscope Size: Mac and 3 Grade View: Grade I Tube type: Oral Tube size: 7.0 mm Number of attempts: 1 Airway Equipment and Method: Stylet Placement Confirmation: ETT inserted through vocal cords under direct vision,  positive ETCO2 and breath sounds checked- equal and bilateral Secured at: 21 cm Tube secured with: Tape Dental Injury: Teeth and Oropharynx as per pre-operative assessment

## 2018-08-07 ENCOUNTER — Encounter: Payer: Self-pay | Admitting: Vascular Surgery

## 2018-08-07 NOTE — Anesthesia Postprocedure Evaluation (Signed)
Anesthesia Post Note  Patient: Sarah Hamilton  Procedure(s) Performed: INSERTION OF ARTERIOVENOUS (AV) GORE-TEX GRAFT ARM (Left )  Patient location during evaluation: PACU Anesthesia Type: General Level of consciousness: awake and alert Pain management: pain level controlled Vital Signs Assessment: post-procedure vital signs reviewed and stable Respiratory status: spontaneous breathing, nonlabored ventilation and respiratory function stable Cardiovascular status: blood pressure returned to baseline and stable Postop Assessment: no apparent nausea or vomiting Anesthetic complications: no     Last Vitals:  Vitals:   08/06/18 1645 08/06/18 1651  BP: 124/70   Pulse: 93 95  Resp: 16 16  Temp:  (!) 36.1 C  SpO2: 92% 96%    Last Pain:  Vitals:   08/06/18 1651  TempSrc: Temporal  PainSc: [place]

## 2018-08-27 ENCOUNTER — Ambulatory Visit (INDEPENDENT_AMBULATORY_CARE_PROVIDER_SITE_OTHER): Payer: Medicare (Managed Care) | Admitting: Nurse Practitioner

## 2018-08-27 ENCOUNTER — Encounter (INDEPENDENT_AMBULATORY_CARE_PROVIDER_SITE_OTHER): Payer: Self-pay | Admitting: Nurse Practitioner

## 2018-08-27 ENCOUNTER — Telehealth (INDEPENDENT_AMBULATORY_CARE_PROVIDER_SITE_OTHER): Payer: Self-pay

## 2018-08-27 VITALS — BP 122/76 | HR 88 | Resp 18 | Ht 64.0 in | Wt 275.0 lb

## 2018-08-27 DIAGNOSIS — Z992 Dependence on renal dialysis: Secondary | ICD-10-CM | POA: Diagnosis not present

## 2018-08-27 DIAGNOSIS — N186 End stage renal disease: Secondary | ICD-10-CM | POA: Diagnosis not present

## 2018-08-27 DIAGNOSIS — I120 Hypertensive chronic kidney disease with stage 5 chronic kidney disease or end stage renal disease: Secondary | ICD-10-CM

## 2018-08-27 DIAGNOSIS — E785 Hyperlipidemia, unspecified: Secondary | ICD-10-CM | POA: Diagnosis not present

## 2018-08-27 DIAGNOSIS — Z87891 Personal history of nicotine dependence: Secondary | ICD-10-CM

## 2018-08-27 DIAGNOSIS — I10 Essential (primary) hypertension: Secondary | ICD-10-CM

## 2018-08-27 NOTE — Progress Notes (Signed)
Subjective:    Patient ID: Sarah Hamilton, female    DOB: [birth year], 72 y.o.   MRN: [id number] Chief Complaint  Patient presents with  . Follow-up    [facility] 3 week f/u    HPI  Sarah Hamilton is a 72 y.o. female sent underwent placement of a left brachial artery to brachial vein forearm loop graft on 08/06/2018.  The patient denies any pain or swelling following the procedure.  The wound is well-healed well approximated.  However, the patient states that the dialysis center is unable to feel or hear her graft.  The patient is currently maintained on dialysis via right PermCath.  The patient also had previous placement of a graft in the right upper extremity however it previously failed.  Patient denies any fever, chills, nausea, vomiting.  The patient denies any swelling of the upper extremity or steal syndrome symptoms.  The patient denies any chest pain or shortness of breath.   Past Medical History:  Diagnosis Date  . Anemia   . Anginal pain ([place])   . Arthritis   . CHF (congestive heart failure) ([place])   . CKD (chronic kidney disease), stage IV ([place])   . Coronary artery disease   . Dementia ([place])   . Depression   . Diverticulitis   . Diverticulosis   . Dysphagia   . Dyspnea   . Edema    FEET/LEGS  . GERD (gastroesophageal reflux disease)   . Gout   . Heart murmur   . Hepatitis    B  . Hyperlipidemia   . Hyperparathyroidism ([place])   . Hypertension   . Hypothyroidism   . Orthopnea   . Pain    CHRONIC PAIN SYNDROME  . Palpitations   . Restless leg syndrome   . Seizures ([place])    last seizure end of November 2018  . Spinal stenosis   . Tremor    ESSENTIAL  . Wheezing     Past Surgical History:  Procedure Laterality Date  . ABDOMINAL HYSTERECTOMY    . AV FISTULA PLACEMENT Right 10/16/2017   Procedure: ARTERIOVENOUS (AV) FISTULA CREATION (BRACHIOCEPHALIC);  Surgeon: Algernon Huxley, MD;  Location: [facility] ORS;  Service: Vascular;  Laterality: Right;  . AV FISTULA  PLACEMENT Left 08/06/2018   Procedure: INSERTION OF ARTERIOVENOUS (AV) GORE-TEX GRAFT ARM;  Surgeon: Algernon Huxley, MD;  Location: [facility];  Service: Vascular;  Laterality: Left;  . BASCILIC VEIN TRANSPOSITION Right 03/12/2018   Procedure: BASCILIC VEIN TRANSPOSITION;  Surgeon: Algernon Huxley, MD;  Location: [facility];  Service: Vascular;  Laterality: Right;  . CATARACT EXTRACTION W/PHACO Left 11/07/2016   Procedure: CATARACT EXTRACTION PHACO AND INTRAOCULAR LENS PLACEMENT (IOC);  Surgeon: Eulogio Bear, MD;  Location: [facility];  Service: Ophthalmology;  Laterality: Left;  Lot # [id number] H [place]: 00:38.0 AP%: 10.1 CDE: 3.86  . CATARACT EXTRACTION W/PHACO Right 01/09/2017   Procedure: CATARACT EXTRACTION PHACO AND INTRAOCULAR LENS PLACEMENT (IOC);  Surgeon: Eulogio Bear, MD;  Location: [facility];  Service: Ophthalmology;  Laterality: Right;  [place] 54.8 AP% 5.4 CDE 2.96 Fluid pack lot # [id number]  . CHEST SURGERY     BENIGN TUMOR  . CHOLECYSTECTOMY    . CORONARY ANGIOPLASTY     STENT  . DIALYSIS/PERMA CATHETER INSERTION N/A 11/11/2017   Procedure: DIALYSIS/PERMA CATHETER INSERTION;  Surgeon: Katha Cabal, MD;  Location: [facility];  Service: Cardiovascular;  Laterality: N/A;  . DIALYSIS/PERMA CATHETER INSERTION N/A 11/17/2017   Procedure:  DIALYSIS/PERMA CATHETER INSERTION;  Surgeon: Algernon Huxley, MD;  Location: [facility];  Service: Cardiovascular;  Laterality: N/A;  . EYE SURGERY    . JOINT REPLACEMENT Bilateral    TKR  . SHOULDER SURGERY Left    shoulder replacement  . ULTRASOUND GUIDANCE FOR VASCULAR ACCESS  10/16/2017   Procedure: ULTRASOUND GUIDANCE FOR MICROPUNCTURE VENOUS ACCESS;  Surgeon: Algernon Huxley, MD;  Location: [facility];  Service: Vascular;;    Social History   Socioeconomic History  . Marital status: Married    Spouse name: Not on file  . Number of children: Not on file  . Years of education: Not on file  . Highest education level: Not on file    Occupational History  . Not on file  Social Needs  . Financial resource strain: Not on file  . Food insecurity:    Worry: Not on file    Inability: Not on file  . Transportation needs:    Medical: Not on file    Non-medical: Not on file  Tobacco Use  . Smoking status: Former Research scientist (life sciences)  . Smokeless tobacco: Never Used  Substance and Sexual Activity  . Alcohol use: No    Alcohol/week: 0.0 standard drinks  . Drug use: No  . Sexual activity: Not Currently  Lifestyle  . Physical activity:    Days per week: Not on file    Minutes per session: Not on file  . Stress: Not on file  Relationships  . Social connections:    Talks on phone: Not on file    Gets together: Not on file    Attends religious service: Not on file    Active member of club or organization: Not on file    Attends meetings of clubs or organizations: Not on file    Relationship status: Not on file  . Intimate partner violence:    Fear of current or ex partner: Not on file    Emotionally abused: Not on file    Physically abused: Not on file    Forced sexual activity: Not on file  Other Topics Concern  . Not on file  Social History Narrative   Lives at home with her son. Ambulates with a Rollator    Family History  Problem Relation Age of Onset  . CAD Mother   . Hypertension Mother   . Stroke Mother   . Kidney failure Mother   . Cancer Father   . Kidney failure Brother   . Kidney failure Son        on hemodialysis in [place]    Allergies  Allergen Reactions  . Pregabalin Other (See Comments)    Sleepy  . Azithromycin Other (See Comments)    Unknown  . Cymbalta [Duloxetine Hcl] Other (See Comments)    Per MAR  . Erythromycin Hives     Review of Systems   Review of Systems: Negative Unless Checked Constitutional: [] Weight loss  [] Fever  [] Chills Cardiac: [] Chest pain   []  Atrial Fibrillation  [] Palpitations   [] Shortness of breath when laying flat   [] Shortness of breath with  exertion. Vascular:  [] Pain in legs with walking   [] Pain in legs with standing  [] History of DVT   [] Phlebitis   [] Swelling in legs   [] Varicose veins   [] Non-healing ulcers Pulmonary:   [] Uses home oxygen   [] Productive cough   [] Hemoptysis   [] Wheeze  [] COPD   [] Asthma Neurologic:  [] Dizziness   [] Seizures   [] History of  stroke   [] History of TIA  [] Aphasia   [] Vissual changes   [] Weakness or numbness in arm   [x] Weakness or numbness in leg Musculoskeletal:   [] Joint swelling   [] Joint pain   [] Low back pain  []  History of Knee Replacement Hematologic:  [] Easy bruising  [] Easy bleeding   [] Hypercoagulable state   [x] Anemic Gastrointestinal:  [] Diarrhea   [] Vomiting  [] Gastroesophageal reflux/heartburn   [] Difficulty swallowing. Genitourinary:  [] Chronic kidney disease   [] Difficult urination  [] Anuric   [] Blood in urine Skin:  [] Rashes   [] Ulcers  Psychological:  [] History of anxiety   []  History of major depression  []  Memory Difficulties     Objective:   Physical Exam  BP 122/76 (BP Location: Right Arm, Patient Position: Sitting)   Pulse 88   Resp 18   Ht 5\' 4"  (1.626 m)   Wt 275 lb (124.7 kg)   BMI 47.20 kg/m   Gen: WD/WN, NAD Head: [place]/AT, No temporalis wasting.  Ear/Nose/Throat: Hearing grossly intact, nares w/o erythema or drainage Eyes: PER, EOMI, sclera nonicteric.  Neck: Supple, no masses.  No JVD.  Pulmonary:  Good air movement, no use of accessory muscles.  Cardiac: RRR Vascular: No thrill or bruit auscultated  Vessel Right Left  Radial Palpable Palpable   Gastrointestinal: soft, non-distended. No guarding/no peritoneal signs.  Musculoskeletal:Wheelchair bound.  No deformity or atrophy.  Neurologic: Pain and light touch intact in extremities.  Symmetrical.  Speech is fluent. Motor exam as listed above. Psychiatric: Judgment intact, Mood & affect appropriate for pt's clinical situation. Dermatologic: No Venous rashes. No Ulcers Noted.  No changes consistent with  cellulitis. Lymph : No Cervical lymphadenopathy, no lichenification or skin changes of chronic lymphedema.      Assessment & Plan:   1. ESRD on dialysis [facility])   Recommend:  The patient is experiencing increasing problems with their dialysis access.  Patient should have a left upper extremity shuntogram of her brachial-brachial graft with the intention for intervention.  The intention for intervention is to restore appropriate flow and prevent thrombosis and possible loss of the access.  As well as improve the quality of dialysis therapy.  The risks, benefits and alternative therapies were reviewed in detail with the patient.  All questions were answered.  The patient agrees to proceed with angio/intervention.      2. Hyperlipidemia, unspecified hyperlipidemia type Continue statin as ordered and reviewed, no changes at this time   3. Essential hypertension Continue antihypertensive medications as already ordered, these medications have been reviewed and there are no changes at this time.    Current Outpatient Medications on File Prior to Visit  Medication Sig Dispense Refill  . amLODipine (NORVASC) 5 MG tablet Take 5 mg by mouth at bedtime.     Marland Kitchen aspirin EC 81 MG tablet Take 81 mg by mouth daily.    Marland Kitchen atorvastatin (LIPITOR) 20 MG tablet Take 20 mg by mouth at bedtime.    . Baclofen 5 MG TABS Take 5 mg by mouth 2 (two) times daily as needed (spasms).    . camphor-menthol (SARNA) lotion Apply 1 application topically See admin instructions. Apply to dry skin areas twice daily (scheduled) & Applied to right shoulder as needed for itching    . carvedilol (COREG) 3.125 MG tablet Take 1 tablet (3.125 mg total) by mouth 2 (two) times daily with a meal. 30 tablet 0  . Chloroxylenol-Zinc Oxide (BAZA EX) Place 1 application rectally 5 (five) times daily as needed (for skin  breakdown).    Marland Kitchen divalproex (DEPAKOTE) 500 MG DR tablet Take by mouth.    . febuxostat (ULORIC) 40 MG tablet Take 40  mg by mouth daily.    Marland Kitchen HYDROcodone-acetaminophen (NORCO) 5-325 MG tablet Take 1 tablet by mouth every 6 (six) hours as needed for moderate pain. 30 tablet 0  . ipratropium-albuterol (DUONEB) 0.5-2.5 (3) MG/3ML SOLN Take 3 mLs by nebulization every 4 (four) hours as needed. 360 mL 0  . isosorbide mononitrate (IMDUR) 60 MG 24 hr tablet Take 1 tablet (60 mg total) by mouth every evening. 30 tablet 0  . lactulose (CHRONULAC) 10 GM/15ML solution Take 45 mLs (30 g total) by mouth 2 (two) times daily. 240 mL 0  . levETIRAcetam (KEPPRA) 500 MG tablet Take by mouth.    . lidocaine (LIDODERM) 5 % Place 1 patch onto the skin daily as needed (for pain in lower back as needed). Remove & Discard patch within 12 hours or as directed by MD    . lidocaine-prilocaine (EMLA) cream Apply 1 application topically 3 (three) times daily as needed (for heel pain).    . naloxone (NARCAN) nasal spray 4 mg/0.1 mL Place 1 spray into the nose See admin instructions. Administer a single spray of NARCAN in one nostril. Repeat every 3 minutes as needed if no response.    . nitroGLYCERIN (NITROSTAT) 0.4 MG SL tablet Place 0.4 mg under the tongue every 5 (five) minutes x 3 doses as needed for chest pain.     . Nutritional Supplements (FEEDING SUPPLEMENT, NEPRO CARB STEADY,) LIQD Take 237 mLs by mouth 2 (two) times daily between meals. 60 Can 0  . Nutritional Supplements (PROMOD) LIQD Take 60 mLs by mouth daily.    Marland Kitchen oxyCODONE (OXY IR/ROXICODONE) 5 MG immediate release tablet Take 1-2 tablets (5-10 mg total) by mouth every 6 (six) hours as needed for moderate pain or severe pain. 30 tablet 0  . oxyCODONE-acetaminophen (ENDOCET) 5-325 MG tablet Take 1-2 tablets by mouth every 6 (six) hours as needed for severe pain. 30 tablet 0  . pantoprazole (PROTONIX) 40 MG tablet Take 1 tablet (40 mg total) by mouth daily. 30 tablet 0  . promethazine (PHENERGAN) 12.5 MG tablet Take 12.5 mg by mouth every 6 (six) hours as needed for nausea or  vomiting.     Marland Kitchen QUEtiapine (SEROQUEL) 25 MG tablet Take 25 mg by mouth at bedtime.    Marland Kitchen rOPINIRole (REQUIP) 0.25 MG tablet Take 0.25 mg by mouth at bedtime.    . senna-docusate (SENOKOT-S) 8.6-50 MG tablet Take 2 tablets by mouth at bedtime.     . sevelamer carbonate (RENVELA) 2.4 g PACK Take 2.4 g by mouth 3 (three) times daily with meals.    . torsemide (DEMADEX) 100 MG tablet Take 100 mg by mouth daily.    Marland Kitchen valproic acid (DEPAKENE) 250 MG capsule Take 2 capsules (500 mg total) by mouth 3 (three) times daily. (Patient taking differently: Take 500 mg by mouth 3 (three) times daily. Morning, 1400, & at bedtime) 30 capsule 0  . Zinc Oxide (DESITIN) 13 % CREA Apply 1 application topically 2 (two) times daily. Apply to proximal, medial, inner thigh on left thigh twice daily.    Marland Kitchen acetaminophen (TYLENOL) 650 MG CR tablet Take 650 mg by mouth 3 (three) times daily.     No current facility-administered medications on file prior to visit.     There are no Patient Instructions on file for this visit. No follow-ups on file.  Kris Hartmann, NP  This note was completed with Sales executive.  Any errors are purely unintentional.

## 2018-08-27 NOTE — Telephone Encounter (Signed)
Dr. Corky Sing called and stated that she has questions about a shuntogram. She wants to know what the procedure entails and if her patient will need one. She also stated that the patient had been recently hospitalized and that she wanted to share her thoughts on that.  She would like a call back at your earliest convenience.  [phone] office

## 2018-08-31 ENCOUNTER — Telehealth (INDEPENDENT_AMBULATORY_CARE_PROVIDER_SITE_OTHER): Payer: Self-pay

## 2018-08-31 NOTE — Telephone Encounter (Signed)
Spoke with Ebony at [facility] on Thursday to get the patient scheduled for a shuntogram and was told that the patient had to have an order from the physician in the facility. I called again today to schedule the patient and was told by Charlena Cross that the physician knew about the procedure but wanted her to wait to schedule.

## 2018-08-31 NOTE — Telephone Encounter (Signed)
Spoke with Dr. Corky Sing regarding shuntogram procedure on 08/28/2018.  Informed her that Ms. Vecchiarelli currently had no flow in her graft and that this procedure would be necessary to save the graft.  This procedure would need to take place sooner rather than later.  If graft is unable to be salvaged the patient would need a new fistula placed.  Also spoke with provider about perm cath placement and how continued use is less than ideal. This was understood.

## 2018-09-01 ENCOUNTER — Encounter (INDEPENDENT_AMBULATORY_CARE_PROVIDER_SITE_OTHER): Payer: Self-pay

## 2018-09-09 ENCOUNTER — Other Ambulatory Visit (INDEPENDENT_AMBULATORY_CARE_PROVIDER_SITE_OTHER): Payer: Self-pay | Admitting: Vascular Surgery

## 2018-09-09 MED ORDER — CEFAZOLIN SODIUM-DEXTROSE 1-4 GM/50ML-% IV SOLN
1.0000 g | Freq: Once | INTRAVENOUS | Status: AC
  Administered 2018-09-10: 1 g via INTRAVENOUS

## 2018-09-10 ENCOUNTER — Encounter: Payer: Self-pay | Admitting: Vascular Surgery

## 2018-09-10 ENCOUNTER — Encounter
Admission: RE | Disposition: A | Discharge status: Skilled Nursing Facility | Payer: Self-pay | Source: Ambulatory Visit | Attending: Vascular Surgery

## 2018-09-10 ENCOUNTER — Other Ambulatory Visit: Payer: Self-pay

## 2018-09-10 ENCOUNTER — Ambulatory Visit
Admission: RE | Admit: 2018-09-10 | Discharge: 2018-09-10 | Disposition: A | Discharge status: Skilled Nursing Facility | Payer: Medicare (Managed Care) | Source: Ambulatory Visit | Attending: Vascular Surgery | Admitting: Vascular Surgery

## 2018-09-10 DIAGNOSIS — I509 Heart failure, unspecified: Secondary | ICD-10-CM | POA: Insufficient documentation

## 2018-09-10 DIAGNOSIS — Z9049 Acquired absence of other specified parts of digestive tract: Secondary | ICD-10-CM | POA: Insufficient documentation

## 2018-09-10 DIAGNOSIS — G2581 Restless legs syndrome: Secondary | ICD-10-CM | POA: Insufficient documentation

## 2018-09-10 DIAGNOSIS — N186 End stage renal disease: Secondary | ICD-10-CM

## 2018-09-10 DIAGNOSIS — Z9889 Other specified postprocedural states: Secondary | ICD-10-CM | POA: Diagnosis not present

## 2018-09-10 DIAGNOSIS — I742 Embolism and thrombosis of arteries of the upper extremities: Secondary | ICD-10-CM | POA: Diagnosis not present

## 2018-09-10 DIAGNOSIS — Z992 Dependence on renal dialysis: Secondary | ICD-10-CM | POA: Diagnosis not present

## 2018-09-10 DIAGNOSIS — Y832 Surgical operation with anastomosis, bypass or graft as the cause of abnormal reaction of the patient, or of later complication, without mention of misadventure at the time of the procedure: Secondary | ICD-10-CM | POA: Insufficient documentation

## 2018-09-10 DIAGNOSIS — Z87891 Personal history of nicotine dependence: Secondary | ICD-10-CM | POA: Diagnosis not present

## 2018-09-10 DIAGNOSIS — Z955 Presence of coronary angioplasty implant and graft: Secondary | ICD-10-CM | POA: Insufficient documentation

## 2018-09-10 DIAGNOSIS — Z96651 Presence of right artificial knee joint: Secondary | ICD-10-CM | POA: Insufficient documentation

## 2018-09-10 DIAGNOSIS — R569 Unspecified convulsions: Secondary | ICD-10-CM | POA: Insufficient documentation

## 2018-09-10 DIAGNOSIS — Z888 Allergy status to other drugs, medicaments and biological substances status: Secondary | ICD-10-CM | POA: Diagnosis not present

## 2018-09-10 DIAGNOSIS — M1990 Unspecified osteoarthritis, unspecified site: Secondary | ICD-10-CM | POA: Insufficient documentation

## 2018-09-10 DIAGNOSIS — I132 Hypertensive heart and chronic kidney disease with heart failure and with stage 5 chronic kidney disease, or end stage renal disease: Secondary | ICD-10-CM | POA: Diagnosis not present

## 2018-09-10 DIAGNOSIS — Z9841 Cataract extraction status, right eye: Secondary | ICD-10-CM | POA: Diagnosis not present

## 2018-09-10 DIAGNOSIS — E785 Hyperlipidemia, unspecified: Secondary | ICD-10-CM | POA: Diagnosis not present

## 2018-09-10 DIAGNOSIS — B1910 Unspecified viral hepatitis B without hepatic coma: Secondary | ICD-10-CM | POA: Diagnosis not present

## 2018-09-10 DIAGNOSIS — E0590 Thyrotoxicosis, unspecified without thyrotoxic crisis or storm: Secondary | ICD-10-CM | POA: Diagnosis not present

## 2018-09-10 DIAGNOSIS — Z881 Allergy status to other antibiotic agents status: Secondary | ICD-10-CM | POA: Insufficient documentation

## 2018-09-10 DIAGNOSIS — Z9842 Cataract extraction status, left eye: Secondary | ICD-10-CM | POA: Diagnosis not present

## 2018-09-10 DIAGNOSIS — K219 Gastro-esophageal reflux disease without esophagitis: Secondary | ICD-10-CM | POA: Diagnosis not present

## 2018-09-10 DIAGNOSIS — T82868A Thrombosis of vascular prosthetic devices, implants and grafts, initial encounter: Secondary | ICD-10-CM | POA: Diagnosis not present

## 2018-09-10 DIAGNOSIS — I2510 Atherosclerotic heart disease of native coronary artery without angina pectoris: Secondary | ICD-10-CM | POA: Diagnosis not present

## 2018-09-10 DIAGNOSIS — Z96612 Presence of left artificial shoulder joint: Secondary | ICD-10-CM | POA: Insufficient documentation

## 2018-09-10 DIAGNOSIS — Z7982 Long term (current) use of aspirin: Secondary | ICD-10-CM | POA: Insufficient documentation

## 2018-09-10 DIAGNOSIS — Z79899 Other long term (current) drug therapy: Secondary | ICD-10-CM | POA: Diagnosis not present

## 2018-09-10 DIAGNOSIS — M109 Gout, unspecified: Secondary | ICD-10-CM | POA: Insufficient documentation

## 2018-09-10 DIAGNOSIS — Z8249 Family history of ischemic heart disease and other diseases of the circulatory system: Secondary | ICD-10-CM | POA: Insufficient documentation

## 2018-09-10 DIAGNOSIS — Z90710 Acquired absence of both cervix and uterus: Secondary | ICD-10-CM | POA: Diagnosis not present

## 2018-09-10 DIAGNOSIS — Z841 Family history of disorders of kidney and ureter: Secondary | ICD-10-CM | POA: Insufficient documentation

## 2018-09-10 DIAGNOSIS — Z823 Family history of stroke: Secondary | ICD-10-CM | POA: Insufficient documentation

## 2018-09-10 HISTORY — PX: A/V SHUNTOGRAM: CATH118297

## 2018-09-10 LAB — POTASSIUM (ARMC VASCULAR LAB ONLY): Potassium (ARMC vascular lab): 3.7 (ref 3.5–5.1)

## 2018-09-10 SURGERY — A/V SHUNTOGRAM
Anesthesia: Moderate Sedation | Laterality: Left

## 2018-09-10 MED ORDER — ALTEPLASE 2 MG IJ SOLR
INTRAMUSCULAR | Status: AC
  Filled 2018-09-10: qty 4

## 2018-09-10 MED ORDER — HEPARIN SODIUM (PORCINE) 1000 UNIT/ML IJ SOLN
INTRAMUSCULAR | Status: AC
  Filled 2018-09-10: qty 1

## 2018-09-10 MED ORDER — IOPAMIDOL (ISOVUE-300) INJECTION 61%
INTRAVENOUS | Status: DC | PRN
  Administered 2018-09-10: 50 mL via INTRA_ARTERIAL

## 2018-09-10 MED ORDER — LIDOCAINE-EPINEPHRINE (PF) 1 %-1:200000 IJ SOLN
INTRAMUSCULAR | Status: AC
  Filled 2018-09-10: qty 30

## 2018-09-10 MED ORDER — ONDANSETRON HCL 4 MG/2ML IJ SOLN: 4.0000 mg | Freq: Four times a day (QID) | INTRAMUSCULAR | Status: DC | PRN

## 2018-09-10 MED ORDER — DIPHENHYDRAMINE HCL 50 MG/ML IJ SOLN
INTRAMUSCULAR | Status: AC
  Filled 2018-09-10: qty 1

## 2018-09-10 MED ORDER — FENTANYL CITRATE (PF) 100 MCG/2ML IJ SOLN
INTRAMUSCULAR | Status: AC
  Filled 2018-09-10: qty 2

## 2018-09-10 MED ORDER — MIDAZOLAM HCL 5 MG/5ML IJ SOLN
INTRAMUSCULAR | Status: AC
  Filled 2018-09-10: qty 5

## 2018-09-10 MED ORDER — METHYLPREDNISOLONE SODIUM SUCC 125 MG IJ SOLR: 125.0000 mg | INTRAMUSCULAR | Status: DC | PRN

## 2018-09-10 MED ORDER — HYDROMORPHONE HCL 1 MG/ML IJ SOLN: 1.0000 mg | Freq: Once | INTRAMUSCULAR | Status: DC | PRN

## 2018-09-10 MED ORDER — SODIUM CHLORIDE 0.9 % IV SOLN: INTRAVENOUS | Status: DC

## 2018-09-10 MED ORDER — ALTEPLASE 2 MG IJ SOLR
INTRAMUSCULAR | Status: DC | PRN
  Administered 2018-09-10: 4 mg

## 2018-09-10 MED ORDER — DIPHENHYDRAMINE HCL 50 MG/ML IJ SOLN
INTRAMUSCULAR | Status: DC | PRN
  Administered 2018-09-10: 25 mg via INTRAVENOUS

## 2018-09-10 MED ORDER — HEPARIN SODIUM (PORCINE) 1000 UNIT/ML IJ SOLN
INTRAMUSCULAR | Status: DC | PRN
  Administered 2018-09-10: 4000 [IU] via INTRAVENOUS

## 2018-09-10 MED ORDER — HEPARIN (PORCINE) IN NACL 1000-0.9 UT/500ML-% IV SOLN
INTRAVENOUS | Status: AC
  Filled 2018-09-10: qty 1000

## 2018-09-10 MED ORDER — FENTANYL CITRATE (PF) 100 MCG/2ML IJ SOLN
INTRAMUSCULAR | Status: DC | PRN
  Administered 2018-09-10 (×2): 25 ug via INTRAVENOUS
  Administered 2018-09-10 (×3): 50 ug via INTRAVENOUS

## 2018-09-10 MED ORDER — FAMOTIDINE 20 MG PO TABS: 40.0000 mg | ORAL_TABLET | ORAL | Status: DC | PRN

## 2018-09-10 MED ORDER — MIDAZOLAM HCL 2 MG/2ML IJ SOLN
INTRAMUSCULAR | Status: DC | PRN
  Administered 2018-09-10: 1 mg via INTRAVENOUS
  Administered 2018-09-10: 2 mg via INTRAVENOUS
  Administered 2018-09-10 (×2): 1 mg via INTRAVENOUS

## 2018-09-10 MED ORDER — CEFAZOLIN SODIUM-DEXTROSE 1-4 GM/50ML-% IV SOLN
INTRAVENOUS | Status: AC
  Administered 2018-09-10: 1 g via INTRAVENOUS
  Filled 2018-09-10: qty 50

## 2018-09-10 SURGICAL SUPPLY — 23 items
BALLN DORADO 6X80X80 (BALLOONS) ×3
BALLN LUTONIX DCB 6X80X130 (BALLOONS) ×3
BALLN STERLING OTW 3X150X150 (BALLOONS) ×3
BALLOON DORADO 6X80X80 (BALLOONS) ×1 IMPLANT
BALLOON LUTONIX DCB 6X80X130 (BALLOONS) ×1 IMPLANT
BALLOON STERLING OTW 3X150X150 (BALLOONS) ×1 IMPLANT
CANISTER PENUMBRA ENGINE (MISCELLANEOUS) ×3 IMPLANT
CANNULA 5F STIFF (CANNULA) ×3 IMPLANT
CATH BEACON 5 .035 40 KMP TP (CATHETERS) ×1 IMPLANT
CATH BEACON 5 .038 40 KMP TP (CATHETERS) ×2
CATH EMBOLECTOMY 5FR (BALLOONS) ×3 IMPLANT
CATH INDIGO CAT6 KIT (CATHETERS) ×3 IMPLANT
DEVICE PRESTO INFLATION (MISCELLANEOUS) ×3 IMPLANT
DEVICE TORQUE .025-.038 (MISCELLANEOUS) ×3 IMPLANT
DRAPE BRACHIAL (DRAPES) ×3 IMPLANT
GLIDEWIRE STIFF .35X180X3 HYDR (WIRE) ×3 IMPLANT
PACK ANGIOGRAPHY (CUSTOM PROCEDURE TRAY) ×3 IMPLANT
SET AVX THROMB ULT (MISCELLANEOUS) ×3 IMPLANT
SHEATH BRITE TIP 6FRX5.5 (SHEATH) ×6 IMPLANT
STENT VIABAHN 6X150X120 (Permanent Stent) ×3 IMPLANT
SUT MNCRL AB 4-0 PS2 18 (SUTURE) ×3 IMPLANT
WIRE G 018X200 V18 (WIRE) ×3 IMPLANT
WIRE MAGIC TOR.035 180C (WIRE) ×6 IMPLANT

## 2018-09-10 NOTE — Op Note (Signed)
Sarah Hamilton    OPERATIVE NOTE   PROCEDURE: 1.  Left brachial artery to brachial vein forearm loop arteriovenous graft cannulation under ultrasound guidance in both a retrograde and then antegrade fashion crossing 2.  Left arm shuntogram and central venogram 3.  Catheter directed thrombolysis with 4 mg of TPA delivered with the AngioJet AVX catheter 4.  Mechanical rheolytic thrombectomy to the left forearm loop AV graft and left brachial artery and ulnar artery with the AngioJet AVX catheter 5.  Fogarty embolectomy for residual arterial plug 6.  Percutaneous transluminal angioplasty of arterial anastomosis with 6 mm diameter by 8 cm length Lutonix drug-coated angioplasty balloon 7.  Percutaneous transluminal angioplasty of the venous anastomosis with 6 mm diameter by 8 cm length angioplasty balloon 8.  Catheter placement into the ulnar artery on the left to the retrograde sheath and left upper extremity angiogram 9.  Mechanical thrombectomy to the left brachial artery and ulnar artery with the penumbra cat 6 device 10.  Percutaneous transluminal angioplasty of the left ulnar artery with 3 mm diameter by 15 cm length angioplasty balloon 11.  Viabahn stent placement in the distal graft and across the venous anastomosis with 6 mm diameter by 15 cm length covered stent  PRE-OPERATIVE DIAGNOSIS: 1. ESRD 2.  Thrombosed left brachial artery to brachial vein arteriovenous graft 3.  Thrombus in the left brachial artery occluding outflow to the hand creating an ischemic situation  POST-OPERATIVE DIAGNOSIS: same as above   SURGEON: Leotis Pain, MD  ANESTHESIA: local with Moderate Conscious Sedation for approximately 60 minutes using 5 mg of Versed and 200 Mcg of Fentanyl  ESTIMATED BLOOD LOSS: 150 cc  FINDING(S): 1. Thrombosis of the graft.  The venous anastomosis appeared to be highly stenotic to completely occluded with thrombosis.  The arterial plug on our initial imaging  seem to impede flow through the arterial circuit in the upper arm and on further evaluation there was thrombus in the brachial artery and the brachial bifurcation creating arterial malperfusion to the hand.  SPECIMEN(S):  None  CONTRAST: 50 cc  FLUORO TIME: 14.2 minutes  INDICATIONS: Patient is a 72 y.o.female who presents with a thrombosed left brachial artery to brachial vein loop forearm arteriovenous graft.  The patient is scheduled for an attempted declot and shuntogram.  The patient is aware the risks include but are not limited to: bleeding, infection, thrombosis of the cannulated access, and possible anaphylactic reaction to the contrast.  The patient is aware of the risks of the procedure and elects to proceed forward.  DESCRIPTION: After full informed written consent was obtained, the patient was brought back to the angiography suite and placed supine upon the angiography table.  The patient was connected to monitoring equipment. Moderate conscious sedation was administered with a face to face encounter with the patient throughout the procedure with my supervision of the RN administering medicines and monitoring the patient's vital signs, pulse oximetry, telemetry and mental status throughout from the start of the procedure until the patient was taken to the recovery room. The left arm was prepped and draped in the standard fashion for a percutaneous access intervention.  Under ultrasound guidance, the left brachial artery to brachial vein loop forearm arteriovenous graft was cannulated with a micropuncture needle under direct ultrasound guidance due to the pulseless nature of the graft in both an antegrade and a retrograde fashion crossing, and permanent images were performed.  The microwire was advanced and the needle was exchanged for the  a microsheath.  I then upsized to a 6 Fr Sheath and imaging was performed.  Hand injections were completed to image the access including the central venous  system. This demonstrated no flow within the AV graft.  Based on the images, this patient will need extensive treatment to salvage the graft. I then gave the patient thousand units of intravenous heparin.  I then placed a Magic torque wire into the brachial artery from the retrograde sheath and into the brachial vein from the antegrade sheath.  Both sides were quite tedious and required a Kumpe catheter and actually had to use a Glidewire to get into the circuit.  4 mg of TPA were deployed throughout the entirety of the graft and into the heel vein. This was allowed to dwell for 10-15 minutes. Mechanical rheolytic thrombectomy was then performed throughout the graft and into the brachial vein.  This was also taken up into the brachial artery where there was thrombus that appeared to be not only occluding the graft but the distal arterial circulation.  This uncovered near occlusive stenosis at the venous anastomosis.  A residual arterial plug was also seen at the arterial anastomosis. An attempt to clear the arterial plug was done with 4 passes of the Fogarty embolectomy balloon. Flow-limiting arterial plug remained and now on imaging clearly thrombus could be seen distal to the graft and the brachial artery and the bifurcation to the radial and ulnar arteries.  Using a Kumpe catheter and a Glidewire I was able to navigate across the occlusion in the brachial artery and into the ulnar artery with a Kumpe catheter confirming intraluminal flow in the mid ulnar artery I then placed a 0.018 wire.  The AngioJet AVX catheter was used to treat the brachial artery and the ulnar artery with multiple passes made but little improvement seen.  A 3 mm diameter by 15 cm length angioplasty balloon was inflated from the middle of our artery back up to the brachial artery and taken to 14 atm for 1 minute.  Again, minimal improvement was seen.  At this point, I exchanged for the penumbra cat 6 catheter and use this to treat the  brachial artery distal to the graft and the ulnar artery.  Multiple passes were made and we were able to restore flow through the ulnar artery and also able to see flow into the interosseous artery after the penumbra device was used.  At this point, a Kumpe catheter was taken up in the brachial artery proximal to the graft and imaging was performed which did show no obvious flow limiting residual stenosis in the ulnar brachial arteries distal to the graft with improved flow to the hand.  The proximal portion of the graft was also cleared of thrombus and stenosis. The retrograde sheath was removed. I then turned my attention to the thrombus in the distal graft and the keel vein. Mechanical rheolytic thrombectomy was performed with the AVX catheter with minimal improvement. I then elected to treat this with a 6 mm diameter by 8 cm length angioplasty balloon inflated twice in the distal graft and across the venous anastomosis.  Inflations were 12 to 14 atm for 1 minute.  There was thrombus at the site of the retrograde sheath that was partially occlusive and near occlusive stenosis with thrombus at the venous anastomosis.  I then exchanged for a 0.018 wire and placed a 6 mm diameter by 15 cm length Viabahn covered stent into the brachial vein across the venous anastomosis  in the distal portion of the graft to encompass the thrombus that was near the retrograde access sheath as well.  This was postdilated with 6 mm diameter high-pressure balloon with less than 20% residual stenosis and brisk flow seen on angiography.    Based on the completion imaging, no further intervention is necessary.  The wire and balloon were removed from the sheath.  A 4-0 Monocryl purse-string suture was sewn around the sheath.  The sheath was removed while tying down the suture.  A sterile bandage was applied to the puncture site.  COMPLICATIONS: None  CONDITION: Stable   Leotis Pain 09/10/2018 12:23 PM   This note was created  with Dragon Medical transcription system. Any errors in dictation are purely unintentional.

## 2018-09-10 NOTE — H&P (Signed)
[facility] History & Physical Update  The patient was interviewed and re-examined.  The patient's previous History and Physical has been reviewed and is unchanged.  There is no change in the plan of care. We plan to proceed with the scheduled procedure.  Leotis Pain, MD  09/10/2018, 8:53 AM

## 2018-10-15 ENCOUNTER — Encounter (INDEPENDENT_AMBULATORY_CARE_PROVIDER_SITE_OTHER): Payer: Self-pay | Admitting: Nurse Practitioner

## 2018-10-15 ENCOUNTER — Ambulatory Visit (INDEPENDENT_AMBULATORY_CARE_PROVIDER_SITE_OTHER): Payer: Medicare (Managed Care) | Admitting: Nurse Practitioner

## 2018-10-15 ENCOUNTER — Other Ambulatory Visit: Payer: Self-pay

## 2018-10-15 VITALS — BP 176/94 | HR 93 | Resp 16 | Ht 62.0 in | Wt 267.0 lb

## 2018-10-15 DIAGNOSIS — Z992 Dependence on renal dialysis: Secondary | ICD-10-CM

## 2018-10-15 DIAGNOSIS — K219 Gastro-esophageal reflux disease without esophagitis: Secondary | ICD-10-CM

## 2018-10-15 DIAGNOSIS — I10 Essential (primary) hypertension: Secondary | ICD-10-CM

## 2018-10-15 DIAGNOSIS — E785 Hyperlipidemia, unspecified: Secondary | ICD-10-CM

## 2018-10-15 DIAGNOSIS — N186 End stage renal disease: Secondary | ICD-10-CM

## 2018-10-22 ENCOUNTER — Encounter (INDEPENDENT_AMBULATORY_CARE_PROVIDER_SITE_OTHER): Payer: Self-pay | Admitting: Nurse Practitioner

## 2018-10-22 NOTE — Progress Notes (Signed)
Subjective:    Patient ID: Sarah Hamilton, female    DOB: 03/11/1946, 72 y.o.   MRN: [id number] Chief Complaint  Patient presents with  . Follow-up    post op shuntogram    HPI  Sarah Hamilton is a 72 y.o. female that presents today for a 4-week follow-up after fistulogram.  The patient had extensive thrombectomy and percutaneous transluminal angioplasty done to her left upper extremity loop graft.  Today, the patient has a palpable thrill and bruit whereas on the last visit she did not.  The patient denies any issues currently with dialysis, except for due to the operator.  She states that depending on who the person is accessing her site causes her more pain than others.  She denies any issues with hold times, or extend hold times.  She denies any fever, chills, nausea, vomiting or diarrhea while on dialysis.  She denies any steal -like hand pain.    Past Medical History:  Diagnosis Date  . Anemia   . Anginal pain ([place])   . Arthritis   . CHF (congestive heart failure) ([place])   . CKD (chronic kidney disease), stage IV ([place])   . Coronary artery disease   . Dementia ([place])   . Depression   . Diverticulitis   . Diverticulosis   . Dysphagia   . Dyspnea   . Edema    FEET/LEGS  . GERD (gastroesophageal reflux disease)   . Gout   . Heart murmur   . Hepatitis    B  . Hyperlipidemia   . Hyperparathyroidism ([place])   . Hypertension   . Hypothyroidism   . Orthopnea   . Pain    CHRONIC PAIN SYNDROME  . Palpitations   . Restless leg syndrome   . Seizures ([place])    last seizure end of November 2018  . Spinal stenosis   . Tremor    ESSENTIAL  . Wheezing     Past Surgical History:  Procedure Laterality Date  . A/V SHUNTOGRAM Left 09/10/2018   Procedure: A/V SHUNTOGRAM;  Surgeon: Algernon Huxley, MD;  Location: [facility];  Service: Cardiovascular;  Laterality: Left;  . ABDOMINAL HYSTERECTOMY    . AV FISTULA PLACEMENT Right 10/16/2017   Procedure: ARTERIOVENOUS  (AV) FISTULA CREATION (BRACHIOCEPHALIC);  Surgeon: Algernon Huxley, MD;  Location: [facility];  Service: Vascular;  Laterality: Right;  . AV FISTULA PLACEMENT Left 08/06/2018   Procedure: INSERTION OF ARTERIOVENOUS (AV) GORE-TEX GRAFT ARM;  Surgeon: Algernon Huxley, MD;  Location: [facility];  Service: Vascular;  Laterality: Left;  . BASCILIC VEIN TRANSPOSITION Right 03/12/2018   Procedure: BASCILIC VEIN TRANSPOSITION;  Surgeon: Algernon Huxley, MD;  Location: [facility];  Service: Vascular;  Laterality: Right;  . CATARACT EXTRACTION W/PHACO Left 11/07/2016   Procedure: CATARACT EXTRACTION PHACO AND INTRAOCULAR LENS PLACEMENT (IOC);  Surgeon: Eulogio Bear, MD;  Location: [facility];  Service: Ophthalmology;  Laterality: Left;  Lot # [id number] H [place]: 00:38.0 AP%: 10.1 CDE: 3.86  . CATARACT EXTRACTION W/PHACO Right 01/09/2017   Procedure: CATARACT EXTRACTION PHACO AND INTRAOCULAR LENS PLACEMENT (IOC);  Surgeon: Eulogio Bear, MD;  Location: [facility];  Service: Ophthalmology;  Laterality: Right;  [place] 54.8 AP% 5.4 CDE 2.96 Fluid pack lot # [id number]  . CHEST SURGERY     BENIGN TUMOR  . CHOLECYSTECTOMY    . CORONARY ANGIOPLASTY     STENT  . DIALYSIS/PERMA CATHETER INSERTION N/A 11/11/2017   Procedure: DIALYSIS/PERMA CATHETER INSERTION;  Surgeon: Katha Cabal, MD;  Location: [facility];  Service: Cardiovascular;  Laterality: N/A;  . DIALYSIS/PERMA CATHETER INSERTION N/A 11/17/2017   Procedure: DIALYSIS/PERMA CATHETER INSERTION;  Surgeon: Algernon Huxley, MD;  Location: [place] [facility];  Service: Cardiovascular;  Laterality: N/A;  . EYE SURGERY    . JOINT REPLACEMENT Bilateral    TKR  . SHOULDER SURGERY Left    shoulder replacement  . ULTRASOUND GUIDANCE FOR VASCULAR ACCESS  10/16/2017   Procedure: ULTRASOUND GUIDANCE FOR MICROPUNCTURE VENOUS ACCESS;  Surgeon: Algernon Huxley, MD;  Location: [facility];  Service: Vascular;;    Social History   Socioeconomic History  . Marital status: Married      Spouse name: Not on file  . Number of children: Not on file  . Years of education: Not on file  . Highest education level: Not on file  Occupational History  . Not on file  Social Needs  . Financial resource strain: Not on file  . Food insecurity:    Worry: Not on file    Inability: Not on file  . Transportation needs:    Medical: Not on file    Non-medical: Not on file  Tobacco Use  . Smoking status: Former Research scientist (life sciences)  . Smokeless tobacco: Never Used  Substance and Sexual Activity  . Alcohol use: No    Alcohol/week: 0.0 standard drinks  . Drug use: No  . Sexual activity: Not Currently  Lifestyle  . Physical activity:    Days per week: Not on file    Minutes per session: Not on file  . Stress: Not on file  Relationships  . Social connections:    Talks on phone: Not on file    Gets together: Not on file    Attends religious service: Not on file    Active member of club or organization: Not on file    Attends meetings of clubs or organizations: Not on file    Relationship status: Not on file  . Intimate partner violence:    Fear of current or ex partner: Not on file    Emotionally abused: Not on file    Physically abused: Not on file    Forced sexual activity: Not on file  Other Topics Concern  . Not on file  Social History Narrative   Lives at home with her son. Ambulates with a Rollator    Family History  Problem Relation Age of Onset  . CAD Mother   . Hypertension Mother   . Stroke Mother   . Kidney failure Mother   . Cancer Father   . Kidney failure Brother   . Kidney failure Son        on hemodialysis in [place]    Allergies  Allergen Reactions  . Pregabalin Other (See Comments)    Sleepy  . Azithromycin Other (See Comments)    Unknown  . Cymbalta [Duloxetine Hcl] Other (See Comments)    Per MAR  . Erythromycin Hives     Review of Systems   Review of Systems: Negative Unless Checked Constitutional: [] Weight loss  [] Fever  [] Chills Cardiac:  [] Chest pain   []  Atrial Fibrillation  [] Palpitations   [] Shortness of breath when laying flat   [] Shortness of breath with exertion. [] Shortness of breath at rest Vascular:  [] Pain in legs with walking   [] Pain in legs with standing [] Pain in legs when laying flat   [] Claudication    [] Pain in feet when laying flat    []   History of DVT   [] Phlebitis   [] Swelling in legs   [] Varicose veins   [] Non-healing ulcers Pulmonary:   [] Uses home oxygen   [] Productive cough   [] Hemoptysis   [] Wheeze  [] COPD   [] Asthma Neurologic:  [] Dizziness   [] Seizures  [] Blackouts [] History of stroke   [] History of TIA  [] Aphasia   [] Temporary Blindness   [] Weakness or numbness in arm   [x] Weakness or numbness in leg Musculoskeletal:   [] Joint swelling   [] Joint pain   [] Low back pain  []  History of Knee Replacement [] Arthritis [] back Surgeries  []  Spinal Stenosis    Hematologic:  [] Easy bruising  [] Easy bleeding   [] Hypercoagulable state   [x] Anemic Gastrointestinal:  [] Diarrhea   [] Vomiting  [x] Gastroesophageal reflux/heartburn   [] Difficulty swallowing. [] Abdominal pain Genitourinary:  [x] Chronic kidney disease   [] Difficult urination  [] Anuric   [] Blood in urine [] Frequent urination  [] Burning with urination   [] Hematuria Skin:  [] Rashes   [] Ulcers [] Wounds Psychological:  [] History of anxiety   []  History of major depression  []  Memory Difficulties     Objective:   Physical Exam  BP (!) 176/94   Pulse 93   Resp 16   Ht 5\' 2"  (1.575 m)   Wt 267 lb (121.1 kg)   BMI 48.83 kg/m   Gen: WD/WN, NAD Head: [place]/AT, No temporalis wasting.  Ear/Nose/Throat: Hearing grossly intact, nares w/o erythema or drainage Eyes: PER, EOMI, sclera nonicteric.  Neck: Supple, no masses.  No JVD.  Pulmonary:  Good air movement, no use of accessory muscles.  Cardiac: RRR Vascular:  Good thrill and bruit felt Vessel Right Left  Radial Palpable Palpable   Gastrointestinal: soft, non-distended. No guarding/no peritoneal signs.    Musculoskeletal: Patient uses walker for ambulation.  No deformity or atrophy.  Neurologic: Pain and light touch intact in extremities.  Symmetrical.  Speech is fluent. Motor exam as listed above. Psychiatric: Judgment intact, Mood & affect appropriate for pt's clinical situation. Dermatologic: No Venous rashes. No Ulcers Noted.  No changes consistent with cellulitis. Lymph : No Cervical lymphadenopathy, no lichenification or skin changes of chronic lymphedema.      Assessment & Plan:   1. Hyperlipidemia, unspecified hyperlipidemia type Continue statin as ordered and reviewed, no changes at this time   2. ESRD on dialysis [facility]) The patient did not have a hemodialysis access duplex done today.  We will obtain another for a baseline measurement due to the fact that her previous HDA had no flow.  Otherwise, her fistula seems to be working fine during dialysis therefore we will continue to allow her to use it.  She will follow-up in the office with me following her HDA. - VAS US DUPLEX DIALYSIS ACCESS (AVF, AVG); Future  3. Gastroesophageal reflux disease, esophagitis presence not specified Continue PPI as already ordered, this medication has been reviewed and there are no changes at this time.  Avoidence of caffeine and alcohol  Moderate elevation of the head of the bed   4. Essential hypertension Continue antihypertensive medications as already ordered, these medications have been reviewed and there are no changes at this time.    Current Outpatient Medications on File Prior to Visit  Medication Sig Dispense Refill  . acetaminophen (TYLENOL) 650 MG CR tablet Take 650 mg by mouth 3 (three) times daily.    Marland Kitchen amLODipine (NORVASC) 5 MG tablet Take 5 mg by mouth at bedtime.     Marland Kitchen atorvastatin (LIPITOR) 20 MG tablet Take 20 mg by mouth  at bedtime.    . Baclofen 5 MG TABS Take 5 mg by mouth 2 (two) times daily as needed (spasms).    . camphor-menthol (SARNA) lotion Apply 1 application  topically See admin instructions. Apply to dry skin areas twice daily (scheduled) & Applied to right shoulder as needed for itching    . carvedilol (COREG) 3.125 MG tablet Take 1 tablet (3.125 mg total) by mouth 2 (two) times daily with a meal. 30 tablet 0  . Chloroxylenol-Zinc Oxide (BAZA EX) Place 1 application rectally 5 (five) times daily as needed (for skin breakdown).    Marland Kitchen divalproex (DEPAKOTE) 500 MG DR tablet Take by mouth.    . febuxostat (ULORIC) 40 MG tablet Take 40 mg by mouth daily.    . isosorbide mononitrate (IMDUR) 60 MG 24 hr tablet Take 1 tablet (60 mg total) by mouth every evening. 30 tablet 0  . levETIRAcetam (KEPPRA) 500 MG tablet Take by mouth.    . lidocaine-prilocaine (EMLA) cream Apply 1 application topically 3 (three) times daily as needed (for heel pain).    . naloxone (NARCAN) nasal spray 4 mg/0.1 mL Place 1 spray into the nose See admin instructions. Administer a single spray of NARCAN in one nostril. Repeat every 3 minutes as needed if no response.    . nitroGLYCERIN (NITROSTAT) 0.4 MG SL tablet Place 0.4 mg under the tongue every 5 (five) minutes x 3 doses as needed for chest pain.     . Nutritional Supplements (FEEDING SUPPLEMENT, NEPRO CARB STEADY,) LIQD Take 237 mLs by mouth 2 (two) times daily between meals. 60 Can 0  . Nutritional Supplements (PROMOD) LIQD Take 60 mLs by mouth daily.    . pantoprazole (PROTONIX) 40 MG tablet Take 1 tablet (40 mg total) by mouth daily. 30 tablet 0  . QUEtiapine (SEROQUEL) 25 MG tablet Take 25 mg by mouth at bedtime.    . senna-docusate (SENOKOT-S) 8.6-50 MG tablet Take 2 tablets by mouth at bedtime.     . sevelamer carbonate (RENVELA) 2.4 g PACK Take 2.4 g by mouth 3 (three) times daily with meals.    . Zinc Oxide (DESITIN) 13 % CREA Apply 1 application topically 2 (two) times daily. Apply to proximal, medial, inner thigh on left thigh twice daily.    Marland Kitchen aspirin EC 81 MG tablet Take 81 mg by mouth daily.    Marland Kitchen  HYDROcodone-acetaminophen (NORCO) 5-325 MG tablet Take 1 tablet by mouth every 6 (six) hours as needed for moderate pain. 30 tablet 0  . ipratropium-albuterol (DUONEB) 0.5-2.5 (3) MG/3ML SOLN Take 3 mLs by nebulization every 4 (four) hours as needed. 360 mL 0  . lactulose (CHRONULAC) 10 GM/15ML solution Take 45 mLs (30 g total) by mouth 2 (two) times daily. 240 mL 0  . lidocaine (LIDODERM) 5 % Place 1 patch onto the skin daily as needed (for pain in lower back as needed). Remove & Discard patch within 12 hours or as directed by MD    . oxyCODONE (OXY IR/ROXICODONE) 5 MG immediate release tablet Take 1-2 tablets (5-10 mg total) by mouth every 6 (six) hours as needed for moderate pain or severe pain. (Patient not taking: Reported on 10/15/2018) 30 tablet 0  . oxyCODONE-acetaminophen (ENDOCET) 5-325 MG tablet Take 1-2 tablets by mouth every 6 (six) hours as needed for severe pain. (Patient not taking: Reported on 10/15/2018) 30 tablet 0  . promethazine (PHENERGAN) 12.5 MG tablet Take 12.5 mg by mouth every 6 (six) hours as needed for  nausea or vomiting.     Marland Kitchen rOPINIRole (REQUIP) 0.25 MG tablet Take 0.25 mg by mouth at bedtime.    . torsemide (DEMADEX) 100 MG tablet Take 100 mg by mouth daily.    Marland Kitchen valproic acid (DEPAKENE) 250 MG capsule Take 2 capsules (500 mg total) by mouth 3 (three) times daily. (Patient not taking: Reported on 10/15/2018) 30 capsule 0   No current facility-administered medications on file prior to visit.     There are no Patient Instructions on file for this visit. No follow-ups on file.   Kris Hartmann, NP  This note was completed with Sales executive.  Any errors are purely unintentional.

## 2018-11-04 ENCOUNTER — Encounter (INDEPENDENT_AMBULATORY_CARE_PROVIDER_SITE_OTHER): Payer: Medicare (Managed Care)

## 2018-11-04 ENCOUNTER — Ambulatory Visit (INDEPENDENT_AMBULATORY_CARE_PROVIDER_SITE_OTHER): Payer: Medicare (Managed Care) | Admitting: Vascular Surgery

## 2018-11-10 ENCOUNTER — Encounter (INDEPENDENT_AMBULATORY_CARE_PROVIDER_SITE_OTHER): Payer: Medicare (Managed Care)

## 2018-11-10 ENCOUNTER — Ambulatory Visit (INDEPENDENT_AMBULATORY_CARE_PROVIDER_SITE_OTHER): Payer: Medicare (Managed Care) | Admitting: Nurse Practitioner

## 2018-11-19 ENCOUNTER — Ambulatory Visit (INDEPENDENT_AMBULATORY_CARE_PROVIDER_SITE_OTHER): Payer: Medicare (Managed Care)

## 2018-11-19 ENCOUNTER — Ambulatory Visit (INDEPENDENT_AMBULATORY_CARE_PROVIDER_SITE_OTHER): Payer: Medicare (Managed Care) | Admitting: Nurse Practitioner

## 2018-11-19 ENCOUNTER — Encounter (INDEPENDENT_AMBULATORY_CARE_PROVIDER_SITE_OTHER): Payer: Self-pay | Admitting: Nurse Practitioner

## 2018-11-19 VITALS — BP 154/88 | HR 93 | Resp 18 | Ht 62.0 in | Wt 260.0 lb

## 2018-11-19 DIAGNOSIS — E785 Hyperlipidemia, unspecified: Secondary | ICD-10-CM

## 2018-11-19 DIAGNOSIS — Z992 Dependence on renal dialysis: Secondary | ICD-10-CM | POA: Diagnosis not present

## 2018-11-19 DIAGNOSIS — K219 Gastro-esophageal reflux disease without esophagitis: Secondary | ICD-10-CM | POA: Diagnosis not present

## 2018-11-19 DIAGNOSIS — N186 End stage renal disease: Secondary | ICD-10-CM | POA: Diagnosis not present

## 2018-11-19 MED ORDER — CAMPHOR-MENTHOL 0.5-0.5 % EX LOTN: 1.0000 "application " | TOPICAL_LOTION | CUTANEOUS | 3 refills | Status: AC

## 2018-11-23 ENCOUNTER — Encounter (INDEPENDENT_AMBULATORY_CARE_PROVIDER_SITE_OTHER): Payer: Self-pay

## 2018-11-25 ENCOUNTER — Encounter (INDEPENDENT_AMBULATORY_CARE_PROVIDER_SITE_OTHER): Payer: Self-pay | Admitting: Nurse Practitioner

## 2018-11-25 NOTE — Progress Notes (Signed)
Subjective:    Patient ID: Sarah Hamilton, female    DOB: [birth year], 73 y.o.   MRN: [id number] Chief Complaint  Patient presents with  . Follow-up    HPI  Sarah Hamilton is a 73 y.o. female The patient returns to the office for followup status post intervention of the dialysis access left forearm loop graft. Following the intervention the access function has significantly improved, with better flow rates and improved KT/V. The patient has not been experiencing increased bleeding times following decannulation and the patient denies increased recirculation. The patient denies an increase in arm swelling. At the present time the patient denies hand pain.  The patient denies amaurosis fugax or recent TIA symptoms. There are no recent neurological changes noted. The patient denies claudication symptoms or rest pain symptoms. The patient denies history of DVT, PE or superficial thrombophlebitis. The patient denies recent episodes of angina or shortness of breath.   The patient's non-invasive studies reveal a flow volume of 2107, with no hemodynamically significant velocity increases.  There was also a hematoma seen that measured 1.89 x 2.89cm.     Past Medical History:  Diagnosis Date  . Anemia   . Anginal pain ([place])   . Arthritis   . CHF (congestive heart failure) ([place])   . CKD (chronic kidney disease), stage IV ([place])   . Coronary artery disease   . Dementia ([place])   . Depression   . Diverticulitis   . Diverticulosis   . Dysphagia   . Dyspnea   . Edema    FEET/LEGS  . GERD (gastroesophageal reflux disease)   . Gout   . Heart murmur   . Hepatitis    B  . Hyperlipidemia   . Hyperparathyroidism ([place])   . Hypertension   . Hypothyroidism   . Orthopnea   . Pain    CHRONIC PAIN SYNDROME  . Palpitations   . Restless leg syndrome   . Seizures ([place])    last seizure end of November 2018  . Spinal stenosis   . Tremor    ESSENTIAL  . Wheezing     Past Surgical  History:  Procedure Laterality Date  . A/V SHUNTOGRAM Left 09/10/2018   Procedure: A/V SHUNTOGRAM;  Surgeon: Algernon Huxley, MD;  Location: [facility];  Service: Cardiovascular;  Laterality: Left;  . ABDOMINAL HYSTERECTOMY    . AV FISTULA PLACEMENT Right 10/16/2017   Procedure: ARTERIOVENOUS (AV) FISTULA CREATION (BRACHIOCEPHALIC);  Surgeon: Algernon Huxley, MD;  Location: [facility];  Service: Vascular;  Laterality: Right;  . AV FISTULA PLACEMENT Left 08/06/2018   Procedure: INSERTION OF ARTERIOVENOUS (AV) GORE-TEX GRAFT ARM;  Surgeon: Algernon Huxley, MD;  Location: [facility];  Service: Vascular;  Laterality: Left;  . BASCILIC VEIN TRANSPOSITION Right 03/12/2018   Procedure: BASCILIC VEIN TRANSPOSITION;  Surgeon: Algernon Huxley, MD;  Location: [facility];  Service: Vascular;  Laterality: Right;  . CATARACT EXTRACTION W/PHACO Left 11/07/2016   Procedure: CATARACT EXTRACTION PHACO AND INTRAOCULAR LENS PLACEMENT (IOC);  Surgeon: Eulogio Bear, MD;  Location: [facility];  Service: Ophthalmology;  Laterality: Left;  Lot # [id number] H [place]: 00:38.0 AP%: 10.1 CDE: 3.86  . CATARACT EXTRACTION W/PHACO Right 01/09/2017   Procedure: CATARACT EXTRACTION PHACO AND INTRAOCULAR LENS PLACEMENT (IOC);  Surgeon: Eulogio Bear, MD;  Location: [facility];  Service: Ophthalmology;  Laterality: Right;  [place] 54.8 AP% 5.4 CDE 2.96 Fluid pack lot # [id number]  . CHEST SURGERY     BENIGN  TUMOR  . CHOLECYSTECTOMY    . CORONARY ANGIOPLASTY     STENT  . DIALYSIS/PERMA CATHETER INSERTION N/A 11/11/2017   Procedure: DIALYSIS/PERMA CATHETER INSERTION;  Surgeon: Katha Cabal, MD;  Location: [facility];  Service: Cardiovascular;  Laterality: N/A;  . DIALYSIS/PERMA CATHETER INSERTION N/A 11/17/2017   Procedure: DIALYSIS/PERMA CATHETER INSERTION;  Surgeon: Algernon Huxley, MD;  Location: [facility];  Service: Cardiovascular;  Laterality: N/A;  . EYE SURGERY    . JOINT REPLACEMENT Bilateral    TKR  . SHOULDER  SURGERY Left    shoulder replacement  . ULTRASOUND GUIDANCE FOR VASCULAR ACCESS  10/16/2017   Procedure: ULTRASOUND GUIDANCE FOR MICROPUNCTURE VENOUS ACCESS;  Surgeon: Algernon Huxley, MD;  Location: [facility];  Service: Vascular;;    Social History   Socioeconomic History  . Marital status: Married    Spouse name: Not on file  . Number of children: Not on file  . Years of education: Not on file  . Highest education level: Not on file  Occupational History  . Not on file  Social Needs  . Financial resource strain: Not on file  . Food insecurity:    Worry: Not on file    Inability: Not on file  . Transportation needs:    Medical: Not on file    Non-medical: Not on file  Tobacco Use  . Smoking status: Former Research scientist (life sciences)  . Smokeless tobacco: Never Used  Substance and Sexual Activity  . Alcohol use: No    Alcohol/week: 0.0 standard drinks  . Drug use: No  . Sexual activity: Not Currently  Lifestyle  . Physical activity:    Days per week: Not on file    Minutes per session: Not on file  . Stress: Not on file  Relationships  . Social connections:    Talks on phone: Not on file    Gets together: Not on file    Attends religious service: Not on file    Active member of club or organization: Not on file    Attends meetings of clubs or organizations: Not on file    Relationship status: Not on file  . Intimate partner violence:    Fear of current or ex partner: Not on file    Emotionally abused: Not on file    Physically abused: Not on file    Forced sexual activity: Not on file  Other Topics Concern  . Not on file  Social History Narrative   Lives at home with her son. Ambulates with a Rollator    Family History  Problem Relation Age of Onset  . CAD Mother   . Hypertension Mother   . Stroke Mother   . Kidney failure Mother   . Cancer Father   . Kidney failure Brother   . Kidney failure Son        on hemodialysis in [place]    Allergies  Allergen Reactions  .  Pregabalin Other (See Comments)    Sleepy  . Azithromycin Other (See Comments)    Unknown  . Cymbalta [Duloxetine Hcl] Other (See Comments)    Per MAR  . Erythromycin Hives     Review of Systems   Review of Systems: Negative Unless Checked Constitutional: [] Weight loss  [] Fever  [] Chills Cardiac: [] Chest pain   []  Atrial Fibrillation  [] Palpitations   [] Shortness of breath when laying flat   [] Shortness of breath with exertion. [] Shortness of breath at rest Vascular:  [] Pain in legs  with walking   [] Pain in legs with standing [] Pain in legs when laying flat   [] Claudication    [] Pain in feet when laying flat    [] History of DVT   [] Phlebitis   [] Swelling in legs   [] Varicose veins   [] Non-healing ulcers Pulmonary:   [] Uses home oxygen   [] Productive cough   [] Hemoptysis   [] Wheeze  [] COPD   [] Asthma Neurologic:  [] Dizziness   [] Seizures  [] Blackouts [] History of stroke   [] History of TIA  [] Aphasia   [] Temporary Blindness   [] Weakness or numbness in arm   [x] Weakness or numbness in leg Musculoskeletal:   [] Joint swelling   [] Joint pain   [] Low back pain  []  History of Knee Replacement [] Arthritis [] back Surgeries  []  Spinal Stenosis    Hematologic:  [] Easy bruising  [] Easy bleeding   [] Hypercoagulable state   [x] Anemic Gastrointestinal:  [] Diarrhea   [] Vomiting  [x] Gastroesophageal reflux/heartburn   [] Difficulty swallowing. [] Abdominal pain Genitourinary:  [x] Chronic kidney disease   [] Difficult urination  [] Anuric   [] Blood in urine [] Frequent urination  [] Burning with urination   [] Hematuria Skin:  [] Rashes   [] Ulcers [] Wounds Psychological:  [] History of anxiety   []  History of major depression  []  Memory Difficulties     Objective:   Physical Exam  BP (!) 154/88 (BP Location: Right Arm, Patient Position: Sitting)   Pulse 93   Resp 18   Ht 5\' 2"  (1.575 m)   Wt 260 lb (117.9 kg)   BMI 47.55 kg/m   Gen: WD/WN, NAD Head: [place]/AT, No temporalis wasting.  Ear/Nose/Throat: Hearing  grossly intact, nares w/o erythema or drainage Eyes: PER, EOMI, sclera nonicteric.  Neck: Supple, no masses.  No JVD.  Pulmonary:  Good air movement, no use of accessory muscles.  Cardiac: RRR Vascular:  Vessel Right Left  Radial Palpable Palpable   Gastrointestinal: soft, non-distended. No guarding/no peritoneal signs.  Musculoskeletal: M/S 5/5 throughout.  No deformity or atrophy.  Neurologic: Pain and light touch intact in extremities.  Symmetrical.  Speech is fluent. Motor exam as listed above. Psychiatric: Judgment intact, Mood & affect appropriate for pt's clinical situation. Dermatologic: No Venous rashes. No Ulcers Noted.  No changes consistent with cellulitis. Lymph : No Cervical lymphadenopathy, no lichenification or skin changes of chronic lymphedema.      Assessment & Plan:   1. ESRD on dialysis [facility]) Recommend:  The patient is doing well and currently has adequate dialysis access. The patient's dialysis center is not reporting any access issues. Flow pattern is stable when compared to the prior ultrasound.   The patient has a perm cath currently and can be removed once the dialysis center feels her access is performing on a reliable basis.   The patient is also relocating to [place] in 1 month.  Patient was advised to contact us once she has located someone to resume her care so that we can send the appropriate records.      2. Gastroesophageal reflux disease, esophagitis presence not specified Continue PPI as already ordered, this medication has been reviewed and there are no changes at this time.  Avoidence of caffeine and alcohol  Moderate elevation of the head of the bed   3. Hyperlipidemia, unspecified hyperlipidemia type Continue statin as ordered and reviewed, no changes at this time    Current Outpatient Medications on File Prior to Visit  Medication Sig Dispense Refill  . acetaminophen (TYLENOL) 650 MG CR tablet Take 1,300 mg by mouth 2 (two)  times  daily.     . atorvastatin (LIPITOR) 20 MG tablet Take 20 mg by mouth at bedtime.    . carvedilol (COREG) 3.125 MG tablet Take 1 tablet (3.125 mg total) by mouth 2 (two) times daily with a meal. 30 tablet 0  . Chloroxylenol-Zinc Oxide (BAZA EX) Place 1 application rectally 5 (five) times daily as needed (for skin breakdown).    Marland Kitchen divalproex (DEPAKOTE) 500 MG DR tablet Take 500 mg by mouth 3 (three) times daily.     . febuxostat (ULORIC) 40 MG tablet Take 40 mg by mouth daily.    Marland Kitchen HYDROcodone-acetaminophen (NORCO) 5-325 MG tablet Take 1 tablet by mouth every 6 (six) hours as needed for moderate pain. (Patient not taking: Reported on 11/24/2018) 30 tablet 0  . levETIRAcetam (KEPPRA) 500 MG tablet Take 500 mg by mouth See admin instructions. Take 1 tablet (500 mg) by mouth daily for seizures, take an additional dose after dialysis on Monday, Wednesday, &  Friday afternoons.    . lidocaine (LIDODERM) 5 % Place 1 patch onto the skin daily as needed (for pain in lower back as needed). Remove & Discard patch within 12 hours or as directed by MD    . lidocaine-prilocaine (EMLA) cream Apply 1 application topically 3 (three) times daily as needed (prior to port access with dialysis).     . nitroGLYCERIN (NITROSTAT) 0.4 MG SL tablet Place 0.4 mg under the tongue every 5 (five) minutes x 3 doses as needed for chest pain.     . Nutritional Supplements (PROMOD) LIQD Take 60 mLs by mouth daily.    . pantoprazole (PROTONIX) 40 MG tablet Take 1 tablet (40 mg total) by mouth daily. 30 tablet 0  . senna-docusate (SENOKOT-S) 8.6-50 MG tablet Take 2 tablets by mouth 2 (two) times daily.     . sevelamer carbonate (RENVELA) 2.4 g PACK Take 4.8 g by mouth 3 (three) times daily with meals.     . Zinc Oxide (DESITIN) 13 % CREA Apply 1 application topically 2 (two) times daily. Apply to proximal, medial, inner thigh on left thigh twice daily.     No current facility-administered medications on file prior to visit.      There are no Patient Instructions on file for this visit. No follow-ups on file.   Kris Hartmann, NP  This note was completed with Sales executive.  Any errors are purely unintentional.

## 2018-11-27 ENCOUNTER — Other Ambulatory Visit (INDEPENDENT_AMBULATORY_CARE_PROVIDER_SITE_OTHER): Payer: Self-pay | Admitting: Nurse Practitioner

## 2018-11-30 ENCOUNTER — Telehealth (INDEPENDENT_AMBULATORY_CARE_PROVIDER_SITE_OTHER): Payer: Self-pay | Admitting: Vascular Surgery

## 2018-11-30 MED ORDER — CEFAZOLIN SODIUM-DEXTROSE 1-4 GM/50ML-% IV SOLN: 1.0000 g | Freq: Once | INTRAVENOUS | Status: DC

## 2018-11-30 NOTE — Telephone Encounter (Signed)
Spoke with the charge nurse at [facility] and let her know that the patient has no special instructions other than arrival time of 1:00 pm and check in at the [facility] [employer] [facility].

## 2018-11-30 NOTE — Telephone Encounter (Signed)
Please advise facility on question below

## 2018-12-01 ENCOUNTER — Encounter
Admission: RE | Disposition: A | Discharge status: Home / Self Care | Payer: Self-pay | Source: Home / Self Care | Attending: Vascular Surgery

## 2018-12-01 ENCOUNTER — Ambulatory Visit
Admission: RE | Admit: 2018-12-01 | Discharge: 2018-12-01 | Disposition: A | Discharge status: Home / Self Care | Payer: Medicare (Managed Care) | Attending: Vascular Surgery | Admitting: Vascular Surgery

## 2018-12-01 ENCOUNTER — Other Ambulatory Visit: Payer: Self-pay

## 2018-12-01 ENCOUNTER — Encounter: Payer: Self-pay | Admitting: Vascular Surgery

## 2018-12-01 DIAGNOSIS — T829XXA Unspecified complication of cardiac and vascular prosthetic device, implant and graft, initial encounter: Secondary | ICD-10-CM

## 2018-12-01 DIAGNOSIS — N186 End stage renal disease: Secondary | ICD-10-CM | POA: Insufficient documentation

## 2018-12-01 DIAGNOSIS — Z992 Dependence on renal dialysis: Secondary | ICD-10-CM | POA: Diagnosis not present

## 2018-12-01 DIAGNOSIS — E785 Hyperlipidemia, unspecified: Secondary | ICD-10-CM | POA: Diagnosis not present

## 2018-12-01 DIAGNOSIS — Z4901 Encounter for fitting and adjustment of extracorporeal dialysis catheter: Secondary | ICD-10-CM | POA: Diagnosis present

## 2018-12-01 DIAGNOSIS — K219 Gastro-esophageal reflux disease without esophagitis: Secondary | ICD-10-CM | POA: Insufficient documentation

## 2018-12-01 DIAGNOSIS — Z79899 Other long term (current) drug therapy: Secondary | ICD-10-CM | POA: Diagnosis not present

## 2018-12-01 DIAGNOSIS — Z452 Encounter for adjustment and management of vascular access device: Secondary | ICD-10-CM

## 2018-12-01 HISTORY — PX: DIALYSIS/PERMA CATHETER REMOVAL: CATH118289

## 2018-12-01 SURGERY — DIALYSIS/PERMA CATHETER REMOVAL
Anesthesia: LOCAL

## 2018-12-01 MED ORDER — MIDAZOLAM HCL 2 MG/ML PO SYRP: 8.0000 mg | ORAL_SOLUTION | Freq: Once | ORAL | Status: DC | PRN

## 2018-12-01 MED ORDER — HYDROMORPHONE HCL 1 MG/ML IJ SOLN: 1.0000 mg | Freq: Once | INTRAMUSCULAR | Status: DC | PRN

## 2018-12-01 MED ORDER — METHYLPREDNISOLONE SODIUM SUCC 125 MG IJ SOLR: 125.0000 mg | Freq: Once | INTRAMUSCULAR | Status: DC | PRN

## 2018-12-01 MED ORDER — ONDANSETRON HCL 4 MG/2ML IJ SOLN: 4.0000 mg | Freq: Four times a day (QID) | INTRAMUSCULAR | Status: DC | PRN

## 2018-12-01 MED ORDER — FAMOTIDINE 20 MG PO TABS: 40.0000 mg | ORAL_TABLET | Freq: Once | ORAL | Status: DC | PRN

## 2018-12-01 MED ORDER — DIPHENHYDRAMINE HCL 50 MG/ML IJ SOLN: 50.0000 mg | Freq: Once | INTRAMUSCULAR | Status: DC | PRN

## 2018-12-01 SURGICAL SUPPLY — 4 items
DERMABOND ADVANCED (GAUZE/BANDAGES/DRESSINGS) ×1
DERMABOND ADVANCED .7 DNX12 (GAUZE/BANDAGES/DRESSINGS) ×1 IMPLANT
SUT MNCRL AB 4-0 PS2 18 (SUTURE) ×2 IMPLANT
TRAY LACERAT/PLASTIC (MISCELLANEOUS) ×2 IMPLANT

## 2018-12-01 NOTE — H&P (Signed)
[facility] History & Physical Update  The patient was interviewed and re-examined.  The patient's previous History and Physical has been reviewed and is unchanged.  There is no change in the plan of care. We plan to proceed with the scheduled procedure.  [name], MD  12/01/2018, 1:30 PM

## 2018-12-01 NOTE — Op Note (Signed)
  OPERATIVE NOTE   PROCEDURE: 1. Removal of a right IJ tunneled dialysis catheter  PRE-OPERATIVE DIAGNOSIS: Complication of dialysis catheter  POST-OPERATIVE DIAGNOSIS: Same  SURGEON: [name]  ANESTHESIA: Local anesthetic with 1% lidocaine with epinephrine   ESTIMATED BLOOD LOSS: Minimal   FINDING(S): 1. Catheter intact   SPECIMEN(S):  Catheter  INDICATIONS:   Sarah Hamilton is a 73 y.o. female who presents with a functioning left forearm loop graft.  DESCRIPTION: After obtaining full informed written consent, the patient was positioned supine. The right IJ catheter and surrounding area is prepped and draped in a sterile fashion. The cuff was localized by palpation and noted to be greater than 3 cm from the exit site. After appropriate timeout is called, 1% lidocaine with epinephrine is infiltrated into the surrounding tissues around the cuff. Small transverse incision is created with an 11 blade scalpel and the dissection was carried down to expose the cuff of the tunneled catheter.  The catheter is then freed from the surrounding attachments and adhesions. Once the catheter has been freed circumferentially it is transected just distal to the cuff and subsequently removed in 2 pieces. Light pressure was held at the base of the neck. A 4-0 Monocryl was used close the tunnel in the subcutaneous space. The 4-0 Monocryl Monocryl was then used to close the skin in a subcuticular stitch. Dermabond is applied.  Antibiotic ointment and a sterile dressing is applied to the exit site. Patient tolerated procedure well and there were no complications.  COMPLICATIONS: None  CONDITION: Unchanged  [name]. [facility] Office: [phone]  12/01/2018,2:21 PM

## 2018-12-01 NOTE — Progress Notes (Signed)
Perm Cath removed by Dr. Delana Meyer at bedside. Pt. Tolerated well. Sterile Tegaderm to site. Call to [facility] for transport. Nurse tech. With pt. From facility.

## 2019-12-09 IMAGING — CR DG CHEST 2V
1 series · 2 of 2 positions shown · non-contrast
Comparison: 01/14/2016

CLINICAL DATA: 15 pound weight gain during the past week. History
of end-stage renal disease.

EXAM:
CHEST  2 VIEW

[Series 1: dg chest 2 view · 0.14mm/px · 2 of 2 slices shown]
[im 1/2]
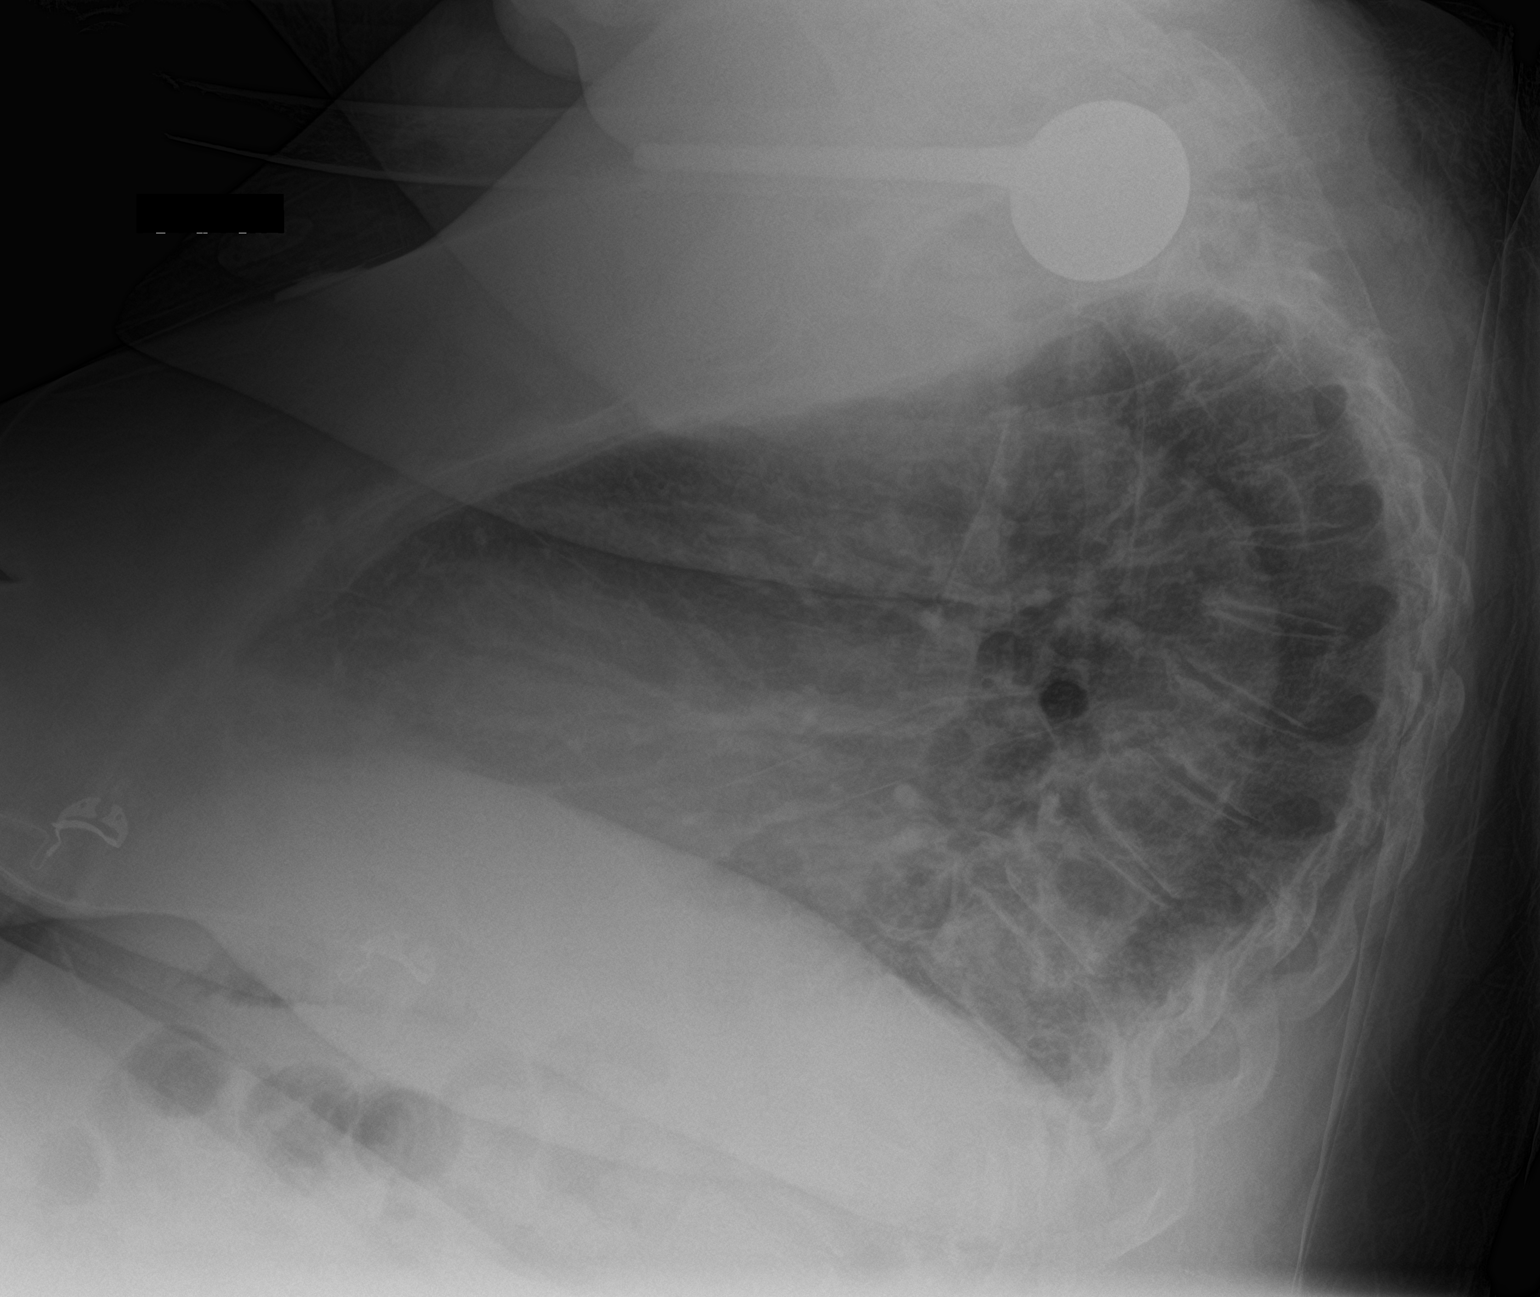
[im 2/2]
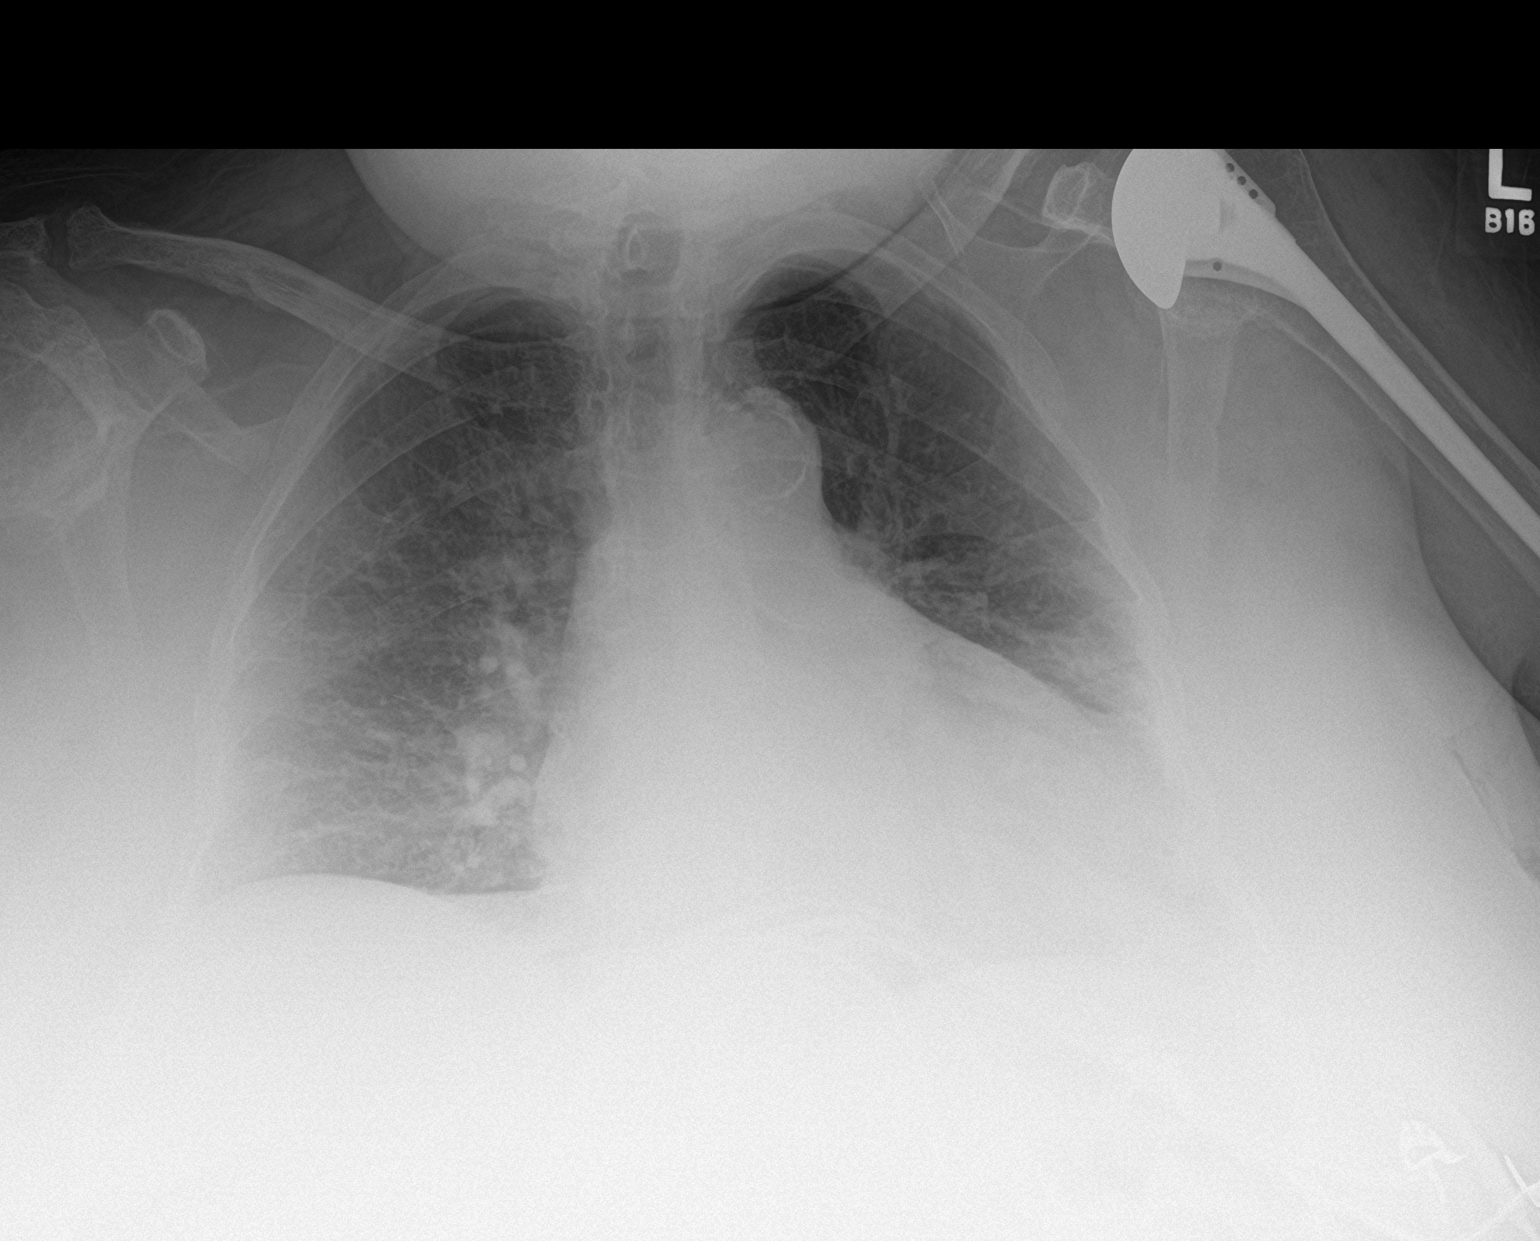

[2 of 2 positions shown; findings below may reference images not displayed]

FINDINGS: Examination is degraded due to patient body habitus.

Grossly unchanged enlarged cardiac silhouette and mediastinal
contours given persistently reduced lung volumes. Atherosclerotic
plaque within the thoracic aorta. Mild pulmonary venous congestion
without frank evidence of edema. Bibasilar heterogeneous opacities
are unchanged and favored to represent atelectasis. No new focal
airspace opacities. No pleural effusion or pneumothorax. No definite
acute osseus abnormalities. Accentuated thoracic kyphosis. Post left
hemi humeral arthroplasty, incompletely evaluated.
IMPRESSION: Similar findings of cardiomegaly and pulmonary venous congestion
without definitive superimposed acute cardiopulmonary disease on
this hypoventilated, body habitus degraded examination.
Specifically, no definite evidence of pulmonary edema.
